# Patient Record
Sex: Female | Born: 1945 | Race: Black or African American | Hispanic: No | Smoking: Current every day smoker
Health system: Southern US, Community
[De-identification: ages and names within clinical notes are randomized; demographics above are authoritative.]

## PROBLEM LIST (undated history)

## (undated) DIAGNOSIS — D510 Vitamin B12 deficiency anemia due to intrinsic factor deficiency: Secondary | ICD-10-CM

## (undated) DIAGNOSIS — I10 Essential (primary) hypertension: Secondary | ICD-10-CM

## (undated) HISTORY — DX: Essential (primary) hypertension: I10

## (undated) HISTORY — DX: Vitamin B12 deficiency anemia due to intrinsic factor deficiency: D51.0

---

## 2016-08-10 ENCOUNTER — Observation Stay (HOSPITAL_COMMUNITY): Payer: Medicare Other

## 2016-08-10 ENCOUNTER — Inpatient Hospital Stay (HOSPITAL_COMMUNITY)
Admission: EM | Admit: 2016-08-10 | Discharge: 2016-08-12 | DRG: 812 | Disposition: A | Discharge status: Home Health | Payer: Medicare Other | Attending: Internal Medicine | Admitting: Internal Medicine

## 2016-08-10 DIAGNOSIS — D510 Vitamin B12 deficiency anemia due to intrinsic factor deficiency: Principal | ICD-10-CM | POA: Diagnosis present

## 2016-08-10 DIAGNOSIS — F17210 Nicotine dependence, cigarettes, uncomplicated: Secondary | ICD-10-CM | POA: Diagnosis present

## 2016-08-10 DIAGNOSIS — D649 Anemia, unspecified: Secondary | ICD-10-CM | POA: Diagnosis present

## 2016-08-10 DIAGNOSIS — M7989 Other specified soft tissue disorders: Secondary | ICD-10-CM | POA: Diagnosis present

## 2016-08-10 DIAGNOSIS — R000 Tachycardia, unspecified: Secondary | ICD-10-CM | POA: Diagnosis present

## 2016-08-10 DIAGNOSIS — Z882 Allergy status to sulfonamides status: Secondary | ICD-10-CM

## 2016-08-10 DIAGNOSIS — Z7982 Long term (current) use of aspirin: Secondary | ICD-10-CM

## 2016-08-10 LAB — IRON AND TIBC
Iron: 139 ug/dL (ref 28–170)
Saturation Ratios: 46 % — ABNORMAL HIGH (ref 10.4–31.8)
TIBC: 300 ug/dL (ref 250–450)
UIBC: 161 ug/dL

## 2016-08-10 LAB — RETICULOCYTES
RBC.: 0.99 MIL/uL — AB (ref 3.87–5.11)
RETIC COUNT ABSOLUTE: 27.7 10*3/uL (ref 19.0–186.0)
RETIC CT PCT: 2.8 % (ref 0.4–3.1)

## 2016-08-10 LAB — TYPE AND SCREEN
ABO/RH(D): O POS
Antibody Screen: NEGATIVE
UNIT DIVISION: 0
UNIT DIVISION: 0
Unit division: 0

## 2016-08-10 LAB — CBC
HEMATOCRIT: 12 % — AB (ref 36.0–46.0)
HEMOGLOBIN: 4 g/dL — AB (ref 12.0–15.0)
MCH: 39.6 pg — ABNORMAL HIGH (ref 26.0–34.0)
MCHC: 33.3 g/dL (ref 30.0–36.0)
MCV: 118.8 fL — ABNORMAL HIGH (ref 78.0–100.0)
Platelets: 101 10*3/uL — ABNORMAL LOW (ref 150–400)
RBC: 1.01 MIL/uL — ABNORMAL LOW (ref 3.87–5.11)
RDW: 35 % — AB (ref 11.5–15.5)
WBC: 6.7 10*3/uL (ref 4.0–10.5)

## 2016-08-10 LAB — URINALYSIS, ROUTINE W REFLEX MICROSCOPIC
BILIRUBIN URINE: NEGATIVE
Glucose, UA: NEGATIVE mg/dL
Hgb urine dipstick: NEGATIVE
KETONES UR: NEGATIVE mg/dL
LEUKOCYTES UA: NEGATIVE
NITRITE: NEGATIVE
Protein, ur: NEGATIVE mg/dL
Specific Gravity, Urine: 1.01 (ref 1.005–1.030)
pH: 6 (ref 5.0–8.0)

## 2016-08-10 LAB — BASIC METABOLIC PANEL
ANION GAP: 8 (ref 5–15)
BUN: 11 mg/dL (ref 6–20)
CALCIUM: 10.3 mg/dL (ref 8.9–10.3)
CO2: 21 mmol/L — ABNORMAL LOW (ref 22–32)
Chloride: 109 mmol/L (ref 101–111)
Creatinine, Ser: 1.09 mg/dL — ABNORMAL HIGH (ref 0.44–1.00)
GFR calc Af Amer: 57 mL/min — ABNORMAL LOW (ref >60)
GFR, EST NON AFRICAN AMERICAN: 49 mL/min — AB (ref >60)
GLUCOSE: 128 mg/dL — AB (ref 65–99)
POTASSIUM: 4.2 mmol/L (ref 3.5–5.1)
SODIUM: 138 mmol/L (ref 135–145)

## 2016-08-10 LAB — PROTIME-INR
INR: 1.07
PROTHROMBIN TIME: 13.9 s (ref 11.4–15.2)

## 2016-08-10 LAB — HEPATIC FUNCTION PANEL
ALBUMIN: 4 g/dL (ref 3.5–5.0)
ALT: 10 U/L — AB (ref 14–54)
AST: 30 U/L (ref 15–41)
Alkaline Phosphatase: 51 U/L (ref 38–126)
BILIRUBIN DIRECT: 0.6 mg/dL — AB (ref 0.1–0.5)
Indirect Bilirubin: 3.5 mg/dL — ABNORMAL HIGH (ref 0.3–0.9)
Total Bilirubin: 4.1 mg/dL — ABNORMAL HIGH (ref 0.3–1.2)
Total Protein: 6.6 g/dL (ref 6.5–8.1)

## 2016-08-10 LAB — BRAIN NATRIURETIC PEPTIDE: B Natriuretic Peptide: 270 pg/mL — ABNORMAL HIGH (ref 0.0–100.0)

## 2016-08-10 LAB — BPAM RBC
BLOOD PRODUCT EXPIRATION DATE: 201808302359
BLOOD PRODUCT EXPIRATION DATE: 201808302359
BLOOD PRODUCT EXPIRATION DATE: 201808312359
ISSUE DATE / TIME: 201808012228
Unit Type and Rh: 5100
Unit Type and Rh: 5100
Unit Type and Rh: 5100

## 2016-08-10 LAB — PREPARE RBC (CROSSMATCH)

## 2016-08-10 LAB — ABO/RH: ABO/RH(D): O POS

## 2016-08-10 LAB — VITAMIN B12: Vitamin B-12: <50 pg/mL — ABNORMAL LOW (ref 180–914)

## 2016-08-10 LAB — I-STAT TROPONIN, ED: Troponin i, poc: 0.04 ng/mL (ref 0.00–0.08)

## 2016-08-10 LAB — ETHANOL

## 2016-08-10 LAB — FOLATE: Folate: 10.9 ng/mL (ref >5.9)

## 2016-08-10 LAB — FERRITIN: FERRITIN: 163 ng/mL (ref 11–307)

## 2016-08-10 LAB — POC OCCULT BLOOD, ED: Fecal Occult Bld: NEGATIVE

## 2016-08-10 LAB — LIPASE, BLOOD: Lipase: 29 U/L (ref 11–51)

## 2016-08-10 MED ORDER — SODIUM CHLORIDE 0.9 % IV SOLN: 10.0000 mL/h | Freq: Once | INTRAVENOUS | Status: DC

## 2016-08-10 MED ORDER — ACETAMINOPHEN 325 MG PO TABS
650.0000 mg | ORAL_TABLET | Freq: Four times a day (QID) | ORAL | Status: DC | PRN
  Administered 2016-08-10: 650 mg via ORAL
  Filled 2016-08-10: qty 2

## 2016-08-10 MED ORDER — ACETAMINOPHEN 650 MG RE SUPP: 650.0000 mg | Freq: Four times a day (QID) | RECTAL | Status: DC | PRN

## 2016-08-10 MED ORDER — SODIUM CHLORIDE 0.9% FLUSH: 3.0000 mL | INTRAVENOUS | Status: DC | PRN

## 2016-08-10 MED ORDER — SODIUM CHLORIDE 0.9% FLUSH
3.0000 mL | Freq: Two times a day (BID) | INTRAVENOUS | Status: DC
  Administered 2016-08-10 – 2016-08-12 (×4): 3 mL via INTRAVENOUS

## 2016-08-10 MED ORDER — SODIUM CHLORIDE 0.9 % IV SOLN: 250.0000 mL | INTRAVENOUS | Status: DC | PRN

## 2016-08-10 NOTE — ED Notes (Signed)
MD at bedside. 

## 2016-08-10 NOTE — H&P (Signed)
History and Physical    Danielle Zamora KWI:[id number] DOB: November 05, 1945 DOA: 08/10/2016  PCP: Patient, No Pcp Per  Patient coming from: Home  Chief Complaint: Weakness upon standing, shortness of breath, elevated heart rate  HPI: Danielle Zamora is a 71 y.o. female with medical history significant of tobacco dependence. Presenting after developing complaints listed above. This has been going on for the last several days. She denies any hematuria or bright red blood per rectum. No abdominal discomfort either. The problem has been persistent and gradually getting worse. She reports that when she went to standing or with activity she would get short of breath and feel her heart rate beating fast. Resting would help.  ED Course: Patient was found to have profound anemia were consulted for further evaluation recommendations.  Review of Systems: As per HPI otherwise 10 point review of systems negative.   No past medical history -  reported patient reported she does not follow up regularly with physicians.  No past surgical history reported   has no tobacco, alcohol, and drug history on file.  Allergies  Allergen Reactions  . Penicillins Swelling    Has patient had a PCN reaction causing immediate rash, facial/tongue/throat swelling, SOB or lightheadedness with hypotension: Yes Has patient had a PCN reaction causing severe rash involving mucus membranes or skin necrosis: Yes Has patient had a PCN reaction that required hospitalization: No Has patient had a PCN reaction occurring within the last 10 years: No If all of the above answers are "NO", then may proceed with Cephalosporin use.     No family history on file.   Prior to Admission medications   Medication Sig Start Date End Date Taking? Authorizing Provider  aspirin EC 325 MG tablet Take 325 mg by mouth daily.   Yes [provider]    Physical Exam: Vitals:   08/10/16 1429 08/10/16 1700 08/10/16 1745  BP: (!) 179/76 (!)  173/63 (!) 162/66  Pulse: (!) 106    Resp: 16 18 18   Temp: 98.6 F (37 C)    TempSrc: Oral    SpO2: 95%      Constitutional: NAD, calm, comfortable Vitals:   08/10/16 1429 08/10/16 1700 08/10/16 1745  BP: (!) 179/76 (!) 173/63 (!) 162/66  Pulse: (!) 106    Resp: 16 18 18   Temp: 98.6 F (37 C)    TempSrc: Oral    SpO2: 95%     Eyes: PERRL, lids and conjunctivae normal. Conjunctival pallor ENMT: Mucous membranes are moist. Posterior pharynx clear of any exudate or lesions.Normal dentition.  Neck: normal, supple, no masses, no thyromegaly Respiratory: clear to auscultation bilaterally, no wheezing, no crackles. Normal respiratory effort. No accessory muscle use.  Cardiovascular: Regular rate and rhythm, no  rubs / gallops. No extremity edema. Abdomen: no tenderness, no masses palpated. No hepatosplenomegaly. Bowel sounds positive.  Musculoskeletal:  No joint deformity upper and lower extremities.  Skin: no rashes, lesions, ulcers. No induration, limited exam Neurologic: Awake and alert, no facial asymmetry, answers questions appropriately    Labs on Admission: I have personally reviewed following labs and imaging studies  CBC:  Recent Labs Lab 08/10/16 1503  WBC 6.7  HGB 4.0*  HCT 12.0*  MCV 118.8*  PLT 242*   Basic Metabolic Panel:  Recent Labs Lab 08/10/16 1503  NA 138  K 4.2  CL 109  CO2 21*  GLUCOSE 128*  BUN 11  CREATININE 1.09*  CALCIUM 10.3   GFR: CrCl cannot be calculated (Unknown ideal  weight.). Liver Function Tests: No results for input(s): AST, ALT, ALKPHOS, BILITOT, PROT, ALBUMIN in the last 168 hours. No results for input(s): LIPASE, AMYLASE in the last 168 hours. No results for input(s): AMMONIA in the last 168 hours. Coagulation Profile:  Recent Labs Lab 08/10/16 1711  INR 1.07   Cardiac Enzymes: No results for input(s): CKTOTAL, CKMB, CKMBINDEX, TROPONINI in the last 168 hours. BNP (last 3 results) No results for input(s): PROBNP  in the last 8760 hours. HbA1C: No results for input(s): HGBA1C in the last 72 hours. CBG: No results for input(s): GLUCAP in the last 168 hours. Lipid Profile: No results for input(s): CHOL, HDL, LDLCALC, TRIG, CHOLHDL, LDLDIRECT in the last 72 hours. Thyroid Function Tests: No results for input(s): TSH, T4TOTAL, FREET4, T3FREE, THYROIDAB in the last 72 hours. Anemia Panel: No results for input(s): VITAMINB12, FOLATE, FERRITIN, TIBC, IRON, RETICCTPCT in the last 72 hours. Urine analysis:    Component Value Date/Time   COLORURINE YELLOW 08/10/2016 1522   APPEARANCEUR CLEAR 08/10/2016 1522   LABSPEC 1.010 08/10/2016 1522   PHURINE 6.0 08/10/2016 1522   GLUCOSEU NEGATIVE 08/10/2016 1522   HGBUR NEGATIVE 08/10/2016 1522   BILIRUBINUR NEGATIVE 08/10/2016 [place] 08/10/2016 1522   PROTEINUR NEGATIVE 08/10/2016 1522   NITRITE NEGATIVE 08/10/2016 1522   LEUKOCYTESUR NEGATIVE 08/10/2016 1522    Radiological Exams on Admission: No results found.  EKG: Independently reviewed. Sinus tachycardia with T-wave inversion in lateral leads  Assessment/Plan Active Problems:   Severe anemia - Etiology uncertain. However it is concerning since patient reports history of smoking and no consistent follow-up with physicians as outpatient. She will most certainly need a colonoscopy. No active bleeding currently. After transfusion I suspect patient continued be discharged follow-up to gastroenterologist. - Place order for anemia panel  DVT prophylaxis: SCD's Code Status: full Family Communication: no family at bedside Disposition Plan: Pending improvement in condition Consults called: None Admission status: Observation   Velvet Bathe MD [facility] Pager [phone]  If 7PM-7AM, please contact night-coverage [email or web address] Password [email or web address]  08/10/2016, 6:09 PM

## 2016-08-10 NOTE — ED Provider Notes (Signed)
[facility] DEPT Provider Note   CSN: [id number] Arrival date & time: 08/10/16  1421     History   Chief Complaint Chief Complaint  Patient presents with  . Weakness  . Memory Loss    HPI Danielle Zamora is a 71 y.o. female.  HPI She lives independently. She has lived in the [place] area for over 10 years. Patient does not seek medical care. She is presenting today with her niece. Her niece does not see her on a regular basis. Her niece advises she saw her probably couple months ago. The patient's niece reports that her uncle, the patient's brother, called her to bring her into the emergency department because the patient had called him to pick her up from [facility] and she seemed weak and somewhat confused. The patient denies any confusion. She does report that for a few weeks to months now she has not been feeling "up to par". She reports normally she is pretty active but lately she is needed to rest more and had less inclination to travel or do activities. She reports that she gets pretty short of breath with what used to be fairly minor exertion. She reports she has to sit and rest a while and then resume her activities. She denies chest pain. Some shortness of breath with exertion. She is also noting that she's developed some swelling in her legs with a little hyperpigmentation. He denies pain in the legs. She denies recent fever or chills. She reports she does smoke regularly. Usually less than a pack a day. She has some amount of alcohol on a daily basis. She reports often is less than a glass but typically something. No past medical history on file.  There are no active problems to display for this patient.   No past surgical history on file.  OB History    No data available       Home Medications    Prior to Admission medications   Medication Sig Start Date End Date Taking? Authorizing Provider  aspirin EC 325 MG tablet Take 325 mg by mouth daily.   Yes [provider]    Family History No family history on file.  Social History Social History  Substance Use Topics  . Smoking status: Not on file  . Smokeless tobacco: Not on file  . Alcohol use Not on file     Allergies   Penicillins   Review of Systems Review of Systems 10 Systems reviewed and are negative for acute change except as noted in the HPI.   Physical Exam Updated Vital Signs BP (!) 179/76 (BP Location: Right Arm)   Pulse (!) 106   Temp 98.6 F (37 C) (Oral)   Resp 16   SpO2 95%   Physical Exam  Constitutional: She is oriented to person, place, and time.  Patient is alert and nontoxic. No respiratory distress at rest. Mental status clear.  HENT:  Head: Normocephalic and atraumatic.  Mouth/Throat: Oropharynx is clear and moist.  Patient wears dentures.  Eyes: Pupils are equal, round, and reactive to light. EOM are normal.  Appearance of mild icterus.  Neck: Neck supple.  Cardiovascular:  Tachycardia. Monitor shows sinus rhythm 102. Systolic ejection murmur 2\6.  Pulmonary/Chest: Effort normal and breath sounds normal.  Abdominal: Soft. Bowel sounds are normal. She exhibits no distension. There is no tenderness. There is no guarding.  Musculoskeletal: Normal range of motion.  Trace to 1+ pitting edema bilateral lower extremities. Calves nontender. Mild hyperpigmentation of  the pretibial surfaces. Skin condition good to fair no ulcers or wounds.  Neurological: She is alert and oriented to person, place, and time. No cranial nerve deficit. She exhibits normal muscle tone. Coordination normal.  Skin: Skin is warm and dry.  Psychiatric: She has a normal mood and affect.     ED Treatments / Results  Labs (all labs ordered are listed, but only abnormal results are displayed) Labs Reviewed  BASIC METABOLIC PANEL - Abnormal; Notable for the following:       Result Value   CO2 21 (*)    Glucose, Bld 128 (*)    Creatinine, Ser 1.09 (*)    GFR calc non Af  Amer 49 (*)    GFR calc Af Amer 57 (*)    All other components within normal limits  CBC - Abnormal; Notable for the following:    RBC 1.01 (*)    Hemoglobin 4.0 (*)    HCT 12.0 (*)    MCV 118.8 (*)    MCH 39.6 (*)    RDW 35.0 (*)    Platelets 101 (*)    All other components within normal limits  URINALYSIS, ROUTINE W REFLEX MICROSCOPIC  ETHANOL  PROTIME-INR  BRAIN NATRIURETIC PEPTIDE  HEPATIC FUNCTION PANEL  LIPASE, BLOOD  POC OCCULT BLOOD, ED  I-STAT TROPONIN, ED  TYPE AND SCREEN  PREPARE RBC (CROSSMATCH)  ABO/RH    EKG  EKG Interpretation  Date/Time:  Wednesday August 10 2016 14:57:09 EDT Ventricular Rate:  102 PR Interval:  150 QRS Duration: 140 QT Interval:  392 QTC Calculation: 510 R Axis:   99 Text Interpretation:  Sinus tachycardia Right bundle branch block T wave abnormality, consider inferolateral ischemia Abnormal ECG agree.no stemi, no old Confirmed by Charlesetta Shanks [phone]) on 08/10/2016 4:46:58 PM       Radiology No results found.  Procedures Procedures (including critical care time) CRITICAL CARE Performed by: Charlesetta Shanks   Total critical care time:30 minutes  Critical care time was exclusive of separately billable procedures and treating other patients.  Critical care was necessary to treat or prevent imminent or life-threatening deterioration.  Critical care was time spent personally by me on the following activities: development of treatment plan with patient and/or surrogate as well as nursing, discussions with consultants, evaluation of patient's response to treatment, examination of patient, obtaining history from patient or surrogate, ordering and performing treatments and interventions, ordering and review of laboratory studies, ordering and review of radiographic studies, pulse oximetry and re-evaluation of patient's condition. Medications Ordered in ED Medications  0.9 %  sodium chloride infusion (not administered)     Initial  Impression / Assessment and Plan / ED Course  I have reviewed the triage vital signs and the nursing notes.  Pertinent labs & imaging results that were available during my care of the patient were reviewed by me and considered in my medical decision making (see chart for details).    Consult hospitalist for admission.  Final Clinical Impressions(s) / ED Diagnoses   Final diagnoses:  Symptomatic anemia   Patient presents with generalized symptoms of increasing fatigue, dyspnea and weakness. She is found to be significantly anemic with a hemoglobin of 4. No evident source of blood loss at this time. She will be admitted for transfusion and further diagnostic evaluation. New Prescriptions New Prescriptions   No medications on file     Charlesetta Shanks, MD 08/10/16 [phone]

## 2016-08-10 NOTE — ED Notes (Signed)
Dr. Vega at bedside. 

## 2016-08-10 NOTE — ED Triage Notes (Signed)
Pt arrives via POv from home with several month hx of fatigue, decreased appetite, difficulty walking and memory loss. Pt alert, oriented x4, no droop, drift or slurred speech noted. Strength equal in all extremites. Denies recent fever, cough, UTI sx. Pt NAD at present. Pt reports no medical hx no PCP in many years.

## 2016-08-11 ENCOUNTER — Encounter (HOSPITAL_COMMUNITY): Payer: Self-pay

## 2016-08-11 DIAGNOSIS — D510 Vitamin B12 deficiency anemia due to intrinsic factor deficiency: Secondary | ICD-10-CM | POA: Diagnosis present

## 2016-08-11 DIAGNOSIS — M7989 Other specified soft tissue disorders: Secondary | ICD-10-CM | POA: Diagnosis present

## 2016-08-11 DIAGNOSIS — F17210 Nicotine dependence, cigarettes, uncomplicated: Secondary | ICD-10-CM | POA: Diagnosis present

## 2016-08-11 DIAGNOSIS — Z882 Allergy status to sulfonamides status: Secondary | ICD-10-CM | POA: Diagnosis not present

## 2016-08-11 DIAGNOSIS — D649 Anemia, unspecified: Secondary | ICD-10-CM

## 2016-08-11 DIAGNOSIS — Z7982 Long term (current) use of aspirin: Secondary | ICD-10-CM | POA: Diagnosis not present

## 2016-08-11 DIAGNOSIS — R000 Tachycardia, unspecified: Secondary | ICD-10-CM | POA: Diagnosis present

## 2016-08-11 LAB — BASIC METABOLIC PANEL
ANION GAP: 8 (ref 5–15)
BUN: 9 mg/dL (ref 6–20)
CO2: 21 mmol/L — ABNORMAL LOW (ref 22–32)
Calcium: 10.1 mg/dL (ref 8.9–10.3)
Chloride: 110 mmol/L (ref 101–111)
Creatinine, Ser: 1.04 mg/dL — ABNORMAL HIGH (ref 0.44–1.00)
GFR calc Af Amer: >60 mL/min (ref >60)
GFR, EST NON AFRICAN AMERICAN: 52 mL/min — AB (ref >60)
GLUCOSE: 105 mg/dL — AB (ref 65–99)
POTASSIUM: 4 mmol/L (ref 3.5–5.1)
Sodium: 139 mmol/L (ref 135–145)

## 2016-08-11 LAB — LACTATE DEHYDROGENASE: LDH: 2399 U/L — ABNORMAL HIGH (ref 98–192)

## 2016-08-11 LAB — CBC
HEMATOCRIT: 23.6 % — AB (ref 36.0–46.0)
HEMOGLOBIN: 7.9 g/dL — AB (ref 12.0–15.0)
MCH: 32.4 pg (ref 26.0–34.0)
MCHC: 33.5 g/dL (ref 30.0–36.0)
MCV: 96.7 fL (ref 78.0–100.0)
Platelets: 83 10*3/uL — ABNORMAL LOW (ref 150–400)
RBC: 2.44 MIL/uL — AB (ref 3.87–5.11)
RDW: 26.7 % — ABNORMAL HIGH (ref 11.5–15.5)
WBC: 3.7 10*3/uL — AB (ref 4.0–10.5)

## 2016-08-11 MED ORDER — AMLODIPINE BESYLATE 2.5 MG PO TABS
2.5000 mg | ORAL_TABLET | Freq: Every day | ORAL | Status: DC
  Administered 2016-08-12: 2.5 mg via ORAL
  Filled 2016-08-11: qty 1

## 2016-08-11 MED ORDER — CYANOCOBALAMIN 1000 MCG/ML IJ SOLN
1000.0000 ug | Freq: Every day | INTRAMUSCULAR | Status: DC
  Administered 2016-08-11 – 2016-08-12 (×2): 1000 ug via INTRAMUSCULAR
  Filled 2016-08-11 (×2): qty 1

## 2016-08-11 MED ORDER — ENOXAPARIN SODIUM 40 MG/0.4ML ~~LOC~~ SOLN: 40.0000 mg | SUBCUTANEOUS | Status: DC

## 2016-08-11 MED ORDER — HYDRALAZINE HCL 20 MG/ML IJ SOLN
10.0000 mg | Freq: Four times a day (QID) | INTRAMUSCULAR | Status: DC | PRN
  Administered 2016-08-11: 10 mg via INTRAVENOUS
  Filled 2016-08-11: qty 1

## 2016-08-11 MED ORDER — AMLODIPINE BESYLATE 2.5 MG PO TABS
2.5000 mg | ORAL_TABLET | Freq: Once | ORAL | Status: AC
  Administered 2016-08-12: 2.5 mg via ORAL
  Filled 2016-08-11: qty 1

## 2016-08-11 NOTE — Progress Notes (Signed)
PROGRESS NOTE    Danielle Zamora  WLN:[id number] DOB: 07-28-1945 DOA: 08/10/2016 PCP: Patient, No Pcp Per    Brief Narrative:   71 year old female with severe macrocytic anemia.   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 Auto populated tables are space occupying and do not give end dates)  NONE      Subjective:  Reports improvement in her shortness of breath and weakness no chest pain no nausea no vomiting no fevers no chills.  Objective: Vitals:   08/11/16 0707 08/11/16 0758 08/11/16 0823 08/11/16 1155  BP: (!) 154/68 (!) 184/83 (!) 191/73 (!) 165/66  Pulse: 80 79 77 67  Resp:  20 20 18   Temp: 99.4 F (37.4 C) 99 F (37.2 C) 97.9 F (36.6 C) 97.8 F (36.6 C)  TempSrc: Oral Oral Oral Oral  SpO2: 100%   100%  Weight:      Height:        Intake/Output Summary (Last 24 hours) at 08/11/16 1307 Last data filed at 08/11/16 1155  Gross per 24 hour  Intake              834 ml  Output                0 ml  Net              834 ml   Filed Weights   08/10/16 2005  Weight: 83 kg (182 lb 15.7 oz)    Examination:  General exam: Appears calm and comfortable . EYES ARE YELLOWISH . Respiratory system: Clear to auscultation. Respiratory effort normal. Cardiovascular system: S1 & S2 heard, RRR. No JVD, murmurs, rubs, gallops or clicks. No pedal edema. Gastrointestinal system: Abdomen is nondistended, soft and  nontender. No organomegaly or masses felt. Normal bowel sounds heard. Central nervous system: Alert and oriented. No focal neurological deficits. Extremities: Symmetric 5 x 5 power. Skin: No rashes, lesions or ulcers Psychiatry: Judgement and insight appear normal. Mood & affect appropriate.     Data Reviewed: I have personally reviewed following labs and imaging studies  CBC:  Recent Labs Lab 08/10/16 1503  WBC 6.7  HGB 4.0*  HCT 12.0*  MCV 118.8*  PLT 740*   Basic Metabolic Panel:  Recent Labs Lab 08/10/16 1503  NA 138  K 4.2  CL 109  CO2 21*  GLUCOSE 128*  BUN 11  CREATININE 1.09*  CALCIUM 10.3   GFR: Estimated Creatinine Clearance: 47.6 mL/min (A) (by C-G formula based on SCr of 1.09 mg/dL (H)). Liver Function Tests:  Recent Labs Lab 08/10/16 1711  AST 30  ALT 10*  ALKPHOS 51  BILITOT 4.1*  PROT 6.6  ALBUMIN 4.0    Recent Labs Lab 08/10/16 1711  LIPASE 29   No results for input(s): AMMONIA in the last 168 hours. Coagulation Profile:  Recent Labs Lab 08/10/16 1711  INR 1.07   Cardiac Enzymes: No results for input(s):  CKTOTAL, CKMB, CKMBINDEX, TROPONINI in the last 168 hours. BNP (last 3 results) No results for input(s): PROBNP in the last 8760 hours. HbA1C: No results for input(s): HGBA1C in the last 72 hours. CBG: No results for input(s): GLUCAP in the last 168 hours. Lipid Profile: No results for input(s): CHOL, HDL, LDLCALC, TRIG, CHOLHDL, LDLDIRECT in the last 72 hours. Thyroid Function Tests: No results for input(s): TSH, T4TOTAL, FREET4, T3FREE, THYROIDAB in the last 72 hours. Anemia Panel:  Recent Labs  08/10/16 2004  VITAMINB12 <50*  FOLATE 10.9  FERRITIN 163  TIBC 300  IRON 139  RETICCTPCT 2.8   Urine analysis:    Component Value Date/Time   COLORURINE YELLOW 08/10/2016 1522   APPEARANCEUR CLEAR 08/10/2016 1522   LABSPEC 1.010 08/10/2016 1522   PHURINE 6.0 08/10/2016 1522   GLUCOSEU NEGATIVE 08/10/2016 1522    HGBUR NEGATIVE 08/10/2016 1522   BILIRUBINUR NEGATIVE 08/10/2016 1522   KETONESUR NEGATIVE 08/10/2016 1522   PROTEINUR NEGATIVE 08/10/2016 1522   NITRITE NEGATIVE 08/10/2016 1522   LEUKOCYTESUR NEGATIVE 08/10/2016 1522   Sepsis Labs: @LABRCNTIP (procalcitonin:4,lacticidven:4)  )No results found for this or any previous visit (from the past 240 hour(s)).       Radiology Studies: Dg Chest 2 View  Result Date: 08/10/2016 CLINICAL DATA:  Initial evaluation for tachycardia, lower extremity swelling. EXAM: CHEST  2 VIEW COMPARISON:  None available. FINDINGS: Moderate cardiomegaly. Mediastinal silhouette within normal limits. Aortic atherosclerosis. Lungs hypoinflated. Perihilar vascular congestion without overt pulmonary edema. Linear opacity at the right lung base most compatible with atelectasis and/or scar. Additional hazy bibasilar opacities, right greater than left, suspected in large part reflect attenuation from overlying soft tissues. No definite focal infiltrates. No pleural effusion. No pneumothorax. No acute osseous abnormality. Multilevel degenerative changes noted within the visualized spine. IMPRESSION: 1. Moderate cardiomegaly with perihilar vascular congestion without overt pulmonary edema. 2. Mild right basilar atelectasis and/or scarring. 3. Additional apparent hazy bibasilar opacities overlying the lung bases on frontal projection, felt largely be secondary to overlying soft tissue attenuation. No definite focal infiltrates identified. Electronically Signed   By: Jeannine Boga M.D.   On: 08/10/2016 22:37        Scheduled Meds: . cyanocobalamin  1,000 mcg Intramuscular Daily  . sodium chloride flush  3 mL Intravenous Q12H   Continuous Infusions: . sodium chloride    . sodium chloride       LOS: 0 days    Time spent: 35 minutes     Waldron Session, MD [facility]   If 7PM-7AM, please contact night-coverage [email or web address] Password [email or web address] 08/11/2016, 1:07  PM

## 2016-08-11 NOTE — Progress Notes (Signed)
Pt's BP was 183/67. Dr. Maudie Mercury notified. Will continue to monitor.

## 2016-08-12 ENCOUNTER — Encounter (HOSPITAL_COMMUNITY): Payer: Self-pay

## 2016-08-12 LAB — TYPE AND SCREEN
ABO/RH(D): O POS
ANTIBODY SCREEN: NEGATIVE
Unit division: 0
Unit division: 0
Unit division: 0

## 2016-08-12 LAB — CBC
HEMATOCRIT: 24.5 % — AB (ref 36.0–46.0)
HEMOGLOBIN: 8.3 g/dL — AB (ref 12.0–15.0)
MCH: 32.4 pg (ref 26.0–34.0)
MCHC: 33.9 g/dL (ref 30.0–36.0)
MCV: 95.7 fL (ref 78.0–100.0)
Platelets: 102 10*3/uL — ABNORMAL LOW (ref 150–400)
RBC: 2.56 MIL/uL — ABNORMAL LOW (ref 3.87–5.11)
RDW: 26.8 % — AB (ref 11.5–15.5)
WBC: 5.1 10*3/uL (ref 4.0–10.5)

## 2016-08-12 LAB — BPAM RBC
BLOOD PRODUCT EXPIRATION DATE: 201808302359
BLOOD PRODUCT EXPIRATION DATE: 201808312359
Blood Product Expiration Date: 201808302359
ISSUE DATE / TIME: 201808020758
ISSUE DATE / TIME: 201808012228
ISSUE DATE / TIME: 201808020337
UNIT TYPE AND RH: 5100
UNIT TYPE AND RH: 5100
Unit Type and Rh: 5100

## 2016-08-12 LAB — LACTATE DEHYDROGENASE: LDH: 2449 U/L — ABNORMAL HIGH (ref 98–192)

## 2016-08-12 MED ORDER — CYANOCOBALAMIN 1000 MCG/ML IJ SOLN: 1000.0000 ug | Freq: Every day | INTRAMUSCULAR | 0 refills | Status: AC

## 2016-08-12 MED ORDER — AMLODIPINE BESYLATE 2.5 MG PO TABS: 5.0000 mg | ORAL_TABLET | Freq: Every day | ORAL | 2 refills | Status: DC

## 2016-08-12 MED ORDER — CYANOCOBALAMIN 1000 MCG/ML IJ SOLN: 1000.0000 ug | INTRAMUSCULAR | 0 refills | Status: DC

## 2016-08-12 MED ORDER — CYANOCOBALAMIN 1000 MCG/ML IJ SOLN: 1000.0000 ug | INTRAMUSCULAR | Status: DC

## 2016-08-12 NOTE — Care Management Note (Signed)
Case Management Note  Patient Details  Name: Danielle Zamora MRN: [id number] Date of Birth: 1945/11/03  Subjective/Objective:        Admitted with macrocytic anemia/vitamin B12 deficiency most likely pernicious anemia.          [name] [place] (Son) Antionio Cookt (Son) Elspeth Cho  (niece)   [phone]      PCP: Alma Friendly NP , [place]  Action/Plan: Plan is to d/c today. Hospital post f/u scheduled for  08/19/2016 @ 8:15 am with Pleas Koch NP, Virgel Manifold.  Expected Discharge Date:     08/12/2016           Expected Discharge Plan:  [place]  In-House Referral:     Discharge planning Services  CM Consult  Post Acute Care Choice:    Choice offered to:  Patient  DME Arranged:    DME Agency:     HH Arranged:  RN [place] Agency:   [facility]  Status of Service:  Completed, signed off  If discussed at [place] of Stay Meetings, dates discussed:    Additional Comments:  Sadae, Arraz[place], RN 08/12/2016, 1:12 PM

## 2016-08-12 NOTE — Progress Notes (Signed)
Danielle Zamora to be D/C'd Home per MD order.  Discussed with the patient and all questions fully answered.  VSS, Skin clean, dry and intact without evidence of skin break down, no evidence of skin tears noted. IV catheter discontinued intact. Site without signs and symptoms of complications. Dressing and pressure applied.  An After Visit Summary was printed and given to the patient. Patient received prescription.  D/c education completed with patient/family including follow up instructions, medication list, d/c activities limitations if indicated, with other d/c instructions as indicated by MD - patient able to verbalize understanding, all questions fully answered.   Patient instructed to return to ED, call 911, or call MD for any changes in condition.   Patient escorted via [place], and D/C home via private auto.  Christoper Fabian Tannar Broker 08/12/2016 4:25 PM

## 2016-08-12 NOTE — Discharge Summary (Signed)
Physician Discharge Summary  Danielle Zamora OMV:[id number] DOB: 03-30-1945 DOA: 08/10/2016  PCP: Patient, No Pcp Per  Admit date: 08/10/2016 Discharge date: 08/12/2016  Admitted From: (Home Disposition:  (Home Recommendations for Outpatient Follow-up:  1. Follow up with PCP in 1 week   Home Health:(YES Equipment/Devices:NO  Discharge Condition: (Stable  CODE STATUS:FULL  Diet recommendation: Heart Healthy   Brief/Interim Summary:  71 year old female with severe macrocytic anemia. 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 She will require daily injections of Vit-b12 for week then weekly for month then maintenance per her PCP follow up.   Discharge Diagnoses:  Active Problems:   Severe anemia   Pernicious anemia    Discharge Instructions  Discharge Instructions    Diet - low sodium heart healthy    Complete by:  As directed    Increase activity slowly    Complete by:  As directed      Allergies as of 08/12/2016      Reactions   Penicillins Swelling   Has patient had a PCN reaction causing immediate rash, facial/tongue/throat swelling, SOB or lightheadedness with hypotension: Yes Has patient had a PCN reaction causing severe rash involving mucus membranes or skin necrosis: Yes Has patient had a PCN reaction that required hospitalization: No Has patient had a PCN reaction occurring within the last 10 years: No If all of the above answers are "NO", then may proceed with Cephalosporin use.      Medication List    STOP taking these medications   aspirin EC 325 MG tablet     TAKE these medications   amLODipine 2.5 MG tablet Commonly known as:  NORVASC Take 2 tablets (5 mg total) by mouth daily.   cyanocobalamin 1000 MCG/ML injection Commonly known as:   (VITAMIN B-12) Inject 1 mL (1,000 mcg total) into the muscle daily.   cyanocobalamin 1000 MCG/ML injection Commonly known as:  (VITAMIN B-12) Inject 1 mL (1,000 mcg total) into the muscle once a week.      Follow-up Information    [facility] Follow [facility].   Why:  home health services arranged, office will call and set up home visits Contact information: [place] [phone]          Allergies  Allergen Reactions  . Penicillins Swelling    Has patient had a PCN reaction causing immediate rash, facial/tongue/throat swelling, SOB or lightheadedness with hypotension: Yes Has patient had a PCN reaction causing severe rash involving mucus membranes or skin necrosis: Yes Has patient had a PCN reaction that required hospitalization: No Has patient had a PCN reaction occurring within the last 10 years: No If all of the above answers are "NO", then may proceed with Cephalosporin use.     Consultations:  None      Procedures/Studies: Dg Chest 2 View  Result Date: 08/10/2016 CLINICAL DATA:  Initial evaluation for tachycardia, lower extremity swelling. EXAM: CHEST  2 VIEW COMPARISON:  None available. FINDINGS: Moderate cardiomegaly. Mediastinal silhouette within normal limits. Aortic atherosclerosis. Lungs hypoinflated. Perihilar vascular congestion without overt pulmonary edema. Linear opacity at the right lung base most compatible with atelectasis and/or scar. Additional hazy bibasilar opacities, right greater than left, suspected in large part reflect attenuation from overlying soft tissues. No definite focal  infiltrates. No pleural effusion. No pneumothorax. No acute osseous abnormality. Multilevel degenerative changes noted within the visualized spine. IMPRESSION: 1. Moderate cardiomegaly with perihilar vascular congestion without overt pulmonary edema. 2. Mild right basilar atelectasis and/or scarring. 3. Additional apparent hazy  bibasilar opacities overlying the lung bases on frontal projection, felt largely be secondary to overlying soft tissue attenuation. No definite focal infiltrates identified. Electronically Signed   By: Jeannine Boga M.D.   On: 08/10/2016 22:37   (Echo, Carotid, EGD, Colonoscopy, ERCP)    Subjective:   Discharge Exam: Vitals:   08/12/16 0331 08/12/16 0952  BP: (!) 165/86 (!) 161/73  Pulse: 77   Resp: 18   Temp: 99.4 F (37.4 C)    Vitals:   08/11/16 2330 08/12/16 0147 08/12/16 0331 08/12/16 0952  BP: (!) 184/79 (!) 156/64 (!) 165/86 (!) 161/73  Pulse: 60 76 77   Resp:   18   Temp:   99.4 F (37.4 C)   TempSrc:   Oral   SpO2:   100%   Weight:      Height:        General: Pt is alert, awake, not in acute distress Cardiovascular: RRR, S1/S2 +, no rubs, no gallops Respiratory: CTA bilaterally, no wheezing, no rhonchi Abdominal: Soft, NT, ND, bowel sounds + Extremities: no edema, no cyanosis    The results of significant diagnostics from this hospitalization (including imaging, microbiology, ancillary and laboratory) are listed below for reference.     Microbiology: No results found for this or any previous visit (from the past 240 hour(s)).   Labs: BNP (last 3 results)  Recent Labs  08/10/16 2004  BNP 665.9*   Basic Metabolic Panel:  Recent Labs Lab 08/10/16 1503 08/11/16 1432  NA 138 139  K 4.2 4.0  CL 109 110  CO2 21* 21*  GLUCOSE 128* 105*  BUN 11 9  CREATININE 1.09* 1.04*  CALCIUM 10.3 10.1   Liver Function Tests:  Recent Labs Lab 08/10/16 1711  AST 30  ALT 10*  ALKPHOS 51  BILITOT 4.1*  PROT 6.6  ALBUMIN 4.0    Recent Labs Lab 08/10/16 1711  LIPASE 29   No results for input(s): AMMONIA in the last 168 hours. CBC:  Recent Labs Lab 08/10/16 1503 08/11/16 1432 08/12/16 0627  WBC 6.7 3.7* 5.1  HGB 4.0* 7.9* 8.3*  HCT 12.0* 23.6* 24.5*  MCV 118.8* 96.7 95.7  PLT 101* 83* 102*   Cardiac Enzymes: No results for  input(s): CKTOTAL, CKMB, CKMBINDEX, TROPONINI in the last 168 hours. BNP: Invalid input(s): POCBNP CBG: No results for input(s): GLUCAP in the last 168 hours. D-Dimer No results for input(s): DDIMER in the last 72 hours. Hgb A1c No results for input(s): HGBA1C in the last 72 hours. Lipid Profile No results for input(s): CHOL, HDL, LDLCALC, TRIG, CHOLHDL, LDLDIRECT in the last 72 hours. Thyroid function studies No results for input(s): TSH, T4TOTAL, T3FREE, THYROIDAB in the last 72 hours.  Invalid input(s): FREET3 Anemia work up  Recent Labs  08/10/16 2004  VITAMINB12 <50*  FOLATE 10.9  FERRITIN 163  TIBC 300  IRON 139  RETICCTPCT 2.8   Urinalysis    Component Value Date/Time   COLORURINE YELLOW 08/10/2016 1522   APPEARANCEUR CLEAR 08/10/2016 1522   LABSPEC 1.010 08/10/2016 1522   PHURINE 6.0 08/10/2016 1522   GLUCOSEU NEGATIVE 08/10/2016 1522   HGBUR NEGATIVE 08/10/2016 1522   BILIRUBINUR NEGATIVE 08/10/2016 1522   KETONESUR NEGATIVE 08/10/2016 1522   PROTEINUR NEGATIVE  08/10/2016 1522   NITRITE NEGATIVE 08/10/2016 1522   LEUKOCYTESUR NEGATIVE 08/10/2016 1522   Sepsis Labs Invalid input(s): PROCALCITONIN,  WBC,  LACTICIDVEN Microbiology No results found for this or any previous visit (from the past 240 hour(s)).   Time coordinating discharge: Over 30 minutes  SIGNED:   Waldron Session, MD  [facility] 08/12/2016, 1:45 PM Pager   If 7PM-7AM, please contact night-coverage [email or web address] Password [email or web address]

## 2016-08-19 ENCOUNTER — Ambulatory Visit: Payer: Medicare Other | Admitting: Primary Care

## 2016-08-31 ENCOUNTER — Ambulatory Visit (INDEPENDENT_AMBULATORY_CARE_PROVIDER_SITE_OTHER): Payer: TRICARE For Life (TFL) | Admitting: Primary Care

## 2016-08-31 ENCOUNTER — Encounter: Payer: Self-pay | Admitting: Primary Care

## 2016-08-31 VITALS — BP 168/104 | HR 97 | Temp 98.0°F | Ht 63.0 in | Wt 185.5 lb

## 2016-08-31 DIAGNOSIS — D510 Vitamin B12 deficiency anemia due to intrinsic factor deficiency: Secondary | ICD-10-CM | POA: Diagnosis not present

## 2016-08-31 DIAGNOSIS — E538 Deficiency of other specified B group vitamins: Secondary | ICD-10-CM | POA: Diagnosis not present

## 2016-08-31 DIAGNOSIS — D649 Anemia, unspecified: Secondary | ICD-10-CM

## 2016-08-31 DIAGNOSIS — I10 Essential (primary) hypertension: Secondary | ICD-10-CM

## 2016-08-31 LAB — CBC
HCT: 36 % (ref 36.0–46.0)
Hemoglobin: 11.4 g/dL — ABNORMAL LOW (ref 12.0–15.0)
MCHC: 31.8 g/dL (ref 30.0–36.0)
MCV: 97.5 fl (ref 78.0–100.0)
Platelets: 290 10*3/uL (ref 150.0–400.0)
RBC: 3.69 Mil/uL — AB (ref 3.87–5.11)
RDW: 19.9 % — AB (ref 11.5–15.5)
WBC: 7.5 10*3/uL (ref 4.0–10.5)

## 2016-08-31 LAB — VITAMIN B12: Vitamin B-12: 956 pg/mL — ABNORMAL HIGH (ref 211–911)

## 2016-08-31 MED ORDER — AMLODIPINE BESYLATE 10 MG PO TABS: 10.0000 mg | ORAL_TABLET | Freq: Every day | ORAL | 0 refills | Status: DC

## 2016-08-31 NOTE — Assessment & Plan Note (Signed)
No prior history, diagnosed during recent hospital stay. BP uncontrolled on 5 mg of amlodipine daily. Increase dose to 10 mg once daily. Follow up in 2 weeks for BP check.

## 2016-08-31 NOTE — Patient Instructions (Signed)
Complete lab work prior to leaving today. I will notify you of your results once received.   Stop taking amlodipine 2.5 mg tablets.  Start amlodipine 10 mg tablets. Take 1 tablet by mouth once daily.  Continue with weekly Vitamin B 12 injections with the last injection being September 6th. Your next injection will be October 4th which is four weeks from September 6th. Please call me when you need refills.  Schedule a follow up visit in 2-3 weeks for recheck of your blood pressure.  It was a pleasure to meet you today! Please don't hesitate to call me with any questions. Welcome to [facility]!

## 2016-08-31 NOTE — Progress Notes (Signed)
Subjective:    Patient ID: Danielle Zamora, female    DOB: [birth year], 71 y.o.   MRN: [id number]  HPI  Danielle Zamora is a 71 year old female who presents today to establish care and discuss the problems mentioned below. Will obtain old records.  1) Hospital Follow Up: History of tobacco abuse.  Presented to [facility] on 08/10/16 with a chief complaint of weakness and memory loss. She lives alone and presented to the emergency department with her niece who reports she seemed weak and confused after an earlier encounter with the patient's brother. She'd not been feeling well for months including fatigue, shortness of breath, lower extremity edema, painful lower extremities.    During her stay in the emergency department she was found to be severely anemic with a hemoglobin of 4. There was no obvious source of bleeding so she was admitted for further work up.  During her hospital stay she was diagnosed with pernicious anemia due to macrocytic cells and low vitamin B 12. She was transfused with three units of packed red blood cells. She was initiated on IM B 12 injections daily with a plan of daily injections x 1 week, then weekly injections x 1 month, then maintenance. She was discharged home on 08/12/16 with home health referral.   Since her hospital stay she's feeling much better. She's completed the daily dose of her Vitamin B 12 injections and has also completed one weekly injection on 08/25/16 and is due again on 09/01/16. Home health called to check up on her. Her friend is administering these injections for her.  2) Essential Hypertension; Currently managed on amlodipine 5 mg that was initiated for hypertension noted during her hospital stay in early August 2018. She's not checking her blood pressure at home. She denies chest pain, dizziness, shortness of breath.   BP Readings from Last 3 Encounters:  08/12/16 (!) 161/73     Review of Systems  Constitutional: Negative for fatigue.  Respiratory:  Negative for shortness of breath.   Cardiovascular: Negative for chest pain and palpitations.  Gastrointestinal: Negative for abdominal pain, blood in stool, constipation, diarrhea and nausea.  Neurological: Negative for dizziness, weakness and headaches.       Past Medical History:  Diagnosis Date  . Essential hypertension   . Pernicious anemia      Social History   Social History  . Marital status: Widowed    Spouse name: N/A  . Number of children: N/A  . Years of education: N/A   Occupational History  . Not on file.   Social History Main Topics  . Smoking status: Current Every Day Smoker    Packs/day: 0.50    Years: 40.00    Types: Cigarettes  . Smokeless tobacco: Never Used  . Alcohol use 1.2 oz/week    2 Shots of liquor per week     Comment: daily  . Drug use: Yes    Types: Marijuana     Comment: use it rarely  . Sexual activity: Not on file   Other Topics Concern  . Not on file   Social History Narrative   Widow.   3 children, 9 grandchildren   Retired.    Moved from [place].   Enjoys spending time with family.    No past surgical history on file.  Family History  Problem Relation Age of Onset  . Kidney disease Mother   . Breast cancer Sister     Allergies  Allergen Reactions  . Penicillins  Swelling    Has patient had a PCN reaction causing immediate rash, facial/tongue/throat swelling, SOB or lightheadedness with hypotension: Yes Has patient had a PCN reaction causing severe rash involving mucus membranes or skin necrosis: Yes Has patient had a PCN reaction that required hospitalization: No Has patient had a PCN reaction occurring within the last 10 years: No If all of the above answers are "NO", then may proceed with Cephalosporin use.     Current Outpatient Prescriptions on File Prior to Visit  Medication Sig Dispense Refill  . cyanocobalamin (,VITAMIN B-12,) 1000 MCG/ML injection Inject 1 mL (1,000 mcg total) into the muscle once a  week. 4 mL 0   No current facility-administered medications on file prior to visit.     BP (!) 168/104   Pulse 97   Temp 98 F (36.7 C) (Oral)   Ht 5\' 3"  (1.6 m)   Wt 185 lb 8 oz (84.1 kg)   SpO2 99%   BMI 32.86 kg/m    Objective:   Physical Exam  Constitutional: She is oriented to person, place, and time. She appears well-nourished.  Neck: Neck supple.  Cardiovascular: Normal rate and regular rhythm.   Pulmonary/Chest: Effort normal and breath sounds normal.  Neurological: She is alert and oriented to person, place, and time.  Skin: Skin is warm and dry.  Psychiatric: She has a normal mood and affect.          Assessment & Plan:  Hospital Follow Up:  Admitted to [facility] on 08/10/16 for severe anemia. Treated with transfusion of PRBC.  Anemia assumed to be pernicious given B 12 deficiency and macrocytic cells. Check CBC, intrinsic factor, B 12 today. Continue with weekly B 12 injections with last one being September 6th, discussed monthly injections thereafter.  All hospital notes and labs reviewed.  Sheral Flow, NP

## 2016-08-31 NOTE — Assessment & Plan Note (Addendum)
Check B 12, CBC, Intrinsic factor antibody today. She is compliant to her B 12 injections, discussed the schedule for administration. She will start with monthly injections on October 4th. She will call when she's needing a refill.

## 2016-09-02 ENCOUNTER — Other Ambulatory Visit: Payer: Self-pay | Admitting: *Deleted

## 2016-09-02 LAB — INTRINSIC FACTOR ANTIBODIES: Intrinsic Factor: NEGATIVE

## 2016-09-02 MED ORDER — "SYRINGE/NEEDLE (DISP) 25G X 5/8"" 3 ML MISC": 2 refills | Status: DC

## 2016-09-02 MED ORDER — CYANOCOBALAMIN 1000 MCG/ML IJ SOLN: 1000.0000 ug | INTRAMUSCULAR | 2 refills | Status: DC

## 2016-09-02 NOTE — Addendum Note (Signed)
Addended by: Jacqualin Combes on: 09/02/2016 10:59 AM   Modules accepted: Orders

## 2016-09-14 ENCOUNTER — Encounter: Payer: Self-pay | Admitting: Primary Care

## 2016-09-14 ENCOUNTER — Ambulatory Visit (INDEPENDENT_AMBULATORY_CARE_PROVIDER_SITE_OTHER): Payer: TRICARE For Life (TFL) | Admitting: Primary Care

## 2016-09-14 DIAGNOSIS — I10 Essential (primary) hypertension: Secondary | ICD-10-CM | POA: Diagnosis not present

## 2016-09-14 DIAGNOSIS — D510 Vitamin B12 deficiency anemia due to intrinsic factor deficiency: Secondary | ICD-10-CM | POA: Diagnosis not present

## 2016-09-14 MED ORDER — LISINOPRIL 20 MG PO TABS: ORAL_TABLET | ORAL | 0 refills | Status: DC

## 2016-09-14 MED ORDER — AMLODIPINE BESYLATE 10 MG PO TABS: ORAL_TABLET | ORAL | 3 refills | Status: DC

## 2016-09-14 NOTE — Assessment & Plan Note (Signed)
Little improvement with increase in Amlodipine to 10 mg. Will add in Lisinopril 20 mg once daily. Will have her follow up in the office in 2 weeks for re-evaluation and BMP.

## 2016-09-14 NOTE — Progress Notes (Signed)
Subjective:    Patient ID: Danielle Zamora, female    DOB: Jul 09, 1945, 71 y.o.   MRN: [id number]  HPI  Danielle Zamora is a 71 year old female who presents today for follow up of hypertension.  Currently managed on Amlodipine 10 mg which is an increase from her prior dose of 5 mg. Her dose was increased to 10 mg several weeks ago due to uncontrolled levels.  Her BP in the office today is 156/100. She's not checking her blood pressure at home. She denies headaches, dizziness, visual changes.   BP Readings from Last 3 Encounters:  09/14/16 (!) 156/100  08/31/16 (!) 168/104  08/12/16 (!) 161/73     Review of Systems  Constitutional: Negative for fatigue.  Eyes: Negative for visual disturbance.  Respiratory: Negative for shortness of breath.   Cardiovascular: Negative for chest pain.  Neurological: Negative for dizziness.       Past Medical History:  Diagnosis Date  . Essential hypertension   . Pernicious anemia      Social History   Social History  . Marital status: Widowed    Spouse name: N/A  . Number of children: N/A  . Years of education: N/A   Occupational History  . Not on file.   Social History Main Topics  . Smoking status: Current Every Day Smoker    Packs/day: 0.50    Years: 40.00    Types: Cigarettes  . Smokeless tobacco: Never Used  . Alcohol use 1.2 oz/week    2 Shots of liquor per week     Comment: daily  . Drug use: Yes    Types: Marijuana     Comment: use it rarely  . Sexual activity: Not on file   Other Topics Concern  . Not on file   Social History Narrative   Widow.   3 children, 9 grandchildren   Retired.    Moved from [place].   Enjoys spending time with family.    No past surgical history on file.  Family History  Problem Relation Age of Onset  . Kidney disease Mother   . Breast cancer Sister     Allergies  Allergen Reactions  . Penicillins Swelling    Has patient had a PCN reaction causing immediate rash,  facial/tongue/throat swelling, SOB or lightheadedness with hypotension: Yes Has patient had a PCN reaction causing severe rash involving mucus membranes or skin necrosis: Yes Has patient had a PCN reaction that required hospitalization: No Has patient had a PCN reaction occurring within the last 10 years: No If all of the above answers are "NO", then may proceed with Cephalosporin use.     Current Outpatient Prescriptions on File Prior to Visit  Medication Sig Dispense Refill  . cyanocobalamin (,VITAMIN B-12,) 1000 MCG/ML injection Inject 1 mL (1,000 mcg total) into the muscle once a week. (Patient taking differently: Inject 1,000 mcg into the muscle every 30 (thirty) days. ) 4 mL 2  . SYRINGE-NEEDLE, DISP, 3 ML (B-D SYRINGE/NEEDLE 3CC/25GX5/8) 25G X 5/8" 3 ML MISC Use as instructed to inject Vitamin B12 once a week (Patient taking differently: Use as instructed to inject Vitamin B12 once a month) 50 each 2   No current facility-administered medications on file prior to visit.     BP (!) 156/100   Pulse (!) 103   Temp 98.3 F (36.8 C) (Oral)   Wt 185 lb 1.9 oz (84 kg)   SpO2 98%   BMI 32.79 kg/m    Objective:  Physical Exam  Constitutional: She appears well-nourished.  Neck: Neck supple.  Cardiovascular: Normal rate and regular rhythm.   Pulmonary/Chest: Effort normal and breath sounds normal.  Skin: Skin is warm and dry.          Assessment & Plan:

## 2016-09-14 NOTE — Assessment & Plan Note (Signed)
Has not injected B 12 in 2 weeks. Will have her restart and administer once weekly for 4 weeks, then re-evaluate.

## 2016-09-14 NOTE — Patient Instructions (Signed)
Continue taking Amlodipine 10 mg tablets for high blood pressure. I sent refills to your pharmacy.  Start taking Lisinopril 20 mg tablets for high blood pressure. Take 1 tablet by mouth every day.   Re-start Vitamin B 12 injections. Administer 1 injection once weekly until your bottles are gone. We will recheck your B 12 in 2 months.   Schedule a follow up visit in 2 weeks to recheck your blood pressure.  It was a pleasure to see you today!

## 2016-09-28 ENCOUNTER — Encounter: Payer: Self-pay | Admitting: Primary Care

## 2016-09-28 ENCOUNTER — Other Ambulatory Visit: Payer: Self-pay | Admitting: Primary Care

## 2016-09-28 ENCOUNTER — Ambulatory Visit (INDEPENDENT_AMBULATORY_CARE_PROVIDER_SITE_OTHER): Payer: TRICARE For Life (TFL) | Admitting: Primary Care

## 2016-09-28 VITALS — BP 180/94 | HR 102 | Temp 98.6°F | Ht 63.0 in | Wt 185.0 lb

## 2016-09-28 DIAGNOSIS — I10 Essential (primary) hypertension: Secondary | ICD-10-CM | POA: Diagnosis not present

## 2016-09-28 DIAGNOSIS — R739 Hyperglycemia, unspecified: Secondary | ICD-10-CM

## 2016-09-28 LAB — TSH: TSH: 1.27 u[IU]/mL (ref 0.35–4.50)

## 2016-09-28 LAB — CBC
HCT: 45.2 % (ref 36.0–46.0)
HEMOGLOBIN: 14.5 g/dL (ref 12.0–15.0)
MCHC: 32.1 g/dL (ref 30.0–36.0)
MCV: 94.8 fl (ref 78.0–100.0)
PLATELETS: 221 10*3/uL (ref 150.0–400.0)
RBC: 4.77 Mil/uL (ref 3.87–5.11)
RDW: 17.3 % — ABNORMAL HIGH (ref 11.5–15.5)
WBC: 6.3 10*3/uL (ref 4.0–10.5)

## 2016-09-28 LAB — COMPREHENSIVE METABOLIC PANEL
ALT: 9 U/L (ref 0–35)
AST: 14 U/L (ref 0–37)
Albumin: 4.2 g/dL (ref 3.5–5.2)
Alkaline Phosphatase: 80 U/L (ref 39–117)
BUN: 10 mg/dL (ref 6–23)
CO2: 26 mEq/L (ref 19–32)
Calcium: 11 mg/dL — ABNORMAL HIGH (ref 8.4–10.5)
Chloride: 109 mEq/L (ref 96–112)
Creatinine, Ser: 1.03 mg/dL (ref 0.40–1.20)
GFR: 67.68 mL/min (ref >60.00)
GLUCOSE: 129 mg/dL — AB (ref 70–99)
POTASSIUM: 4.2 meq/L (ref 3.5–5.1)
Sodium: 141 mEq/L (ref 135–145)
Total Bilirubin: 0.8 mg/dL (ref 0.2–1.2)
Total Protein: 7.6 g/dL (ref 6.0–8.3)

## 2016-09-28 LAB — HEMOGLOBIN A1C: Hgb A1c MFr Bld: 4.6 % (ref 4.6–6.5)

## 2016-09-28 MED ORDER — CHLORTHALIDONE 25 MG PO TABS: 25.0000 mg | ORAL_TABLET | Freq: Every day | ORAL | 0 refills | Status: DC

## 2016-09-28 NOTE — Assessment & Plan Note (Signed)
BP even higher than it was last visit. Called the pharmacy to verify that she's picked up both medications, confirmed this was true. Will check labs today for any evidence of secondary causes of uncontrolled hypertension. Strongly recommended she start checking BP at home, she will do this. Consider adding HCTZ to regimen if labs unremarkable.

## 2016-09-28 NOTE — Progress Notes (Signed)
Subjective:    Patient ID: Danielle Zamora, female    DOB: 1945/03/23, 71 y.o.   MRN: [id number]  HPI  Danielle Zamora is a 71 year old female who presents today for follow up of hypertension. She is currently managed on Amlodipine 10 mg and Lisinopril 20 mg. Lisinopril 20 mg was added during her last visit given little improvement in blood pressure readings after an increase in Amlodipine.  Her BP in the office today 186/108. She's not checked her blood pressure since we last met. She denies chest pain, dizziness, shortness of breath. She endorses compliance to both medications.   Diet currently consists of:  Breakfast: Sausage, english muffin, bacon, eggs, toast Lunch: Skips due to late breakfast Dinner: Chicken, fast food, vegetables, sometimes canned food and frozen food. Snacks: Potato chips, Ritz peanut butter crackers Desserts: 1-2 times weekly. Beverages: Water, cocktail at night most days of the week, Sprite  Exercise: No exercise   Review of Systems  Constitutional: Negative for fatigue.  Respiratory: Negative for shortness of breath.   Cardiovascular: Negative for chest pain.  Neurological: Negative for dizziness and headaches.       Past Medical History:  Diagnosis Date  . Essential hypertension   . Pernicious anemia      Social History   Social History  . Marital status: Widowed    Spouse name: N/A  . Number of children: N/A  . Years of education: N/A   Occupational History  . Not on file.   Social History Main Topics  . Smoking status: Current Every Day Smoker    Packs/day: 0.50    Years: 40.00    Types: Cigarettes  . Smokeless tobacco: Never Used  . Alcohol use 1.2 oz/week    2 Shots of liquor per week     Comment: daily  . Drug use: Yes    Types: Marijuana     Comment: use it rarely  . Sexual activity: Not on file   Other Topics Concern  . Not on file   Social History Narrative   Widow.   3 children, 9 grandchildren   Retired.    Moved from  [place].   Enjoys spending time with family.    No past surgical history on file.  Family History  Problem Relation Age of Onset  . Kidney disease Mother   . Breast cancer Sister     Allergies  Allergen Reactions  . Penicillins Swelling    Has patient had a PCN reaction causing immediate rash, facial/tongue/throat swelling, SOB or lightheadedness with hypotension: Yes Has patient had a PCN reaction causing severe rash involving mucus membranes or skin necrosis: Yes Has patient had a PCN reaction that required hospitalization: No Has patient had a PCN reaction occurring within the last 10 years: No If all of the above answers are "NO", then may proceed with Cephalosporin use.     Current Outpatient Prescriptions on File Prior to Visit  Medication Sig Dispense Refill  . amLODipine (NORVASC) 10 MG tablet Take 1 tablet by mouth daily for high blood pressure. 90 tablet 3  . cyanocobalamin (,VITAMIN B-12,) 1000 MCG/ML injection Inject 1 mL (1,000 mcg total) into the muscle once a week. (Patient taking differently: Inject 1,000 mcg into the muscle every 30 (thirty) days. ) 4 mL 2  . lisinopril (PRINIVIL,ZESTRIL) 20 MG tablet Take 1 tablet by mouth once daily for high blood pressure. 30 tablet 0  . SYRINGE-NEEDLE, DISP, 3 ML (B-D SYRINGE/NEEDLE 3CC/25GX5/8) 25G X 5/8"  3 ML MISC Use as instructed to inject Vitamin B12 once a week (Patient taking differently: Use as instructed to inject Vitamin B12 once a month) 50 each 2   No current facility-administered medications on file prior to visit.     BP (!) 186/108   Pulse (!) 102   Temp 98.6 F (37 C) (Oral)   Ht '5\' 3"'  (1.6 m)   Wt 185 lb (83.9 kg)   SpO2 99%   BMI 32.77 kg/m    Objective:   Physical Exam  Constitutional: She appears well-nourished.  Neck: Neck supple.  Cardiovascular: Normal rate and regular rhythm.   Pulmonary/Chest: Effort normal and breath sounds normal.  Skin: Skin is warm and dry.          Assessment  & Plan:

## 2016-09-28 NOTE — Patient Instructions (Addendum)
Complete lab work prior to leaving today. I will notify you of your results once received.   Continue lisinopril 20 mg and amlodipine 10 mg tablets for high blood pressure. I will be in touch with you regarding any changes once I receive your lab results.   Start checking your blood pressure at home as discussed. Check your blood pressure daily, around the same time of day, for the next 2 weeks.  Ensure that you have rested for 30 minutes prior to checking your blood pressure. Record your readings and bring them to your next visit.  Follow up in 2 weeks for re-evaluation.    DASH Eating Plan DASH stands for "Dietary Approaches to Stop Hypertension." The DASH eating plan is a healthy eating plan that has been shown to reduce high blood pressure (hypertension). It may also reduce your risk for type 2 diabetes, heart disease, and stroke. The DASH eating plan may also help with weight loss. What are tips for following this plan? General guidelines  Avoid eating more than 2,300 mg (milligrams) of salt (sodium) a day. If you have hypertension, you may need to reduce your sodium intake to 1,500 mg a day.  Limit alcohol intake to no more than 1 drink a day for nonpregnant women and 2 drinks a day for men. One drink equals 12 oz of beer, 5 oz of wine, or 1 oz of hard liquor.  Work with your health care provider to maintain a healthy body weight or to lose weight. Ask what an ideal weight is for you.  Get at least 30 minutes of exercise that causes your heart to beat faster (aerobic exercise) most days of the week. Activities may include walking, swimming, or biking.  Work with your health care provider or diet and nutrition specialist (dietitian) to adjust your eating plan to your individual calorie needs. Reading food labels  Check food labels for the amount of sodium per serving. Choose foods with less than 5 percent of the Daily Value of sodium. Generally, foods with less than 300 mg of sodium  per serving fit into this eating plan.  To find whole grains, look for the word "whole" as the first word in the ingredient list. Shopping  Buy products labeled as "low-sodium" or "no salt added."  Buy fresh foods. Avoid canned foods and premade or frozen meals. Cooking  Avoid adding salt when cooking. Use salt-free seasonings or herbs instead of table salt or sea salt. Check with your health care provider or pharmacist before using salt substitutes.  Do not fry foods. Cook foods using healthy methods such as baking, boiling, grilling, and broiling instead.  Cook with heart-healthy oils, such as olive, canola, soybean, or sunflower oil. Meal planning   Eat a balanced diet that includes: ? 5 or more servings of fruits and vegetables each day. At each meal, try to fill half of your plate with fruits and vegetables. ? Up to 6-8 servings of whole grains each day. ? Less than 6 oz of lean meat, poultry, or fish each day. A 3-oz serving of meat is about the same size as a deck of cards. One egg equals 1 oz. ? 2 servings of low-fat dairy each day. ? A serving of nuts, seeds, or beans 5 times each week. ? Heart-healthy fats. Healthy fats called Omega-3 fatty acids are found in foods such as flaxseeds and coldwater fish, like sardines, salmon, and mackerel.  Limit how much you eat of the following: ? Canned or  prepackaged foods. ? Food that is high in trans fat, such as fried foods. ? Food that is high in saturated fat, such as fatty meat. ? Sweets, desserts, sugary drinks, and other foods with added sugar. ? Full-fat dairy products.  Do not salt foods before eating.  Try to eat at least 2 vegetarian meals each week.  Eat more home-cooked food and less restaurant, buffet, and fast food.  When eating at a restaurant, ask that your food be prepared with less salt or no salt, if possible. What foods are recommended? The items listed may not be a complete list. Talk with your dietitian  about what dietary choices are best for you. Grains Whole-grain or whole-wheat bread. Whole-grain or whole-wheat pasta. Brown rice. [place]. Bulgur. Whole-grain and low-sodium cereals. Pita bread. Low-fat, low-sodium crackers. Whole-wheat flour tortillas. Vegetables Fresh or frozen vegetables (raw, steamed, roasted, or grilled). Low-sodium or reduced-sodium tomato and vegetable juice. Low-sodium or reduced-sodium tomato sauce and tomato paste. Low-sodium or reduced-sodium canned vegetables. Fruits All fresh, dried, or frozen fruit. Canned fruit in natural juice (without added sugar). Meat and other protein foods Skinless chicken or [place]. Ground chicken or [place]. Pork with fat trimmed off. Fish and seafood. Egg whites. Dried beans, peas, or lentils. Unsalted nuts, nut butters, and seeds. Unsalted canned beans. Lean cuts of beef with fat trimmed off. Low-sodium, lean deli meat. Dairy Low-fat (1%) or fat-free (skim) milk. Fat-free, low-fat, or reduced-fat cheeses. Nonfat, low-sodium ricotta or cottage cheese. Low-fat or nonfat yogurt. Low-fat, low-sodium cheese. Fats and oils Soft margarine without trans fats. Vegetable oil. Low-fat, reduced-fat, or light mayonnaise and salad dressings (reduced-sodium). Canola, safflower, olive, soybean, and sunflower oils. Avocado. Seasoning and other foods Herbs. Spices. Seasoning mixes without salt. Unsalted popcorn and pretzels. Fat-free sweets. What foods are not recommended? The items listed may not be a complete list. Talk with your dietitian about what dietary choices are best for you. Grains Baked goods made with fat, such as croissants, muffins, or some breads. Dry pasta or rice meal packs. Vegetables Creamed or fried vegetables. Vegetables in a cheese sauce. Regular canned vegetables (not low-sodium or reduced-sodium). Regular canned tomato sauce and paste (not low-sodium or reduced-sodium). Regular tomato and vegetable juice (not low-sodium or  reduced-sodium). Angie Fava. Olives. Fruits Canned fruit in a light or heavy syrup. Fried fruit. Fruit in cream or butter sauce. Meat and other protein foods Fatty cuts of meat. Ribs. Fried meat. Berniece Salines. Sausage. Bologna and other processed lunch meats. Salami. Fatback. Hotdogs. Bratwurst. Salted nuts and seeds. Canned beans with added salt. Canned or smoked fish. Whole eggs or egg yolks. Chicken or [place] with skin. Dairy Whole or 2% milk, cream, and half-and-half. Whole or full-fat cream cheese. Whole-fat or sweetened yogurt. Full-fat cheese. Nondairy creamers. Whipped toppings. Processed cheese and cheese spreads. Fats and oils Butter. Stick margarine. Lard. Shortening. Ghee. Bacon fat. Tropical oils, such as coconut, palm kernel, or palm oil. Seasoning and other foods Salted popcorn and pretzels. Onion salt, garlic salt, seasoned salt, table salt, and sea salt. Worcestershire sauce. Tartar sauce. Barbecue sauce. Teriyaki sauce. Soy sauce, including reduced-sodium. Steak sauce. Canned and packaged gravies. Fish sauce. Oyster sauce. Cocktail sauce. Horseradish that you find on the shelf. Ketchup. Mustard. Meat flavorings and tenderizers. Bouillon cubes. Hot sauce and Tabasco sauce. Premade or packaged marinades. Premade or packaged taco seasonings. Relishes. Regular salad dressings. Where to find more information:  National Heart, Lung, and [place]: https://wilson-eaton.com/  American Heart Association: www.heart.org Summary  The DASH  eating plan is a healthy eating plan that has been shown to reduce high blood pressure (hypertension). It may also reduce your risk for type 2 diabetes, heart disease, and stroke.  With the DASH eating plan, you should limit salt (sodium) intake to 2,300 mg a day. If you have hypertension, you may need to reduce your sodium intake to 1,500 mg a day.  When on the DASH eating plan, aim to eat more fresh fruits and vegetables, whole grains, lean proteins, low-fat  dairy, and heart-healthy fats.  Work with your health care provider or diet and nutrition specialist (dietitian) to adjust your eating plan to your individual calorie needs. This information is not intended to replace advice given to you by your health care provider. Make sure you discuss any questions you have with your health care provider. Document Released: 12/16/2010 Document Revised: 12/21/2015 Document Reviewed: 12/21/2015 Elsevier Interactive Patient Education  2017 [employer].

## 2016-10-12 ENCOUNTER — Other Ambulatory Visit: Payer: Self-pay | Admitting: Primary Care

## 2016-10-12 DIAGNOSIS — I10 Essential (primary) hypertension: Secondary | ICD-10-CM

## 2016-10-17 ENCOUNTER — Ambulatory Visit: Payer: Medicare Other | Admitting: Primary Care

## 2016-10-21 ENCOUNTER — Ambulatory Visit (INDEPENDENT_AMBULATORY_CARE_PROVIDER_SITE_OTHER): Payer: Self-pay | Admitting: Primary Care

## 2016-10-21 ENCOUNTER — Encounter: Payer: Self-pay | Admitting: Primary Care

## 2016-10-21 VITALS — BP 162/102 | HR 67 | Temp 97.9°F | Ht 63.0 in | Wt 182.1 lb

## 2016-10-21 DIAGNOSIS — E538 Deficiency of other specified B group vitamins: Secondary | ICD-10-CM

## 2016-10-21 DIAGNOSIS — I10 Essential (primary) hypertension: Secondary | ICD-10-CM

## 2016-10-21 LAB — BASIC METABOLIC PANEL
BUN: 16 mg/dL (ref 6–23)
CO2: 28 mEq/L (ref 19–32)
CREATININE: 1.08 mg/dL (ref 0.40–1.20)
Calcium: 11.1 mg/dL — ABNORMAL HIGH (ref 8.4–10.5)
Chloride: 106 mEq/L (ref 96–112)
GFR: 64.06 mL/min (ref >60.00)
GLUCOSE: 99 mg/dL (ref 70–99)
POTASSIUM: 4 meq/L (ref 3.5–5.1)
Sodium: 142 mEq/L (ref 135–145)

## 2016-10-21 LAB — VITAMIN B12: Vitamin B-12: 674 pg/mL (ref 211–911)

## 2016-10-21 MED ORDER — LOSARTAN POTASSIUM 100 MG PO TABS: 100.0000 mg | ORAL_TABLET | Freq: Every day | ORAL | 0 refills | Status: DC

## 2016-10-21 NOTE — Patient Instructions (Addendum)
Stop taking Lisinopril 20 mg tablets for high blood pressure.  Start Losartan 100 mg tablets for high blood pressure.  Continue Amlodipine 10 mg and Chlorthalidone 25 mg.  Complete lab work prior to leaving today. I will notify you of your results once received.   Schedule a follow up visit in 2 weeks.   It was a pleasure to see you today!

## 2016-10-21 NOTE — Progress Notes (Signed)
Subjective:    Patient ID: Danielle Zamora, female    DOB: [birth year], 71 y.o.   MRN: [id number]  HPI  Ms. Danielle Zamora is a 71 year old female who presents today for follow up of hypertension. She is currently managed on Amlodipine 10 mg and Lisinopril 20 mg. During her last visit her blood pressure had become worse since the addition of Lisinopril to the Amlodipine so we added in chlorthalidone. She had not been checking her blood pressure at home.  Since her last visit her blood pressure is 162/102. She endorses compliance to all three medications. She has develop a dry cough since her last visit, this has become aggravating. She denies chest pain, dizziness, shortness of breath.   BP Readings from Last 3 Encounters:  10/21/16 (!) 162/102  09/28/16 (!) 180/94  09/14/16 (!) 156/100     Review of Systems  Constitutional: Negative for fatigue.  Eyes: Negative for visual disturbance.  Respiratory: Positive for cough. Negative for shortness of breath.   Cardiovascular: Negative for chest pain.  Neurological: Negative for dizziness and weakness.       Past Medical History:  Diagnosis Date  . Essential hypertension   . Pernicious anemia      Social History   Social History  . Marital status: Widowed    Spouse name: Danielle Zamora  . Number of children: Danielle Zamora  . Years of education: Danielle Zamora   Occupational History  . Not on file.   Social History Main Topics  . Smoking status: Current Every Day Smoker    Packs/day: 0.50    Years: 40.00    Types: Cigarettes  . Smokeless tobacco: Never Used  . Alcohol use 1.2 oz/week    2 Shots of liquor per week     Comment: daily  . Drug use: Yes    Types: Marijuana     Comment: use it rarely  . Sexual activity: Not on file   Other Topics Concern  . Not on file   Social History Narrative   Widow.   3 children, 9 grandchildren   Retired.    Moved from [place].   Enjoys spending time with family.    No past surgical history on file.  Family  History  Problem Relation Age of Onset  . Kidney disease Mother   . Breast cancer Sister     Allergies  Allergen Reactions  . Penicillins Swelling    Has patient had a PCN reaction causing immediate rash, facial/tongue/throat swelling, SOB or lightheadedness with hypotension: Yes Has patient had a PCN reaction causing severe rash involving mucus membranes or skin necrosis: Yes Has patient had a PCN reaction that required hospitalization: No Has patient had a PCN reaction occurring within the last 10 years: No If all of the above answers are "NO", then may proceed with Cephalosporin use.     Current Outpatient Prescriptions on File Prior to Visit  Medication Sig Dispense Refill  . chlorthalidone (HYGROTON) 25 MG tablet Take 1 tablet (25 mg total) by mouth daily. 30 tablet 0  . cyanocobalamin (,VITAMIN B-12,) 1000 MCG/ML injection Inject 1 mL (1,000 mcg total) into the muscle once a week. (Patient taking differently: Inject 1,000 mcg into the muscle every 30 (thirty) days. ) 4 mL 2  . SYRINGE-NEEDLE, DISP, 3 ML (B-D SYRINGE/NEEDLE 3CC/25GX5/8) 25G X 5/8" 3 ML MISC Use as instructed to inject Vitamin B12 once a week (Patient taking differently: Use as instructed to inject Vitamin B12 once a month) 50 each 2  .  amLODipine (NORVASC) 10 MG tablet Take 1 tablet by mouth daily for high blood pressure. (Patient not taking: Reported on 10/21/2016) 90 tablet 3   No current facility-administered medications on file prior to visit.     BP (!) 162/102   Pulse 67   Temp 97.9 F (36.6 C) (Oral)   Ht 5\' 3"  (1.6 m)   Wt 182 lb 1.9 oz (82.6 kg)   SpO2 97%   BMI 32.26 kg/m    Objective:   Physical Exam  Constitutional: She appears well-nourished.  Neck: Neck supple.  Cardiovascular: Normal rate and regular rhythm.   Pulmonary/Chest: Effort normal and breath sounds normal.  Skin: Skin is warm and dry.          Assessment & Plan:

## 2016-10-21 NOTE — Assessment & Plan Note (Signed)
Improved with addition of chlorthalidone, still above goal. Lisinopril likely the culprit for cough.  Continue chlorthalidone, amlodipine. Stop lisinopril. Add losartan 100 mg. Check BMP today. Follow up in 2 weeks for BP check.

## 2016-10-27 ENCOUNTER — Other Ambulatory Visit: Payer: Self-pay | Admitting: Primary Care

## 2016-10-27 DIAGNOSIS — I10 Essential (primary) hypertension: Secondary | ICD-10-CM

## 2016-11-04 ENCOUNTER — Ambulatory Visit (INDEPENDENT_AMBULATORY_CARE_PROVIDER_SITE_OTHER): Payer: Self-pay | Admitting: Primary Care

## 2016-11-04 ENCOUNTER — Encounter: Payer: Self-pay | Admitting: Primary Care

## 2016-11-04 VITALS — BP 144/94 | HR 100 | Temp 98.2°F | Ht 63.0 in | Wt 184.2 lb

## 2016-11-04 DIAGNOSIS — D510 Vitamin B12 deficiency anemia due to intrinsic factor deficiency: Secondary | ICD-10-CM

## 2016-11-04 DIAGNOSIS — E538 Deficiency of other specified B group vitamins: Secondary | ICD-10-CM

## 2016-11-04 DIAGNOSIS — I10 Essential (primary) hypertension: Secondary | ICD-10-CM

## 2016-11-04 LAB — BASIC METABOLIC PANEL
BUN: 20 mg/dL (ref 6–23)
CALCIUM: 11.3 mg/dL — AB (ref 8.4–10.5)
CHLORIDE: 105 meq/L (ref 96–112)
CO2: 28 mEq/L (ref 19–32)
CREATININE: 1.25 mg/dL — AB (ref 0.40–1.20)
GFR: 54.11 mL/min — AB (ref >60.00)
Glucose, Bld: 108 mg/dL — ABNORMAL HIGH (ref 70–99)
Potassium: 4.2 mEq/L (ref 3.5–5.1)
Sodium: 140 mEq/L (ref 135–145)

## 2016-11-04 LAB — VITAMIN B12: VITAMIN B 12: 583 pg/mL (ref 211–911)

## 2016-11-04 MED ORDER — LOSARTAN POTASSIUM 100 MG PO TABS: 100.0000 mg | ORAL_TABLET | Freq: Every day | ORAL | 3 refills | Status: DC

## 2016-11-04 NOTE — Assessment & Plan Note (Signed)
Decrease in B 12 from August 2018. Will recheck today and consider oral B 12 tablets. No evidence of pernicious anemia given negative intrinsic factor.

## 2016-11-04 NOTE — Progress Notes (Signed)
Subjective:    Patient ID: Danielle Zamora, female    DOB: 14-Jul-1945, 71 y.o.   MRN: [id number]  HPI  Ms. Faircloth is a 71 year old female who presents today for follow up of hypertension. Currently managed on chlorthalidone 25 mg, amlodipine 10 mg, and losartan 100 mg.   She endorsed a cough during her last visit so her lisinopril was removed and replaced with losartan 100 mg.   She is not checking her BP at home. She denies chest pain, dizziness, shortness of breath. She is urinating more frequently than before, this is overall not cumbersome.  BP Readings from Last 3 Encounters:  11/04/16 (!) 144/94  10/21/16 (!) 162/102  09/28/16 (!) 180/94     Review of Systems  Respiratory: Negative for shortness of breath.   Cardiovascular: Negative for chest pain.  Neurological: Negative for dizziness and headaches.       Past Medical History:  Diagnosis Date  . Essential hypertension   . Pernicious anemia      Social History   Social History  . Marital status: Widowed    Spouse name: N/A  . Number of children: N/A  . Years of education: N/A   Occupational History  . Not on file.   Social History Main Topics  . Smoking status: Current Every Day Smoker    Packs/day: 0.50    Years: 40.00    Types: Cigarettes  . Smokeless tobacco: Never Used  . Alcohol use 1.2 oz/week    2 Shots of liquor per week     Comment: daily  . Drug use: Yes    Types: Marijuana     Comment: use it rarely  . Sexual activity: Not on file   Other Topics Concern  . Not on file   Social History Narrative   Widow.   3 children, 9 grandchildren   Retired.    Moved from [place].   Enjoys spending time with family.    No past surgical history on file.  Family History  Problem Relation Age of Onset  . Kidney disease Mother   . Breast cancer Sister     Allergies  Allergen Reactions  . Penicillins Swelling    Has patient had a PCN reaction causing immediate rash, facial/tongue/throat  swelling, SOB or lightheadedness with hypotension: Yes Has patient had a PCN reaction causing severe rash involving mucus membranes or skin necrosis: Yes Has patient had a PCN reaction that required hospitalization: No Has patient had a PCN reaction occurring within the last 10 years: No If all of the above answers are "NO", then may proceed with Cephalosporin use.     Current Outpatient Prescriptions on File Prior to Visit  Medication Sig Dispense Refill  . amLODipine (NORVASC) 10 MG tablet Take 1 tablet by mouth daily for high blood pressure. 90 tablet 3  . chlorthalidone (HYGROTON) 25 MG tablet TAKE 1 TABLET BY MOUTH EVERY DAY 30 tablet 5  . SYRINGE-NEEDLE, DISP, 3 ML (B-D SYRINGE/NEEDLE 3CC/25GX5/8) 25G X 5/8" 3 ML MISC Use as instructed to inject Vitamin B12 once a week (Patient taking differently: Use as instructed to inject Vitamin B12 once a month) 50 each 2   No current facility-administered medications on file prior to visit.     BP (!) 144/94   Pulse 100   Temp 98.2 F (36.8 C) (Oral)   Ht 5\' 3"  (1.6 m)   Wt 184 lb 3.2 oz (83.6 kg)   SpO2 95%   BMI 32.63  kg/m    Objective:   Physical Exam  Constitutional: She appears well-nourished.  Neck: Neck supple.  Cardiovascular: Normal rate and regular rhythm.   Pulmonary/Chest: Effort normal and breath sounds normal.  Skin: Skin is warm and dry.          Assessment & Plan:

## 2016-11-04 NOTE — Assessment & Plan Note (Signed)
Improved. Will continue current regimen for now. strongly recommended she purchase a home BP monitor and start checking BP at home, she verbalized understanding. BMP pending today. Follow up in 2 months for BP check.

## 2016-11-04 NOTE — Patient Instructions (Signed)
I sent a refill of Losartan 100 mg tablets to [facility].  [facility] that you'd like your prescriptions for Amlodipine 10 mg and chlorthalidone 25 mg transferred over to their pharmacy.  Complete lab work prior to leaving today. I will notify you of your results once received.   Schedule a follow up visit in 2 months.  It was a pleasure to see you today!

## 2017-04-04 ENCOUNTER — Encounter: Payer: Self-pay | Admitting: *Deleted

## 2017-04-04 ENCOUNTER — Encounter: Payer: Self-pay | Admitting: Primary Care

## 2017-04-04 ENCOUNTER — Ambulatory Visit: Payer: Self-pay | Admitting: Primary Care

## 2017-04-04 VITALS — BP 148/96 | HR 85 | Temp 98.0°F | Ht 63.0 in | Wt 180.8 lb

## 2017-04-04 DIAGNOSIS — D510 Vitamin B12 deficiency anemia due to intrinsic factor deficiency: Secondary | ICD-10-CM

## 2017-04-04 DIAGNOSIS — I10 Essential (primary) hypertension: Secondary | ICD-10-CM

## 2017-04-04 LAB — CBC
HEMATOCRIT: 39.9 % (ref 36.0–46.0)
HEMOGLOBIN: 13.4 g/dL (ref 12.0–15.0)
MCHC: 33.6 g/dL (ref 30.0–36.0)
MCV: 95 fl (ref 78.0–100.0)
PLATELETS: 258 10*3/uL (ref 150.0–400.0)
RBC: 4.2 Mil/uL (ref 3.87–5.11)
RDW: 15.1 % (ref 11.5–15.5)
WBC: 7.4 10*3/uL (ref 4.0–10.5)

## 2017-04-04 LAB — BASIC METABOLIC PANEL
BUN: 16 mg/dL (ref 6–23)
CO2: 28 meq/L (ref 19–32)
Calcium: 11.2 mg/dL — ABNORMAL HIGH (ref 8.4–10.5)
Chloride: 104 mEq/L (ref 96–112)
Creatinine, Ser: 1.15 mg/dL (ref 0.40–1.20)
GFR: 59.51 mL/min — ABNORMAL LOW (ref >60.00)
GLUCOSE: 123 mg/dL — AB (ref 70–99)
POTASSIUM: 3.8 meq/L (ref 3.5–5.1)
SODIUM: 139 meq/L (ref 135–145)

## 2017-04-04 LAB — VITAMIN B12: Vitamin B-12: 900 pg/mL (ref 211–911)

## 2017-04-04 MED ORDER — IRBESARTAN 150 MG PO TABS: 150.0000 mg | ORAL_TABLET | Freq: Every day | ORAL | 0 refills | Status: DC

## 2017-04-04 NOTE — Progress Notes (Signed)
Subjective:    Patient ID: Danielle Zamora, female    DOB: [birth year], 72 y.o.   MRN: [id number]  HPI  Danielle Zamora is a 72 year old female who presents today for follow up.  1) Essential Hypertension: Currently managed on Amlodipine 10 mg, chlorthalidone 25 mg, losartan 100 mg. She's not taking her losartan as she saw a commercial regarding the recent losartan recall. She did not contact her pharmacy regarding any recalled medication. She is compliant to her other medications.   She's not checking her BP at home. She denies dizziness, headaches, visual changes.   BP Readings from Last 3 Encounters:  04/04/17 (!) 148/96  11/04/16 (!) 144/94  10/21/16 (!) 162/102   2) Pernicious Anemia: She is taking 1000 mcg of B12 once daily. She denies weakness, fatigue, shortness of breath.    Review of Systems  Constitutional: Negative for fatigue.  Eyes: Negative for visual disturbance.  Respiratory: Negative for shortness of breath.   Cardiovascular: Negative for chest pain.  Skin: Negative for color change.  Neurological: Negative for weakness and headaches.       Past Medical History:  Diagnosis Date  . Essential hypertension   . Pernicious anemia      Social History   Socioeconomic History  . Marital status: Widowed    Spouse name: Not on file  . Number of children: Not on file  . Years of education: Not on file  . Highest education level: Not on file  Occupational History  . Not on file  Social Needs  . Financial resource strain: Not on file  . Food insecurity:    Worry: Not on file    Inability: Not on file  . Transportation needs:    Medical: Not on file    Non-medical: Not on file  Tobacco Use  . Smoking status: Current Every Day Smoker    Packs/day: 0.50    Years: 40.00    Pack years: 20.00    Types: Cigarettes  . Smokeless tobacco: Never Used  Substance and Sexual Activity  . Alcohol use: Yes    Alcohol/week: 1.2 oz    Types: 2 Shots of liquor per week   Comment: daily  . Drug use: Yes    Types: Marijuana    Comment: use it rarely  . Sexual activity: Not on file  Lifestyle  . Physical activity:    Days per week: Not on file    Minutes per session: Not on file  . Stress: Not on file  Relationships  . Social connections:    Talks on phone: Not on file    Gets together: Not on file    Attends religious service: Not on file    Active member of club or organization: Not on file    Attends meetings of clubs or organizations: Not on file    Relationship status: Not on file  . Intimate partner violence:    Fear of current or ex partner: Not on file    Emotionally abused: Not on file    Physically abused: Not on file    Forced sexual activity: Not on file  Other Topics Concern  . Not on file  Social History Narrative   Widow.   3 children, 9 grandchildren   Retired.    Moved from [place].   Enjoys spending time with family.    Family History  Problem Relation Age of Onset  . Kidney disease Mother   . Breast cancer Sister  Allergies  Allergen Reactions  . Penicillins Swelling    Has patient had a PCN reaction causing immediate rash, facial/tongue/throat swelling, SOB or lightheadedness with hypotension: Yes Has patient had a PCN reaction causing severe rash involving mucus membranes or skin necrosis: Yes Has patient had a PCN reaction that required hospitalization: No Has patient had a PCN reaction occurring within the last 10 years: No If all of the above answers are "NO", then may proceed with Cephalosporin use.     Current Outpatient Medications on File Prior to Visit  Medication Sig Dispense Refill  . amLODipine (NORVASC) 10 MG tablet Take 1 tablet by mouth daily for high blood pressure. 90 tablet 3  . chlorthalidone (HYGROTON) 25 MG tablet TAKE 1 TABLET BY MOUTH EVERY DAY 30 tablet 5  . losartan (COZAAR) 100 MG tablet Take 1 tablet (100 mg total) by mouth daily. (Patient not taking: Reported on 04/04/2017) 90  tablet 3   No current facility-administered medications on file prior to visit.     BP (!) 148/96   Pulse 85   Temp 98 F (36.7 C) (Oral)   Ht 5\' 3"  (1.6 m)   Wt 180 lb 12 oz (82 kg)   SpO2 96%   BMI 32.02 kg/m    Objective:   Physical Exam  Constitutional: She appears well-nourished.  Neck: Neck supple.  Cardiovascular: Normal rate and regular rhythm.  Pulmonary/Chest: Effort normal and breath sounds normal.  Skin: Skin is warm and dry.          Assessment & Plan:

## 2017-04-04 NOTE — Patient Instructions (Signed)
Start Irbesartan 150 mg once daily for high blood pressure.  Continue Amlodipine 10 mg for high blood pressure. Continue chlorthalidone 25 mg for high blood pressure.  Start monitoring your blood pressure daily, around the same time of day, for the next several weeks.  Ensure that you have rested for 30 minutes prior to checking your blood pressure. Record your readings and bring them to your next visit.  Continue taking vitamin B 12 1000 mcg tablets once daily.  Stop by the lab prior to leaving today. I will notify you of your results once received.   Schedule a follow up visit for blood pressure check when you return from visiting your family.  It was a pleasure to see you today!

## 2017-04-04 NOTE — Assessment & Plan Note (Signed)
Complaint to B 12 1000 mcg tablets once daily.  Check CBC and B12 labs today. Appears well.

## 2017-04-04 NOTE — Assessment & Plan Note (Signed)
Above goal in the office today, stopped taking losartan several months ago.  Rx for irbesartan 150 mg tablets sent to pharmacy. Continue Amlodipine and chlorthalidone as prescribed.  Discussed to start monitoring BP at home and follow up in 2-3 weeks. BMP pending today.

## 2017-04-05 ENCOUNTER — Telehealth: Payer: Self-pay | Admitting: Primary Care

## 2017-04-05 NOTE — Telephone Encounter (Signed)
Copied from [place]. Topic: Quick Communication - Rx Refill/Question >> Apr 05, 2017 10:25 AM Carolyn Stare wrote: Pt said [facility] called and told her the below med would be cheaper at [facility] so she is requesting that the Med be sent there    Medication   irbesartan (AVAPRO) 150 MG tablet  Has the patient contacted their pharmacy yes    Preferred [facility]      Agent: Please be advised that RX refills may take up to 3 business days. We ask that you follow-up with your pharmacy.

## 2017-04-05 NOTE — Telephone Encounter (Signed)
Contacted pt regarding transfer of prescriptions to [facility] in [place], [place]; pt instructed to contact pharmacy; pt connected with pharmacy so that she can facilitate transfer of her medications.

## 2017-04-10 ENCOUNTER — Telehealth: Payer: Self-pay | Admitting: Primary Care

## 2017-04-10 NOTE — Telephone Encounter (Signed)
Please notify patient that I received her handicap placard request and I'm happy to support this request but need a reason for her requiring a placard. Has to rest after walking 200 feet, has pain with walking long distances (arthritis), needs to use cane/assistive device, etc. Let me know then I'll fill it out and place in Chan's inbox.

## 2017-04-10 NOTE — Telephone Encounter (Signed)
Pt dropped off form for parking placard. Placed in rx tower

## 2017-04-10 NOTE — Telephone Encounter (Signed)
Place form/paperwork in [facility] inbox for review and complete.

## 2017-04-11 NOTE — Telephone Encounter (Signed)
Spoken and notified patient of Danielle Zamora's comments. Patient stated that yes to all the below. Patient stated that she does not have a cane but would lean on the wall or object to help support her walking at times.

## 2017-04-11 NOTE — Telephone Encounter (Signed)
Patient notified. Left in the front office for patient to pick up.

## 2017-04-11 NOTE — Telephone Encounter (Signed)
Noted, placard application placed in Chan's inbox.

## 2017-04-17 ENCOUNTER — Telehealth: Payer: Self-pay | Admitting: Primary Care

## 2017-04-17 DIAGNOSIS — I10 Essential (primary) hypertension: Secondary | ICD-10-CM

## 2017-04-17 MED ORDER — CHLORTHALIDONE 25 MG PO TABS: 25.0000 mg | ORAL_TABLET | Freq: Every day | ORAL | 1 refills | Status: DC

## 2017-04-17 NOTE — Telephone Encounter (Signed)
Copied from [place] [id number]. Topic: Quick Communication - See Telephone Encounter >> Apr 17, 2017 11:42 AM Conception Chancy, NT wrote: CRM for notification. See Telephone encounter for: 04/17/17.  Patient is needing a refill on chlorthalidone (HYGROTON) 25 MG tablet please advise.  [facility] [address] [place] [address] Phone: [phone] Fax: [phone]

## 2017-05-16 ENCOUNTER — Telehealth: Payer: Self-pay | Admitting: Primary Care

## 2017-05-16 DIAGNOSIS — I10 Essential (primary) hypertension: Secondary | ICD-10-CM

## 2017-05-16 NOTE — Telephone Encounter (Signed)
Copied from [place] [id number]. Topic: Quick Communication - See Telephone Encounter >> May 16, 2017 12:37 PM Vernona Rieger wrote: CRM for notification. See Telephone encounter for: 05/16/17.  Patient is out of town and needs refills on her amLODipine (NORVASC) 10 MG tablet & irbesartan (AVAPRO) 150 MG tablet. She only wants 30 days worth and wants it sent to a pharmacy where she is.   [facility] [place], [place] Phone number [phone]

## 2017-05-17 MED ORDER — AMLODIPINE BESYLATE 10 MG PO TABS: ORAL_TABLET | ORAL | 0 refills | Status: DC

## 2017-05-17 MED ORDER — IRBESARTAN 150 MG PO TABS: 150.0000 mg | ORAL_TABLET | Freq: Every day | ORAL | 0 refills | Status: DC

## 2017-07-02 ENCOUNTER — Other Ambulatory Visit: Payer: Self-pay | Admitting: Primary Care

## 2017-07-02 DIAGNOSIS — I10 Essential (primary) hypertension: Secondary | ICD-10-CM

## 2017-08-24 ENCOUNTER — Telehealth: Payer: Self-pay | Admitting: Primary Care

## 2017-08-24 NOTE — Telephone Encounter (Signed)
Pt dropped off form to be excused from jury duty. Placed in rx tower.

## 2017-08-25 NOTE — Telephone Encounter (Signed)
Pt reports she can not do a lot of walking and getting up and down. Additionally she has to urinate a lot.

## 2017-08-25 NOTE — Telephone Encounter (Signed)
Let's get her in for an office visit to discuss. Please schedule at her convenience.

## 2017-08-25 NOTE — Telephone Encounter (Signed)
Please inquire and ask for reasoning behind jury duty excuse.  Right now I don't see a good reason for excuse.

## 2017-08-29 NOTE — Telephone Encounter (Signed)
Pt already scheduled 8/27. Nothing available sooner.

## 2017-09-05 ENCOUNTER — Ambulatory Visit: Payer: Self-pay | Admitting: Primary Care

## 2017-09-18 ENCOUNTER — Ambulatory Visit (INDEPENDENT_AMBULATORY_CARE_PROVIDER_SITE_OTHER): Payer: Medicare Other | Admitting: Primary Care

## 2017-09-18 ENCOUNTER — Encounter: Payer: Self-pay | Admitting: Primary Care

## 2017-09-18 VITALS — BP 140/86 | HR 100 | Temp 98.0°F | Ht 63.0 in | Wt 183.2 lb

## 2017-09-18 DIAGNOSIS — I10 Essential (primary) hypertension: Secondary | ICD-10-CM | POA: Diagnosis not present

## 2017-09-18 DIAGNOSIS — D510 Vitamin B12 deficiency anemia due to intrinsic factor deficiency: Secondary | ICD-10-CM

## 2017-09-18 LAB — COMPREHENSIVE METABOLIC PANEL
ALBUMIN: 4.2 g/dL (ref 3.5–5.2)
ALK PHOS: 70 U/L (ref 39–117)
ALT: 11 U/L (ref 0–35)
AST: 12 U/L (ref 0–37)
BILIRUBIN TOTAL: 0.9 mg/dL (ref 0.2–1.2)
BUN: 22 mg/dL (ref 6–23)
CO2: 26 mEq/L (ref 19–32)
Calcium: 11.4 mg/dL — ABNORMAL HIGH (ref 8.4–10.5)
Chloride: 106 mEq/L (ref 96–112)
Creatinine, Ser: 1.27 mg/dL — ABNORMAL HIGH (ref 0.40–1.20)
GFR: 53 mL/min — ABNORMAL LOW (ref >60.00)
Glucose, Bld: 100 mg/dL — ABNORMAL HIGH (ref 70–99)
Potassium: 4.5 mEq/L (ref 3.5–5.1)
SODIUM: 139 meq/L (ref 135–145)
Total Protein: 7.6 g/dL (ref 6.0–8.3)

## 2017-09-18 LAB — CBC
HCT: 39.1 % (ref 36.0–46.0)
HEMOGLOBIN: 13.4 g/dL (ref 12.0–15.0)
MCHC: 34.3 g/dL (ref 30.0–36.0)
MCV: 95.4 fl (ref 78.0–100.0)
PLATELETS: 258 10*3/uL (ref 150.0–400.0)
RBC: 4.1 Mil/uL (ref 3.87–5.11)
RDW: 14.1 % (ref 11.5–15.5)
WBC: 6.4 10*3/uL (ref 4.0–10.5)

## 2017-09-18 LAB — VITAMIN B12: Vitamin B-12: 806 pg/mL (ref 211–911)

## 2017-09-18 MED ORDER — CHLORTHALIDONE 25 MG PO TABS: 25.0000 mg | ORAL_TABLET | Freq: Every day | ORAL | 1 refills | Status: DC

## 2017-09-18 MED ORDER — IRBESARTAN 150 MG PO TABS: 150.0000 mg | ORAL_TABLET | Freq: Every day | ORAL | 1 refills | Status: DC

## 2017-09-18 MED ORDER — AMLODIPINE BESYLATE 10 MG PO TABS
ORAL_TABLET | ORAL | 1 refills | Status: DC
Start: 2017-09-18 — End: 2018-03-27

## 2017-09-18 NOTE — Patient Instructions (Signed)
Stop by the lab prior to leaving today. I will notify you of your results once received.   I sent your medications to the [facility] and [address].  You are welcome to see Dr. Lorelei Pont for your knee when you're ready.  It was a pleasure to see you today!

## 2017-09-18 NOTE — Progress Notes (Signed)
Subjective:    Patient ID: Danielle Zamora, female    DOB: [birth year], 72 y.o.   MRN: [id number]  HPI  Ms. Danielle Zamora is a 72 year old female who presents today for follow up of hypertension.  She is currently managed on Amlodipine 10 mg, chlorthalidone 25 mg, irbesartan 150 mg. She denies chest pain, shortness of breath, lower extremity edema. She has noticed a swelling behind her right knee for the last several months.   She is not checking her blood pressure at home.   BP Readings from Last 3 Encounters:  09/18/17 140/86  04/04/17 (!) 148/96  11/04/16 (!) 144/94    She is taking oral vitamin B12 1000 mcg once daily. Also taking a multi-vitamin.   Review of Systems  Constitutional: Negative for fatigue.  Eyes: Negative for visual disturbance.  Respiratory: Negative for shortness of breath.   Cardiovascular: Negative for chest pain.  Neurological: Negative for dizziness and headaches.       Past Medical History:  Diagnosis Date  . Essential hypertension   . Pernicious anemia      Social History   Socioeconomic History  . Marital status: Widowed    Spouse name: Not on file  . Number of children: Not on file  . Years of education: Not on file  . Highest education level: Not on file  Occupational History  . Not on file  Social Needs  . Financial resource strain: Not on file  . Food insecurity:    Worry: Not on file    Inability: Not on file  . Transportation needs:    Medical: Not on file    Non-medical: Not on file  Tobacco Use  . Smoking status: Current Every Day Smoker    Packs/day: 0.50    Years: 40.00    Pack years: 20.00    Types: Cigarettes  . Smokeless tobacco: Never Used  Substance and Sexual Activity  . Alcohol use: Yes    Alcohol/week: 2.0 standard drinks    Types: 2 Shots of liquor per week    Comment: daily  . Drug use: Yes    Types: Marijuana    Comment: use it rarely  . Sexual activity: Not on file  Lifestyle  . Physical activity:    Days  per week: Not on file    Minutes per session: Not on file  . Stress: Not on file  Relationships  . Social connections:    Talks on phone: Not on file    Gets together: Not on file    Attends religious service: Not on file    Active member of club or organization: Not on file    Attends meetings of clubs or organizations: Not on file    Relationship status: Not on file  . Intimate partner violence:    Fear of current or ex partner: Not on file    Emotionally abused: Not on file    Physically abused: Not on file    Forced sexual activity: Not on file  Other Topics Concern  . Not on file  Social History Narrative   Widow.   3 children, 9 grandchildren   Retired.    Moved from [place].   Enjoys spending time with family.      Family History  Problem Relation Age of Onset  . Kidney disease Mother   . Breast cancer Sister     Allergies  Allergen Reactions  . Penicillins Swelling    Has patient had a PCN  reaction causing immediate rash, facial/tongue/throat swelling, SOB or lightheadedness with hypotension: Yes Has patient had a PCN reaction causing severe rash involving mucus membranes or skin necrosis: Yes Has patient had a PCN reaction that required hospitalization: No Has patient had a PCN reaction occurring within the last 10 years: No If all of the above answers are "NO", then may proceed with Cephalosporin use.     No current outpatient medications on file prior to visit.   No current facility-administered medications on file prior to visit.     BP 140/86   Pulse 100   Temp 98 F (36.7 C) (Oral)   Ht 5\' 3"  (1.6 m)   Wt 183 lb 4 oz (83.1 kg)   BMI 32.46 kg/m    Objective:   Physical Exam  Constitutional: She appears well-nourished.  Neck: Neck supple.  Cardiovascular: Normal rate and regular rhythm.  Respiratory: Effort normal and breath sounds normal.  Skin: Skin is warm and dry.           Assessment & Plan:

## 2017-09-18 NOTE — Assessment & Plan Note (Signed)
Appears well. Compliant to oral B12 daily.  B12 and CBC labs pending.

## 2017-09-18 NOTE — Assessment & Plan Note (Signed)
Stable in the office today, continue current regimen. BMP pending. Refills sent to new pharmacy per patient request.

## 2017-09-19 ENCOUNTER — Other Ambulatory Visit: Payer: Self-pay | Admitting: Primary Care

## 2017-09-21 ENCOUNTER — Other Ambulatory Visit (INDEPENDENT_AMBULATORY_CARE_PROVIDER_SITE_OTHER): Payer: Medicare Other

## 2017-09-21 ENCOUNTER — Other Ambulatory Visit: Payer: Medicare Other

## 2017-09-22 LAB — PTH, INTACT AND CALCIUM
CALCIUM: 11.5 mg/dL — AB (ref 8.6–10.4)
PTH: 161 pg/mL — AB (ref 14–64)

## 2017-09-29 ENCOUNTER — Other Ambulatory Visit: Payer: Self-pay | Admitting: Primary Care

## 2017-09-29 DIAGNOSIS — R7989 Other specified abnormal findings of blood chemistry: Secondary | ICD-10-CM

## 2017-10-09 ENCOUNTER — Other Ambulatory Visit: Payer: Self-pay | Admitting: Primary Care

## 2017-10-09 ENCOUNTER — Ambulatory Visit (INDEPENDENT_AMBULATORY_CARE_PROVIDER_SITE_OTHER): Payer: Medicare Other | Admitting: Primary Care

## 2017-10-09 ENCOUNTER — Encounter: Payer: Self-pay | Admitting: Primary Care

## 2017-10-09 VITALS — BP 180/100 | HR 108 | Temp 98.0°F | Ht 63.0 in | Wt 180.2 lb

## 2017-10-09 DIAGNOSIS — I10 Essential (primary) hypertension: Secondary | ICD-10-CM

## 2017-10-09 DIAGNOSIS — N289 Disorder of kidney and ureter, unspecified: Secondary | ICD-10-CM | POA: Diagnosis not present

## 2017-10-09 DIAGNOSIS — E213 Hyperparathyroidism, unspecified: Secondary | ICD-10-CM | POA: Insufficient documentation

## 2017-10-09 DIAGNOSIS — R7989 Other specified abnormal findings of blood chemistry: Secondary | ICD-10-CM

## 2017-10-09 DIAGNOSIS — N184 Chronic kidney disease, stage 4 (severe): Secondary | ICD-10-CM | POA: Insufficient documentation

## 2017-10-09 LAB — BASIC METABOLIC PANEL
BUN: 16 mg/dL (ref 6–23)
CALCIUM: 11.4 mg/dL — AB (ref 8.4–10.5)
CO2: 27 mEq/L (ref 19–32)
Chloride: 106 mEq/L (ref 96–112)
Creatinine, Ser: 1.17 mg/dL (ref 0.40–1.20)
GFR: 58.25 mL/min — AB (ref >60.00)
GLUCOSE: 104 mg/dL — AB (ref 70–99)
Potassium: 4.4 mEq/L (ref 3.5–5.1)
SODIUM: 140 meq/L (ref 135–145)

## 2017-10-09 MED ORDER — IRBESARTAN 300 MG PO TABS: ORAL_TABLET | ORAL | 0 refills | Status: DC

## 2017-10-09 NOTE — Assessment & Plan Note (Signed)
Uncontrolled off of chlorthalidone. Continue off until BMP received. Will likely increase irbesartan to 300 mg, continue Amlodipine.  Also suspect potential hyperparathyroidism to be causing difficult to control hypertension. She will be seeing endocrinology soon.

## 2017-10-09 NOTE — Progress Notes (Signed)
Subjective:    Patient ID: Danielle Zamora, female    DOB: [birth year], 72 y.o.   MRN: [id number]  HPI  Danielle Zamora is a 72 year old female who presents today for follow up of hypertension. She was also suspected to have hyperparathyroidism given elevated PTH with hypercalcemia.   She underwent BMP in early September with significant decrease in renal function when compared to March 2019 so her chlorthalidone was discontinued.    Since her chlorthalidone was discontinued her blood pressure has increased. She is not checking her blood pressure at home. She denies chest pain, dizziness, headaches.   BP Readings from Last 3 Encounters:  10/09/17 (!) 180/100  09/18/17 140/86  04/04/17 (!) 148/96     Review of Systems  Eyes: Negative for visual disturbance.  Respiratory: Negative for shortness of breath.   Cardiovascular: Negative for chest pain.  Neurological: Negative for dizziness and headaches.       Past Medical History:  Diagnosis Date  . Essential hypertension   . Pernicious anemia      Social History   Socioeconomic History  . Marital status: Widowed    Spouse name: Not on file  . Number of children: Not on file  . Years of education: Not on file  . Highest education level: Not on file  Occupational History  . Not on file  Social Needs  . Financial resource strain: Not on file  . Food insecurity:    Worry: Not on file    Inability: Not on file  . Transportation needs:    Medical: Not on file    Non-medical: Not on file  Tobacco Use  . Smoking status: Current Every Day Smoker    Packs/day: 0.50    Years: 40.00    Pack years: 20.00    Types: Cigarettes  . Smokeless tobacco: Never Used  Substance and Sexual Activity  . Alcohol use: Yes    Alcohol/week: 2.0 standard drinks    Types: 2 Shots of liquor per week    Comment: daily  . Drug use: Yes    Types: Marijuana    Comment: use it rarely  . Sexual activity: Not on file  Lifestyle  . Physical activity:      Days per week: Not on file    Minutes per session: Not on file  . Stress: Not on file  Relationships  . Social connections:    Talks on phone: Not on file    Gets together: Not on file    Attends religious service: Not on file    Active member of club or organization: Not on file    Attends meetings of clubs or organizations: Not on file    Relationship status: Not on file  . Intimate partner violence:    Fear of current or ex partner: Not on file    Emotionally abused: Not on file    Physically abused: Not on file    Forced sexual activity: Not on file  Other Topics Concern  . Not on file  Social History Narrative   Widow.   3 children, 9 grandchildren   Retired.    Moved from [place].   Enjoys spending time with family.    No past surgical history on file.  Family History  Problem Relation Age of Onset  . Kidney disease Mother   . Breast cancer Sister     Allergies  Allergen Reactions  . Penicillins Swelling    Has patient had a PCN  reaction causing immediate rash, facial/tongue/throat swelling, SOB or lightheadedness with hypotension: Yes Has patient had a PCN reaction causing severe rash involving mucus membranes or skin necrosis: Yes Has patient had a PCN reaction that required hospitalization: No Has patient had a PCN reaction occurring within the last 10 years: No If all of the above answers are "NO", then may proceed with Cephalosporin use.     Current Outpatient Medications on File Prior to Visit  Medication Sig Dispense Refill  . amLODipine (NORVASC) 10 MG tablet Take 1 tablet by mouth daily for high blood pressure. 90 tablet 1  . irbesartan (AVAPRO) 150 MG tablet Take 1 tablet (150 mg total) by mouth daily. 90 tablet 1  . chlorthalidone (HYGROTON) 25 MG tablet Take 1 tablet (25 mg total) by mouth daily. (Patient not taking: Reported on 10/09/2017) 90 tablet 1   No current facility-administered medications on file prior to visit.     BP (!) 180/100    Pulse (!) 108   Temp 98 F (36.7 C) (Oral)   Ht 5\' 3"  (1.6 m)   Wt 180 lb 4 oz (81.8 kg)   SpO2 98%   BMI 31.93 kg/m    Objective:   Physical Exam  Constitutional: She is oriented to person, place, and time. She appears well-nourished.  Neck: Neck supple.  Cardiovascular: Normal rate and regular rhythm.  Respiratory: Effort normal and breath sounds normal.  Neurological: She is alert and oriented to person, place, and time.  Skin: Skin is warm and dry.           Assessment & Plan:

## 2017-10-09 NOTE — Assessment & Plan Note (Signed)
Continue off of chlorthalidone for now, repeat BMP pending. Managed on ARB.

## 2017-10-09 NOTE — Patient Instructions (Signed)
Stop by the lab prior to leaving today. I will notify you of your results once received.   Continue taking irbesartan 150 mg tablets for blood pressure. We may end up increasing the dose to 300 mg. We will call you to confirm.  Continue taking amlodipine 10 mg tablets for blood pressure.  Do not take chlorthalidone 25 mg for blood pressure for now.   I'm going to have our staff check on your appointment with endocrinology.   It was a pleasure to see you today!

## 2017-10-09 NOTE — Assessment & Plan Note (Signed)
Also with hypercalcemia.  She will be seeing endocrinology soon for further evaluation.

## 2017-10-12 MED ORDER — IRBESARTAN 300 MG PO TABS: ORAL_TABLET | ORAL | 0 refills | Status: DC

## 2017-11-21 ENCOUNTER — Telehealth: Payer: Self-pay

## 2017-11-21 NOTE — Telephone Encounter (Signed)
Pt called requesting refill on irbesartan; I explained that irbesartan 300 mg taking one tab po daily was sent in # 90 on 10/12/17. Pt said [facility] [address] [facility] not [facility] that refill. I spoke with April at [facility] [address] and they do have irbesartan 300 mg and will get ready for pick up in one hr. Pt voiced understanding and will pick up rx today and pt scheduled appt with Gentry Fitz NP on 11/22/17 for BP ck. Nothing further needed per pt.

## 2017-11-22 ENCOUNTER — Ambulatory Visit: Payer: Medicare Other | Admitting: Primary Care

## 2017-11-22 ENCOUNTER — Ambulatory Visit (INDEPENDENT_AMBULATORY_CARE_PROVIDER_SITE_OTHER): Payer: Medicare Other | Admitting: Primary Care

## 2017-11-22 DIAGNOSIS — R7989 Other specified abnormal findings of blood chemistry: Secondary | ICD-10-CM

## 2017-11-22 DIAGNOSIS — I10 Essential (primary) hypertension: Secondary | ICD-10-CM

## 2017-11-22 MED ORDER — METOPROLOL SUCCINATE ER 25 MG PO TB24: 25.0000 mg | ORAL_TABLET | Freq: Every day | ORAL | 1 refills | Status: DC

## 2017-11-22 NOTE — Assessment & Plan Note (Signed)
Above goal with irbesartan 300 mg and Amlodipine 10 mg. Add in metoprolol succinate 25 mg for tachycardia and better BP control.  Suspect hyperparathyroidism to be part of the cause for such high readings. She has an appointment with endocrinology in January 2020, will try to get her in sooner.

## 2017-11-22 NOTE — Assessment & Plan Note (Signed)
Has an appointment with endocrinology in January 2020, will try to get her seen sooner. Referral coordinator is working on this now.

## 2017-11-22 NOTE — Patient Instructions (Addendum)
Continue taking irbesartan 300 mg for blood pressure.   Continue taking amlodipine 10 mg for blood pressure.  Start metoprolol succinate 25 mg daily for blood pressure. Take 1 tablet by mouth once daily.   Start monitoring your blood pressure daily, around the same time of day, for the next 2-3 weeks.  Ensure that you have rested for 30 minutes prior to checking your blood pressure. Record your readings and bring them to your next visit.  Schedule a follow up visit in 3 weeks for blood pressure check. Please call me if you have any problems.   It was a pleasure to see you today!

## 2017-11-22 NOTE — Progress Notes (Signed)
Subjective:    Patient ID: Danielle Zamora, female    DOB: Sep 06, 1945, 72 y.o.   MRN: [id number]  HPI  Danielle Zamora is a 72 year old female who presents today for follow up of hypertension.  BP Readings from Last 3 Encounters:  11/22/17 (!) 156/82  10/09/17 (!) 180/100  09/18/17 140/86   She is currently managed on amlodipine 10 mg and irbesartan 300 mg. During her last several visits her calcium level was noted to be elevated. For this reason a PTH was drawn which showed elevation and was suspicious for hyperparathyroidism.   Since her last visit she's been taking irbesartan 150 mg and amlodipine 10. She has not yet picked up her irbesartan 300 mg tablets as she forgot. She did have some coffee today before coming to the clinic. She denies palpitations, chest pain. She does have exertional shortness of breath with prolonged walking.   She has an appointment with endocrinology in January 2020.   Review of Systems  Constitutional: Negative for fatigue.  Eyes: Negative for visual disturbance.  Respiratory: Negative for shortness of breath.   Cardiovascular: Negative for chest pain.  Neurological: Negative for dizziness.       Occasional left sided frontal headache       Past Medical History:  Diagnosis Date  . Essential hypertension   . Pernicious anemia      Social History   Socioeconomic History  . Marital status: Widowed    Spouse name: Not on file  . Number of children: Not on file  . Years of education: Not on file  . Highest education level: Not on file  Occupational History  . Not on file  Social Needs  . Financial resource strain: Not on file  . Food insecurity:    Worry: Not on file    Inability: Not on file  . Transportation needs:    Medical: Not on file    Non-medical: Not on file  Tobacco Use  . Smoking status: Current Every Day Smoker    Packs/day: 0.50    Years: 40.00    Pack years: 20.00    Types: Cigarettes  . Smokeless tobacco: Never Used    Substance and Sexual Activity  . Alcohol use: Yes    Alcohol/week: 2.0 standard drinks    Types: 2 Shots of liquor per week    Comment: daily  . Drug use: Yes    Types: Marijuana    Comment: use it rarely  . Sexual activity: Not on file  Lifestyle  . Physical activity:    Days per week: Not on file    Minutes per session: Not on file  . Stress: Not on file  Relationships  . Social connections:    Talks on phone: Not on file    Gets together: Not on file    Attends religious service: Not on file    Active member of club or organization: Not on file    Attends meetings of clubs or organizations: Not on file    Relationship status: Not on file  . Intimate partner violence:    Fear of current or ex partner: Not on file    Emotionally abused: Not on file    Physically abused: Not on file    Forced sexual activity: Not on file  Other Topics Concern  . Not on file  Social History Narrative   Widow.   3 children, 9 grandchildren   Retired.    Moved from [place].  Enjoys spending time with family.    No past surgical history on file.  Family History  Problem Relation Age of Onset  . Kidney disease Mother   . Breast cancer Sister     Allergies  Allergen Reactions  . Penicillins Swelling    Has patient had a PCN reaction causing immediate rash, facial/tongue/throat swelling, SOB or lightheadedness with hypotension: Yes Has patient had a PCN reaction causing severe rash involving mucus membranes or skin necrosis: Yes Has patient had a PCN reaction that required hospitalization: No Has patient had a PCN reaction occurring within the last 10 years: No If all of the above answers are "NO", then may proceed with Cephalosporin use.     Current Outpatient Medications on File Prior to Visit  Medication Sig Dispense Refill  . amLODipine (NORVASC) 10 MG tablet Take 1 tablet by mouth daily for high blood pressure. 90 tablet 1  . irbesartan (AVAPRO) 300 MG tablet Take 1 tablet  by mouth once daily for blood pressure. 90 tablet 0   No current facility-administered medications on file prior to visit.     BP (!) 156/82   Pulse (!) 108   Temp 98 F (36.7 C) (Oral)   Ht 5\' 3"  (1.6 m)   Wt 188 lb 8 oz (85.5 kg)   SpO2 98%   BMI 33.39 kg/m    Objective:   Physical Exam  Constitutional: She appears well-nourished.  Neck: Neck supple.  Cardiovascular: Normal rate and regular rhythm.  Respiratory: Effort normal and breath sounds normal.  Skin: Skin is warm and dry.  Psychiatric: She has a normal mood and affect.           Assessment & Plan:

## 2017-12-13 ENCOUNTER — Ambulatory Visit: Payer: Medicare Other | Admitting: Primary Care

## 2017-12-27 ENCOUNTER — Encounter: Payer: Self-pay | Admitting: Primary Care

## 2018-02-22 ENCOUNTER — Other Ambulatory Visit: Payer: Self-pay | Admitting: Primary Care

## 2018-02-22 DIAGNOSIS — I10 Essential (primary) hypertension: Secondary | ICD-10-CM

## 2018-03-26 ENCOUNTER — Other Ambulatory Visit: Payer: Self-pay | Admitting: Primary Care

## 2018-03-26 DIAGNOSIS — I10 Essential (primary) hypertension: Secondary | ICD-10-CM

## 2018-04-04 ENCOUNTER — Other Ambulatory Visit: Payer: Self-pay | Admitting: Primary Care

## 2018-04-04 DIAGNOSIS — I10 Essential (primary) hypertension: Secondary | ICD-10-CM

## 2018-05-27 ENCOUNTER — Other Ambulatory Visit: Payer: Self-pay | Admitting: Primary Care

## 2018-05-27 DIAGNOSIS — I10 Essential (primary) hypertension: Secondary | ICD-10-CM

## 2018-09-04 ENCOUNTER — Other Ambulatory Visit: Payer: Self-pay | Admitting: Primary Care

## 2018-09-04 DIAGNOSIS — I10 Essential (primary) hypertension: Secondary | ICD-10-CM

## 2018-10-04 ENCOUNTER — Other Ambulatory Visit: Payer: Self-pay | Admitting: Primary Care

## 2018-10-04 DIAGNOSIS — I10 Essential (primary) hypertension: Secondary | ICD-10-CM

## 2018-11-25 ENCOUNTER — Other Ambulatory Visit: Payer: Self-pay | Admitting: Primary Care

## 2018-11-25 DIAGNOSIS — I10 Essential (primary) hypertension: Secondary | ICD-10-CM

## 2018-11-26 NOTE — Telephone Encounter (Signed)
Please call patient and schedule annual physical. Send back to [place] after appointment is scheduled.

## 2018-11-27 NOTE — Telephone Encounter (Signed)
Pt scheduled for annual visit in January

## 2018-12-07 ENCOUNTER — Other Ambulatory Visit: Payer: Self-pay | Admitting: Primary Care

## 2018-12-07 DIAGNOSIS — I10 Essential (primary) hypertension: Secondary | ICD-10-CM

## 2019-01-03 ENCOUNTER — Other Ambulatory Visit: Payer: Self-pay | Admitting: Primary Care

## 2019-01-03 DIAGNOSIS — D510 Vitamin B12 deficiency anemia due to intrinsic factor deficiency: Secondary | ICD-10-CM

## 2019-01-03 DIAGNOSIS — I10 Essential (primary) hypertension: Secondary | ICD-10-CM

## 2019-01-14 ENCOUNTER — Other Ambulatory Visit: Payer: Self-pay

## 2019-01-14 ENCOUNTER — Ambulatory Visit (INDEPENDENT_AMBULATORY_CARE_PROVIDER_SITE_OTHER): Payer: Medicare Other

## 2019-01-14 DIAGNOSIS — Z0000 Encounter for general adult medical examination without abnormal findings: Secondary | ICD-10-CM | POA: Diagnosis not present

## 2019-01-14 NOTE — Progress Notes (Signed)
PCP notes:  Health Maintenance: Declined prevnar 13, flu vaccine, mammogram and colonoscopy   Abnormal Screenings: none   Patient concerns: none   Nurse concerns: none   Next PCP appt.: 01/21/2019 @ 11:40 am

## 2019-01-14 NOTE — Progress Notes (Signed)
Subjective:   Danielle Zamora is a 74 y.o. female who presents for Medicare Annual (Subsequent) preventive examination.  Review of Systems: N/A   This visit is being conducted through telemedicine via telephone at the nurse health advisor's home address due to the COVID-19 pandemic. This patient has given me verbal consent via doximity to conduct this visit, patient states they are participating from their home address. Patient and myself are on the telephone call. There is no referral for this visit. Some vital signs may be absent or patient reported.    Patient identification: identified by name, DOB, and current address   Cardiac Risk Factors include: advanced age (>86men, >5 women);hypertension     Objective:     Vitals: There were no vitals taken for this visit.  There is no height or weight on file to calculate BMI.  Advanced Directives 01/14/2019 08/11/2016 08/10/2016  Does Patient Have a Medical Advance Directive? No No No  Would patient like information on creating a medical advance directive? Yes (MAU/Ambulatory/Procedural Areas - Information given) No - Patient declined No - Patient declined    Tobacco Social History   Tobacco Use  Smoking Status Current Every Day Smoker  . Packs/day: 0.50  . Years: 40.00  . Pack years: 20.00  . Types: Cigarettes  Smokeless Tobacco Never Used     Ready to quit: Not Answered Counseling given: Not Answered   Clinical Intake:  Pre-visit preparation completed: Yes  Pain : No/denies pain     Nutritional Risks: None Diabetes: No  How often do you need to have someone help you when you read instructions, pamphlets, or other written materials from your doctor or pharmacy?: 1 - Never What is the last grade level you completed in school?: 12th, some college  Interpreter Needed?: No  Information entered by :: CJohnson, LPN  Past Medical History:  Diagnosis Date  . Essential hypertension   . Pernicious anemia    History  reviewed. No pertinent surgical history. Family History  Problem Relation Age of Onset  . Kidney disease Mother   . Breast cancer Sister    Social History   Socioeconomic History  . Marital status: Widowed    Spouse name: Not on file  . Number of children: Not on file  . Years of education: Not on file  . Highest education level: Not on file  Occupational History  . Not on file  Tobacco Use  . Smoking status: Current Every Day Smoker    Packs/day: 0.50    Years: 40.00    Pack years: 20.00    Types: Cigarettes  . Smokeless tobacco: Never Used  Substance and Sexual Activity  . Alcohol use: Yes    Alcohol/week: 2.0 standard drinks    Types: 2 Shots of liquor per week    Comment: daily  . Drug use: Yes    Types: Marijuana    Comment: use it rarely  . Sexual activity: Not on file  Other Topics Concern  . Not on file  Social History Narrative   Widow.   3 children, 9 grandchildren   Retired.    Moved from [place].   Enjoys spending time with family.   Social Determinants of Health   Financial Resource Strain: Low Risk   . Difficulty of Paying Living Expenses: Not hard at all  Food Insecurity: No Food Insecurity  . Worried About Charity fundraiser in the Last Year: Never true  . Ran Out of Food in the Last  Year: Never true  Transportation Needs: No Transportation Needs  . Lack of Transportation (Medical): No  . Lack of Transportation (Non-Medical): No  Physical Activity: Inactive  . Days of Exercise per Week: 0 days  . Minutes of Exercise per Session: 0 min  Stress: No Stress Concern Present  . Feeling of Stress : Only a little  Social Connections:   . Frequency of Communication with Friends and Family: Not on file  . Frequency of Social Gatherings with Friends and Family: Not on file  . Attends Religious Services: Not on file  . Active Member of Clubs or Organizations: Not on file  . Attends Archivist Meetings: Not on file  . Marital Status: Not on  file    Outpatient Encounter Medications as of 01/14/2019  Medication Sig  . amLODipine (NORVASC) 10 MG tablet TAKE 1 TABLET BY MOUTH EVERY DAY  . calcitRIOL (ROCALTROL) 0.5 MCG capsule TK ONE C PO QD  . irbesartan (AVAPRO) 300 MG tablet TAKE 1 TABLET BY MOUTH EVERY DAY FOR BLOOD PRESSURE  . metoprolol succinate (TOPROL-XL) 25 MG 24 hr tablet TAKE 1 TABLET(25 MG) BY MOUTH DAILY FOR BLOOD PRESSURE   No facility-administered encounter medications on file as of 01/14/2019.    Activities of Daily Living In your present state of health, do you have any difficulty performing the following activities: 01/14/2019  Hearing? N  Vision? N  Difficulty concentrating or making decisions? N  Walking or climbing stairs? N  Dressing or bathing? N  Doing errands, shopping? N  Preparing Food and eating ? N  Using the Toilet? N  In the past six months, have you accidently leaked urine? Y  Comment wears pad  Do you have problems with loss of bowel control? N  Managing your Medications? N  Managing your Finances? N  Housekeeping or managing your Housekeeping? N  Some recent data might be hidden    Patient Care Team: Pleas Koch, NP as PCP - General (Internal Medicine)    Assessment:   This is a routine wellness examination for Danielle Zamora.  Exercise Activities and Dietary recommendations Current Exercise Habits: The patient does not participate in regular exercise at present, Exercise limited by: None identified  Goals    . Patient Stated     01/14/2019, I will try to start exercising and walking more daily.       Fall Risk Fall Risk  01/14/2019 09/18/2017  Falls in the past year? 1 No  Comment tripped and fell -  Number falls in past yr: 0 -  Injury with Fall? 0 -  Risk for fall due to : Medication side effect;Impaired mobility -  Follow up Falls evaluation completed;Falls prevention discussed -   Is the patient's home free of loose throw rugs in walkways, pet beds, electrical cords, etc?    yes      Grab bars in the bathroom? yes      Handrails on the stairs?   yes      Adequate lighting?   yes  Timed Get Up and Go performed: N/A  Depression Screen PHQ 2/9 Scores 01/14/2019  PHQ - 2 Score 0  PHQ- 9 Score 0     Cognitive Function MMSE - Mini Mental State Exam 01/14/2019  Orientation to time 5  Orientation to Place 5  Registration 3  Attention/ Calculation 5  Recall 3  Language- repeat 1       Mini Cog  Mini-Cog screen was completed. Maximum score is  22. A value of 0 denotes this part of the MMSE was not completed or the patient failed this part of the Mini-Cog screening.   There is no immunization history on file for this patient.  Qualifies for Shingles Vaccine? yes  Screening Tests Health Maintenance  Topic Date Due  . Hepatitis C Screening  08-16-1945  . INFLUENZA VACCINE  04/10/2019 (Originally 08/11/2018)  . MAMMOGRAM  01/14/2020 (Originally 06/01/1994)  . COLONOSCOPY  01/14/2020 (Originally 06/01/1994)  . TETANUS/TDAP  01/14/2020 (Originally 06/01/1963)  . PNA vac Low Risk Adult (1 of 2 - PCV13) 01/14/2020 (Originally 05/31/2009)  . DEXA SCAN  Completed    Cancer Screenings: Lung: Low Dose CT Chest recommended if Age 34-80 years, 30 pack-year currently smoking OR have quit w/in 15years. Patient does not qualify. Breast:  Up to date on Mammogram? No, declined   Up to date of Bone Density/Dexa? Yes, completed 12/13/2017 Colorectal: declined  Additional Screenings:  Hepatitis C Screening: due     Plan:    Patient will try to start exercising and walking more daily.   I have personally reviewed and noted the following in the patient's chart:   . Medical and social history . Use of alcohol, tobacco or illicit drugs  . Current medications and supplements . Functional ability and status . Nutritional status . Physical activity . Advanced directives . List of other physicians . Hospitalizations, surgeries, and ER visits in previous 12  months . Vitals . Screenings to include cognitive, depression, and falls . Referrals and appointments  In addition, I have reviewed and discussed with patient certain preventive protocols, quality metrics, and best practice recommendations. A written personalized care plan for preventive services as well as general preventive health recommendations were provided to patient.     Andrez Grime, LPN  03/11/5496

## 2019-01-14 NOTE — Patient Instructions (Signed)
Danielle Zamora , Thank you for taking time to come for your Medicare Wellness Visit. I appreciate your ongoing commitment to your health goals. Please review the following plan we discussed and let me know if I can assist you in the future.   Screening recommendations/referrals: Colonoscopy: declined Mammogram: declined Bone Density: Up to date, completed 12/13/2017 Recommended yearly ophthalmology/optometry visit for glaucoma screening and checkup Recommended yearly dental visit for hygiene and checkup  Vaccinations: Influenza vaccine: declined Pneumococcal vaccine: declined Tdap vaccine: decline Shingles vaccine: discussed    Advanced directives: Advance directive discussed with you today. I have provided a copy for you to complete at home and have notarized. Once this is complete please bring a copy in to our office so we can scan it into your chart.  Conditions/risks identified: hypertension  Next appointment: 01/21/2019 @ 11:40 am    Preventive Care 65 Years and Older, Female Preventive care refers to lifestyle choices and visits with your health care provider that can promote health and wellness. What does preventive care include?  A yearly physical exam. This is also called an annual well check.  Dental exams once or twice a year.  Routine eye exams. Ask your health care provider how often you should have your eyes checked.  Personal lifestyle choices, including:  Daily care of your teeth and gums.  Regular physical activity.  Eating a healthy diet.  Avoiding tobacco and drug use.  Limiting alcohol use.  Practicing safe sex.  Taking low-dose aspirin every day.  Taking vitamin and mineral supplements as recommended by your health care provider. What happens during an annual well check? The services and screenings done by your health care provider during your annual well check will depend on your age, overall health, lifestyle risk factors, and family history of  disease. Counseling  Your health care provider may ask you questions about your:  Alcohol use.  Tobacco use.  Drug use.  Emotional well-being.  Home and relationship well-being.  Sexual activity.  Eating habits.  History of falls.  Memory and ability to understand (cognition).  Work and work Statistician.  Reproductive health. Screening  You may have the following tests or measurements:  Height, weight, and BMI.  Blood pressure.  Lipid and cholesterol levels. These may be checked every 5 years, or more frequently if you are over 29 years old.  Skin check.  Lung cancer screening. You may have this screening every year starting at age 46 if you have a 30-pack-year history of smoking and currently smoke or have quit within the past 15 years.  Fecal occult blood test (FOBT) of the stool. You may have this test every year starting at age 53.  Flexible sigmoidoscopy or colonoscopy. You may have a sigmoidoscopy every 5 years or a colonoscopy every 10 years starting at age 18.  Hepatitis C blood test.  Hepatitis B blood test.  Sexually transmitted disease (STD) testing.  Diabetes screening. This is done by checking your blood sugar (glucose) after you have not eaten for a while (fasting). You may have this done every 1-3 years.  Bone density scan. This is done to screen for osteoporosis. You may have this done starting at age 11.  Mammogram. This may be done every 1-2 years. Talk to your health care provider about how often you should have regular mammograms. Talk with your health care provider about your test results, treatment options, and if necessary, the need for more tests. Vaccines  Your health care provider may recommend  certain vaccines, such as:  Influenza vaccine. This is recommended every year.  Tetanus, diphtheria, and acellular pertussis (Tdap, Td) vaccine. You may need a Td booster every 10 years.  Zoster vaccine. You may need this after age  52.  Pneumococcal 13-valent conjugate (PCV13) vaccine. One dose is recommended after age 28.  Pneumococcal polysaccharide (PPSV23) vaccine. One dose is recommended after age 13. Talk to your health care provider about which screenings and vaccines you need and how often you need them. This information is not intended to replace advice given to you by your health care provider. Make sure you discuss any questions you have with your health care provider. Document Released: 01/23/2015 Document Revised: 09/16/2015 Document Reviewed: 10/28/2014 Elsevier Interactive Patient Education  2017 [place] Prevention in the Home Falls can cause injuries. They can happen to people of all ages. There are many things you can do to make your home safe and to help prevent falls. What can I do on the outside of my home?  Regularly fix the edges of walkways and driveways and fix any cracks.  Remove anything that might make you trip as you walk through a door, such as a raised step or threshold.  Trim any bushes or trees on the path to your home.  Use bright outdoor lighting.  Clear any walking paths of anything that might make someone trip, such as rocks or tools.  Regularly check to see if handrails are loose or broken. Make sure that both sides of any steps have handrails.  Any raised decks and porches should have guardrails on the edges.  Have any leaves, snow, or ice cleared regularly.  Use sand or salt on walking paths during winter.  Clean up any spills in your garage right away. This includes oil or grease spills. What can I do in the bathroom?  Use night lights.  Install grab bars by the toilet and in the tub and shower. Do not use towel bars as grab bars.  Use non-skid mats or decals in the tub or shower.  If you need to sit down in the shower, use a plastic, non-slip stool.  Keep the floor dry. Clean up any water that spills on the floor as soon as it happens.  Remove  soap buildup in the tub or shower regularly.  Attach bath mats securely with double-sided non-slip rug tape.  Do not have throw rugs and other things on the floor that can make you trip. What can I do in the bedroom?  Use night lights.  Make sure that you have a light by your bed that is easy to reach.  Do not use any sheets or blankets that are too big for your bed. They should not hang down onto the floor.  Have a firm chair that has side arms. You can use this for support while you get dressed.  Do not have throw rugs and other things on the floor that can make you trip. What can I do in the kitchen?  Clean up any spills right away.  Avoid walking on wet floors.  Keep items that you use a lot in easy-to-reach places.  If you need to reach something above you, use a strong step stool that has a grab bar.  Keep electrical cords out of the way.  Do not use floor polish or wax that makes floors slippery. If you must use wax, use non-skid floor wax.  Do not have throw rugs and other  things on the floor that can make you trip. What can I do with my stairs?  Do not leave any items on the stairs.  Make sure that there are handrails on both sides of the stairs and use them. Fix handrails that are broken or loose. Make sure that handrails are as long as the stairways.  Check any carpeting to make sure that it is firmly attached to the stairs. Fix any carpet that is loose or worn.  Avoid having throw rugs at the top or bottom of the stairs. If you do have throw rugs, attach them to the floor with carpet tape.  Make sure that you have a light switch at the top of the stairs and the bottom of the stairs. If you do not have them, ask someone to add them for you. What else can I do to help prevent falls?  Wear shoes that:  Do not have high heels.  Have rubber bottoms.  Are comfortable and fit you well.  Are closed at the toe. Do not wear sandals.  If you use a  stepladder:  Make sure that it is fully opened. Do not climb a closed stepladder.  Make sure that both sides of the stepladder are locked into place.  Ask someone to hold it for you, if possible.  Clearly mark and make sure that you can see:  Any grab bars or handrails.  First and last steps.  Where the edge of each step is.  Use tools that help you move around (mobility aids) if they are needed. These include:  Canes.  Walkers.  Scooters.  Crutches.  Turn on the lights when you go into a dark area. Replace any light bulbs as soon as they burn out.  Set up your furniture so you have a clear path. Avoid moving your furniture around.  If any of your floors are uneven, fix them.  If there are any pets around you, be aware of where they are.  Review your medicines with your doctor. Some medicines can make you feel dizzy. This can increase your chance of falling. Ask your doctor what other things that you can do to help prevent falls. This information is not intended to replace advice given to you by your health care provider. Make sure you discuss any questions you have with your health care provider. Document Released: 10/23/2008 Document Revised: 06/04/2015 Document Reviewed: 01/31/2014 Elsevier Interactive Patient Education  2017 [employer].

## 2019-01-17 ENCOUNTER — Other Ambulatory Visit (INDEPENDENT_AMBULATORY_CARE_PROVIDER_SITE_OTHER): Payer: Medicare Other

## 2019-01-17 ENCOUNTER — Other Ambulatory Visit: Payer: Self-pay

## 2019-01-17 DIAGNOSIS — D510 Vitamin B12 deficiency anemia due to intrinsic factor deficiency: Secondary | ICD-10-CM

## 2019-01-17 DIAGNOSIS — I10 Essential (primary) hypertension: Secondary | ICD-10-CM

## 2019-01-17 LAB — COMPREHENSIVE METABOLIC PANEL
ALT: 14 U/L (ref 0–35)
AST: 18 U/L (ref 0–37)
Albumin: 4.4 g/dL (ref 3.5–5.2)
Alkaline Phosphatase: 75 U/L (ref 39–117)
BUN: 15 mg/dL (ref 6–23)
CO2: 26 mEq/L (ref 19–32)
Calcium: 12.2 mg/dL — ABNORMAL HIGH (ref 8.4–10.5)
Chloride: 105 mEq/L (ref 96–112)
Creatinine, Ser: 1.25 mg/dL — ABNORMAL HIGH (ref 0.40–1.20)
GFR: 50.6 mL/min — ABNORMAL LOW (ref >60.00)
Glucose, Bld: 116 mg/dL — ABNORMAL HIGH (ref 70–99)
Potassium: 4.3 mEq/L (ref 3.5–5.1)
Sodium: 139 mEq/L (ref 135–145)
Total Bilirubin: 1.1 mg/dL (ref 0.2–1.2)
Total Protein: 7.8 g/dL (ref 6.0–8.3)

## 2019-01-17 LAB — CBC
HCT: 50.7 % — ABNORMAL HIGH (ref 36.0–46.0)
Hemoglobin: 17 g/dL — ABNORMAL HIGH (ref 12.0–15.0)
MCHC: 33.5 g/dL (ref 30.0–36.0)
MCV: 99 fl (ref 78.0–100.0)
Platelets: 201 10*3/uL (ref 150.0–400.0)
RBC: 5.12 Mil/uL — ABNORMAL HIGH (ref 3.87–5.11)
RDW: 14.5 % (ref 11.5–15.5)
WBC: 7 10*3/uL (ref 4.0–10.5)

## 2019-01-17 LAB — LIPID PANEL
Cholesterol: 268 mg/dL — ABNORMAL HIGH (ref 0–200)
HDL: 57.7 mg/dL (ref >39.00)
NonHDL: 210.03
Total CHOL/HDL Ratio: 5
Triglycerides: 219 mg/dL — ABNORMAL HIGH (ref 0.0–149.0)
VLDL: 43.8 mg/dL — ABNORMAL HIGH (ref 0.0–40.0)

## 2019-01-17 LAB — LDL CHOLESTEROL, DIRECT: Direct LDL: 176 mg/dL

## 2019-01-17 LAB — VITAMIN B12: Vitamin B-12: 179 pg/mL — ABNORMAL LOW (ref 211–911)

## 2019-01-21 ENCOUNTER — Other Ambulatory Visit: Payer: Self-pay

## 2019-01-21 ENCOUNTER — Ambulatory Visit (INDEPENDENT_AMBULATORY_CARE_PROVIDER_SITE_OTHER): Payer: Medicare Other | Admitting: Primary Care

## 2019-01-21 ENCOUNTER — Encounter: Payer: Self-pay | Admitting: Primary Care

## 2019-01-21 VITALS — BP 148/98 | HR 65 | Temp 96.6°F | Ht 63.0 in | Wt 198.5 lb

## 2019-01-21 DIAGNOSIS — R739 Hyperglycemia, unspecified: Secondary | ICD-10-CM | POA: Insufficient documentation

## 2019-01-21 DIAGNOSIS — E785 Hyperlipidemia, unspecified: Secondary | ICD-10-CM

## 2019-01-21 DIAGNOSIS — Z122 Encounter for screening for malignant neoplasm of respiratory organs: Secondary | ICD-10-CM

## 2019-01-21 DIAGNOSIS — D510 Vitamin B12 deficiency anemia due to intrinsic factor deficiency: Secondary | ICD-10-CM | POA: Diagnosis not present

## 2019-01-21 DIAGNOSIS — E213 Hyperparathyroidism, unspecified: Secondary | ICD-10-CM

## 2019-01-21 DIAGNOSIS — Z1211 Encounter for screening for malignant neoplasm of colon: Secondary | ICD-10-CM

## 2019-01-21 DIAGNOSIS — Z720 Tobacco use: Secondary | ICD-10-CM | POA: Diagnosis not present

## 2019-01-21 DIAGNOSIS — R7303 Prediabetes: Secondary | ICD-10-CM | POA: Insufficient documentation

## 2019-01-21 DIAGNOSIS — N183 Chronic kidney disease, stage 3 unspecified: Secondary | ICD-10-CM

## 2019-01-21 DIAGNOSIS — E559 Vitamin D deficiency, unspecified: Secondary | ICD-10-CM

## 2019-01-21 DIAGNOSIS — M810 Age-related osteoporosis without current pathological fracture: Secondary | ICD-10-CM

## 2019-01-21 DIAGNOSIS — I10 Essential (primary) hypertension: Secondary | ICD-10-CM

## 2019-01-21 MED ORDER — ALBUTEROL SULFATE HFA 108 (90 BASE) MCG/ACT IN AERS: 2.0000 | INHALATION_SPRAY | Freq: Four times a day (QID) | RESPIRATORY_TRACT | 0 refills | Status: DC | PRN

## 2019-01-21 MED ORDER — CYANOCOBALAMIN 1000 MCG/ML IJ SOLN
1000.0000 ug | Freq: Once | INTRAMUSCULAR | Status: AC
  Administered 2019-01-21: 1000 ug via INTRAMUSCULAR

## 2019-01-21 MED ORDER — ATORVASTATIN CALCIUM 40 MG PO TABS: 40.0000 mg | ORAL_TABLET | Freq: Every evening | ORAL | 3 refills | Status: DC

## 2019-01-21 NOTE — Assessment & Plan Note (Signed)
Noted on bone density scan from 2019, compliant to annual injections of Reclast per endocrinology.

## 2019-01-21 NOTE — Assessment & Plan Note (Signed)
No longer on oral B12 or receiving injections. Recent B12 level of 179 which is too low.  Will provide IM B12 today, repeat in 2 weeks, then begin monthly injections. Repeat labs in 4 months.

## 2019-01-21 NOTE — Assessment & Plan Note (Signed)
Stable overall.  Continue to monitor.  Continue ARB.

## 2019-01-21 NOTE — Patient Instructions (Addendum)
Start atorvastatin 40 mg once every evening for cholesterol.  Start monitoring your blood pressure daily, around the same time of day, for the next 2-3 weeks.  Ensure that you have rested for 30 minutes prior to checking your blood pressure. Record your readings and call me if you see readings consistently at or above 140/90.  You will be contacted regarding your referral to lung cancer screening.  Please let us know if you have not been contacted within two weeks.   Complete the colon cancer screening kit once received.   Schedule a nurse visit to return in 2 weeks for another B12 injection. Schedule monthly nurse visits for B12 injections.  Schedule a lab only appointment for 2 months to repeat cholesterol.   It was a pleasure to see you today!

## 2019-01-21 NOTE — Assessment & Plan Note (Signed)
Compliant to vitamin D 50,000 units weekly, continue same.

## 2019-01-21 NOTE — Assessment & Plan Note (Signed)
Following with endocrinology. Continue current regimen.

## 2019-01-21 NOTE — Assessment & Plan Note (Signed)
Above goal in the office today, also on recheck. BP from notes in December 2020 with visit at Dr. Maeola Harman office is much improved.  Discussed for her to purchase a home cuff for monitoring, she will do this and update.

## 2019-01-21 NOTE — Addendum Note (Signed)
Addended by: Jacqualin Combes on: 01/21/2019 12:55 PM   Modules accepted: Orders

## 2019-01-21 NOTE — Assessment & Plan Note (Signed)
Noted on recent labs, will obtain A1C with next lab draw as our POC A1C machine is out of service.

## 2019-01-21 NOTE — Progress Notes (Signed)
Subjective:    Patient ID: Danielle Zamora, female    DOB: 01/01/1946, 74 y.o.   MRN: [id number]  HPI  This visit occurred during the SARS-CoV-2 public health emergency.  Safety protocols were in place, including screening questions prior to the visit, additional usage of staff PPE, and extensive cleaning of exam room while observing appropriate contact time as indicated for disinfecting solutions.   Danielle Zamora is a 74 year old female who presents today for [place] Part 2. She spoke with our health advisor last week. She is also following with Dr. Ronnald Collum with her last visit being in December 2020. During her visit in December her blood pressure was 126/74. She does not check her BP at home.   She admits that she doesn't take the HCTZ on a daily basis as she uses it PRN for lower extremity swelling.   She is not taking B12 tablets, has not had injections in quite some time.   Immunizations: -Influenza: Declines  -Shingles: Never completed, declines  -Pneumonia: Never completed, declines   Mammogram: No recent mammogram, declines  Dexa: Completed in December 2019, osteoporosis.  Colonoscopy: Never completed, opts for Cologuard. Hep C Screen: Due Lung Cancer Screening: No recent imaging   BP Readings from Last 3 Encounters:  01/21/19 (!) 148/98  11/22/17 (!) 156/82  10/09/17 (!) 180/100   The 10-year ASCVD risk score Mikey Bussing DC Jr., et al., 2013) is: 41.3%   Values used to calculate the score:     Age: 74 years     Sex: Female     Is Non-Hispanic African American: Yes     Diabetic: No     Tobacco smoker: Yes     Systolic Blood Pressure: 254 mmHg     Is BP treated: Yes     HDL Cholesterol: 57.7 mg/dL     Total Cholesterol: 268 mg/dL   Review of Systems  Respiratory:       Dyspnea with moderate exertion   Cardiovascular: Negative for chest pain.       Right ankle edema  Musculoskeletal: Positive for arthralgias.  Neurological: Negative for dizziness and headaches.       Past  Medical History:  Diagnosis Date  . Essential hypertension   . Pernicious anemia      Social History   Socioeconomic History  . Marital status: Widowed    Spouse name: Not on file  . Number of children: Not on file  . Years of education: Not on file  . Highest education level: Not on file  Occupational History  . Not on file  Tobacco Use  . Smoking status: Current Every Day Smoker    Packs/day: 0.50    Years: 40.00    Pack years: 20.00    Types: Cigarettes  . Smokeless tobacco: Never Used  Substance and Sexual Activity  . Alcohol use: Yes    Alcohol/week: 2.0 standard drinks    Types: 2 Shots of liquor per week    Comment: daily  . Drug use: Yes    Types: Marijuana    Comment: use it rarely  . Sexual activity: Not on file  Other Topics Concern  . Not on file  Social History Narrative   Widow.   3 children, 9 grandchildren   Retired.    Moved from [place].   Enjoys spending time with family.   Social Determinants of Health   Financial Resource Strain: Low Risk   . Difficulty of Paying Living Expenses: Not hard at  all  Food Insecurity: No Food Insecurity  . Worried About Charity fundraiser in the Last Year: Never true  . Ran Out of Food in the Last Year: Never true  Transportation Needs: No Transportation Needs  . Lack of Transportation (Medical): No  . Lack of Transportation (Non-Medical): No  Physical Activity: Inactive  . Days of Exercise per Week: 0 days  . Minutes of Exercise per Session: 0 min  Stress: No Stress Concern Present  . Feeling of Stress : Only a little  Social Connections:   . Frequency of Communication with Friends and Family: Not on file  . Frequency of Social Gatherings with Friends and Family: Not on file  . Attends Religious Services: Not on file  . Active Member of Clubs or Organizations: Not on file  . Attends Archivist Meetings: Not on file  . Marital Status: Not on file  Intimate Partner Violence: Not At Risk  .  Fear of Current or Ex-Partner: No  . Emotionally Abused: No  . Physically Abused: No  . Sexually Abused: No    No past surgical history on file.  Family History  Problem Relation Age of Onset  . Kidney disease Mother   . Breast cancer Sister     Allergies  Allergen Reactions  . Penicillins Swelling    Has patient had a PCN reaction causing immediate rash, facial/tongue/throat swelling, SOB or lightheadedness with hypotension: Yes Has patient had a PCN reaction causing severe rash involving mucus membranes or skin necrosis: Yes Has patient had a PCN reaction that required hospitalization: No Has patient had a PCN reaction occurring within the last 10 years: No If all of the above answers are "NO", then may proceed with Cephalosporin use.     Current Outpatient Medications on File Prior to Visit  Medication Sig Dispense Refill  . amLODipine (NORVASC) 10 MG tablet TAKE 1 TABLET BY MOUTH EVERY DAY 90 tablet 1  . calcitRIOL (ROCALTROL) 0.5 MCG capsule TK ONE C PO QD    . hydrochlorothiazide (HYDRODIURIL) 12.5 MG tablet Take 12.5 mg by mouth daily.    . irbesartan (AVAPRO) 300 MG tablet TAKE 1 TABLET BY MOUTH EVERY DAY FOR BLOOD PRESSURE 90 tablet 1  . metoprolol succinate (TOPROL-XL) 25 MG 24 hr tablet TAKE 1 TABLET(25 MG) BY MOUTH DAILY FOR BLOOD PRESSURE 90 tablet 1  . Vitamin D, Ergocalciferol, (DRISDOL) 1.25 MG (50000 UNIT) CAPS capsule Take 50,000 Units by mouth every 7 (seven) days.    . zoledronic acid (RECLAST) 5 MG/100ML SOLN injection Inject 5 mg into the vein once.     No current facility-administered medications on file prior to visit.    BP (!) 148/98   Pulse 65   Temp (!) 96.6 F (35.9 C) (Temporal)   Ht 5\' 3"  (1.6 m)   Wt 198 lb 8 oz (90 kg)   SpO2 98%   BMI 35.16 kg/m    Objective:   Physical Exam  Constitutional: She is oriented to person, place, and time. She appears well-nourished. She does not appear ill.  HENT:  Right Ear: Tympanic membrane and ear  canal normal.  Left Ear: Tympanic membrane and ear canal normal.  Nose: No mucosal edema. Right sinus exhibits no maxillary sinus tenderness and no frontal sinus tenderness. Left sinus exhibits no maxillary sinus tenderness and no frontal sinus tenderness.  Mouth/Throat: Oropharynx is clear and moist.  Cardiovascular: Normal rate and regular rhythm.  Respiratory: Effort normal and breath sounds normal.  She has no wheezes.  Musculoskeletal:     Cervical back: Neck supple.  Neurological: She is alert and oriented to person, place, and time.  Skin: Skin is warm and dry.  Psychiatric: She has a normal mood and affect.           Assessment & Plan:

## 2019-01-21 NOTE — Assessment & Plan Note (Signed)
50+ pack year history, not wanting to quit. Referral for lung cancer screening provided. Orders for Cologuard completed.

## 2019-01-22 ENCOUNTER — Telehealth: Payer: Self-pay | Admitting: *Deleted

## 2019-01-22 ENCOUNTER — Encounter: Payer: Self-pay | Admitting: *Deleted

## 2019-01-22 DIAGNOSIS — Z87891 Personal history of nicotine dependence: Secondary | ICD-10-CM

## 2019-01-22 NOTE — Telephone Encounter (Signed)
Received referral for initial lung cancer screening scan. Contacted patient and obtained smoking history,(current, 42 pack year) as well as answering questions related to screening process. Patient denies signs of lung cancer such as weight loss or hemoptysis. Patient denies comorbidity that would prevent curative treatment if lung cancer were found. Patient is scheduled for shared decision making visit and CT scan on 01/29/19 at 145pm.

## 2019-01-29 ENCOUNTER — Inpatient Hospital Stay: Payer: Medicare Other | Admitting: Oncology

## 2019-01-29 ENCOUNTER — Ambulatory Visit: Payer: Medicare Other

## 2019-02-07 ENCOUNTER — Other Ambulatory Visit: Payer: Self-pay

## 2019-02-07 ENCOUNTER — Telehealth: Payer: Self-pay | Admitting: *Deleted

## 2019-02-07 ENCOUNTER — Ambulatory Visit (INDEPENDENT_AMBULATORY_CARE_PROVIDER_SITE_OTHER): Payer: Medicare Other

## 2019-02-07 DIAGNOSIS — Z87891 Personal history of nicotine dependence: Secondary | ICD-10-CM

## 2019-02-07 DIAGNOSIS — D510 Vitamin B12 deficiency anemia due to intrinsic factor deficiency: Secondary | ICD-10-CM

## 2019-02-07 MED ORDER — CYANOCOBALAMIN 1000 MCG/ML IJ SOLN
1000.0000 ug | Freq: Once | INTRAMUSCULAR | Status: AC
  Administered 2019-02-07: 15:00:00 1000 ug via INTRAMUSCULAR

## 2019-02-07 NOTE — Progress Notes (Signed)
Per orders of Dr. Duncan, injection of vit B12 given by Citlally Captain. Patient tolerated injection well.  

## 2019-02-07 NOTE — Telephone Encounter (Signed)
Received referral for initial lung cancer screening scan. Contacted patient and obtained smoking history,(current, 42 pack year) as well as answering questions related to screening process. Patient denies signs of lung cancer such as weight loss or hemoptysis. Patient denies comorbidity that would prevent curative treatment if lung cancer were found. Patient is scheduled for shared decision making visit and CT scan on 02/12/19 at 2pm.

## 2019-02-12 ENCOUNTER — Ambulatory Visit
Admission: RE | Admit: 2019-02-12 | Discharge: 2019-02-12 | Disposition: A | Discharge status: Home / Self Care | Payer: Medicare Other | Source: Ambulatory Visit | Attending: Nurse Practitioner | Admitting: Nurse Practitioner

## 2019-02-12 ENCOUNTER — Other Ambulatory Visit: Payer: Self-pay | Admitting: Primary Care

## 2019-02-12 ENCOUNTER — Other Ambulatory Visit: Payer: Self-pay

## 2019-02-12 ENCOUNTER — Inpatient Hospital Stay (HOSPITAL_BASED_OUTPATIENT_CLINIC_OR_DEPARTMENT_OTHER): Payer: Medicare Other | Admitting: Oncology

## 2019-02-12 DIAGNOSIS — Z122 Encounter for screening for malignant neoplasm of respiratory organs: Secondary | ICD-10-CM

## 2019-02-12 DIAGNOSIS — Z87891 Personal history of nicotine dependence: Secondary | ICD-10-CM | POA: Insufficient documentation

## 2019-02-12 DIAGNOSIS — Z720 Tobacco use: Secondary | ICD-10-CM

## 2019-02-12 NOTE — Progress Notes (Signed)
Virtual Visit via Video Note  I connected with Danielle Zamora on 02/12/19 at  2:00 PM EST by a video enabled telemedicine application and verified that I am speaking with the correct person using two identifiers.  Location: Patient: [facility] Provider: Home   I discussed the limitations of evaluation and management by telemedicine and the availability of in person appointments. The patient expressed understanding and agreed to proceed.  I discussed the assessment and treatment plan with the patient. The patient was provided an opportunity to ask questions and all were answered. The patient agreed with the plan and demonstrated an understanding of the instructions.   The patient was advised to call back or seek an in-person evaluation if the symptoms worsen or if the condition fails to improve as anticipated.   In accordance with CMS guidelines, patient has met eligibility criteria including age, absence of signs or symptoms of lung cancer.  Social History   Tobacco Use  . Smoking status: Current Every Day Smoker    Packs/day: 0.75    Years: 56.00    Pack years: 42.00    Types: Cigarettes  . Smokeless tobacco: Never Used  Substance Use Topics  . Alcohol use: Yes    Alcohol/week: 2.0 standard drinks    Types: 2 Shots of liquor per week    Comment: daily  . Drug use: Yes    Types: Marijuana    Comment: use it rarely      A shared decision-making session was conducted prior to the performance of CT scan. This includes one or more decision aids, includes benefits and harms of screening, follow-up diagnostic testing, over-diagnosis, false positive rate, and total radiation exposure.   Counseling on the importance of adherence to annual lung cancer LDCT screening, impact of co-morbidities, and ability or willingness to undergo diagnosis and treatment is imperative for compliance of the program.   Counseling on the importance of continued smoking cessation for former smokers; the importance of  smoking cessation for current smokers, and information about tobacco cessation interventions have been given to patient including Danielle Zamora and 1800 quit Danielle Zamora programs.   Written order for lung cancer screening with LDCT has been given to the patient and any and all questions have been answered to the best of my abilities.    Yearly follow up will be coordinated by Burgess Estelle, Thoracic Navigator.  I provided 15 minutes of face-to-face video visit time during this encounter, and > 50% was spent counseling as documented under my assessment & plan.   Jacquelin Hawking, NP

## 2019-02-14 ENCOUNTER — Encounter: Payer: Self-pay | Admitting: *Deleted

## 2019-03-04 ENCOUNTER — Other Ambulatory Visit: Payer: Self-pay | Admitting: Primary Care

## 2019-03-04 DIAGNOSIS — I10 Essential (primary) hypertension: Secondary | ICD-10-CM

## 2019-03-15 ENCOUNTER — Other Ambulatory Visit: Payer: Self-pay | Admitting: Primary Care

## 2019-03-15 DIAGNOSIS — E559 Vitamin D deficiency, unspecified: Secondary | ICD-10-CM

## 2019-03-15 DIAGNOSIS — Z1159 Encounter for screening for other viral diseases: Secondary | ICD-10-CM

## 2019-03-15 DIAGNOSIS — R739 Hyperglycemia, unspecified: Secondary | ICD-10-CM

## 2019-03-15 DIAGNOSIS — D510 Vitamin B12 deficiency anemia due to intrinsic factor deficiency: Secondary | ICD-10-CM

## 2019-03-15 DIAGNOSIS — E785 Hyperlipidemia, unspecified: Secondary | ICD-10-CM

## 2019-03-21 ENCOUNTER — Other Ambulatory Visit: Payer: Self-pay

## 2019-03-21 ENCOUNTER — Telehealth: Payer: Self-pay

## 2019-03-21 ENCOUNTER — Other Ambulatory Visit (INDEPENDENT_AMBULATORY_CARE_PROVIDER_SITE_OTHER): Payer: Medicare Other

## 2019-03-21 DIAGNOSIS — Z1159 Encounter for screening for other viral diseases: Secondary | ICD-10-CM

## 2019-03-21 DIAGNOSIS — D510 Vitamin B12 deficiency anemia due to intrinsic factor deficiency: Secondary | ICD-10-CM

## 2019-03-21 DIAGNOSIS — E785 Hyperlipidemia, unspecified: Secondary | ICD-10-CM | POA: Diagnosis not present

## 2019-03-21 DIAGNOSIS — E559 Vitamin D deficiency, unspecified: Secondary | ICD-10-CM

## 2019-03-21 DIAGNOSIS — R739 Hyperglycemia, unspecified: Secondary | ICD-10-CM | POA: Diagnosis not present

## 2019-03-21 LAB — LIPID PANEL
Cholesterol: 188 mg/dL (ref 0–200)
HDL: 61.6 mg/dL (ref >39.00)
LDL Cholesterol: 108 mg/dL — ABNORMAL HIGH (ref 0–99)
NonHDL: 126
Total CHOL/HDL Ratio: 3
Triglycerides: 88 mg/dL (ref 0.0–149.0)
VLDL: 17.6 mg/dL (ref 0.0–40.0)

## 2019-03-21 LAB — VITAMIN D 25 HYDROXY (VIT D DEFICIENCY, FRACTURES): VITD: >120 ng/mL

## 2019-03-21 LAB — VITAMIN B12: Vitamin B-12: 696 pg/mL (ref 211–911)

## 2019-03-21 LAB — HEMOGLOBIN A1C: Hgb A1c MFr Bld: 6.2 % (ref 4.6–6.5)

## 2019-03-21 NOTE — Telephone Encounter (Signed)
[facility] called critical result @ 1651   Vit D greateer than 120

## 2019-03-21 NOTE — Telephone Encounter (Signed)
Spoken to patient and made her aware that she needs to come the lab appointment

## 2019-03-21 NOTE — Telephone Encounter (Signed)
Please notify pt.  Would stop any extra vitamin D at this point.  Will route to PCP for input and possible recheck vit D in a few months.  Thanks.

## 2019-03-21 NOTE — Telephone Encounter (Signed)
[place] Night - Client Nonclinical Telephone Record AccessNurse Client [facility] Night - Client Client Site [facility] - Night Physician [facility] - PHYSICIAN, Verita Schneiders- MD Contact Type Call Who Is Calling Patient / Member / Family / Caregiver Caller Name Leonora Gores Caller Phone Number [phone] Patient Name Danielle Zamora Patient DOB Oct 17, 1945 Call Type Message Only Information Provided Reason for Call Request for General Office Information Initial Comment Caller states she has an appointment tomorrow and she was wanting to know if she needed to get labs at her next. Guerry Minors -- not listed. Additional Comment Provided caller with office hours from profile. Disp. Time Disposition Final User 03/20/2019 5:13:34 PM General Information Provided Yes Hassie Bruce Call Closed By: Hassie Bruce Transaction Date/Time: 03/20/2019 5:10:03 PM (ET)

## 2019-03-21 NOTE — Telephone Encounter (Signed)
Danielle Zamora called critical results @ [facility] [facility] than 120

## 2019-03-21 NOTE — Telephone Encounter (Signed)
Pt wants to know if she needs to have lab work done today since had labs done on 03/18/19 at Dr [employer] office. Pt is not sure what labs were done on 03/18/19. Pt request cb.

## 2019-03-21 NOTE — Telephone Encounter (Signed)
Patient advised.  Patient is currently taking 50,000 units twice a week.  Please advise.

## 2019-03-21 NOTE — Telephone Encounter (Signed)
She is followed by nephrology who has her on this level of vitamin D.  Danielle Zamora, please have her communicate with her nephrologist regarding her very high level. Let's plan to repeat her vitamin D in 2 months unless she'll be seeing her nephrologist by then.

## 2019-03-22 LAB — HEPATITIS C ANTIBODY
Hepatitis C Ab: NONREACTIVE
SIGNAL TO CUT-OFF: 0.02 (ref <1.00)

## 2019-03-22 NOTE — Telephone Encounter (Signed)
Spoken and notified patient of Danielle Zamora comments. Patient verbalized understanding. She will update. She has an appointment on Monday 03/25/2019 and will let him know

## 2019-03-22 NOTE — Telephone Encounter (Signed)
I appreciate the help of all involved.  I will defer otherwise.

## 2019-03-22 NOTE — Telephone Encounter (Signed)
error 

## 2019-03-25 NOTE — Addendum Note (Signed)
Addended by: Jacqualin Combes on: 03/25/2019 03:10 PM   Modules accepted: Orders

## 2019-03-25 NOTE — Addendum Note (Signed)
Addended by: Jacqualin Combes on: 03/25/2019 04:08 PM   Modules accepted: Orders

## 2019-03-26 ENCOUNTER — Other Ambulatory Visit: Payer: Self-pay | Admitting: Primary Care

## 2019-03-26 DIAGNOSIS — E785 Hyperlipidemia, unspecified: Secondary | ICD-10-CM

## 2019-03-26 MED ORDER — ROSUVASTATIN CALCIUM 20 MG PO TABS: 20.0000 mg | ORAL_TABLET | Freq: Every evening | ORAL | 3 refills | Status: DC

## 2019-03-28 ENCOUNTER — Telehealth: Payer: Self-pay | Admitting: Primary Care

## 2019-03-28 NOTE — Telephone Encounter (Signed)
Pt calling wanting to speak with Vallarie Mare regarding her medications. She was very adamant to speak with Vallarie Mare. Aware that I will send a note because was already gone for the day.

## 2019-04-04 ENCOUNTER — Other Ambulatory Visit: Payer: Self-pay | Admitting: Primary Care

## 2019-04-04 DIAGNOSIS — I10 Essential (primary) hypertension: Secondary | ICD-10-CM

## 2019-04-20 ENCOUNTER — Other Ambulatory Visit: Payer: Self-pay | Admitting: Primary Care

## 2019-04-20 DIAGNOSIS — I10 Essential (primary) hypertension: Secondary | ICD-10-CM

## 2019-05-27 ENCOUNTER — Ambulatory Visit (INDEPENDENT_AMBULATORY_CARE_PROVIDER_SITE_OTHER): Payer: Medicare Other | Admitting: Primary Care

## 2019-05-27 ENCOUNTER — Encounter: Payer: Self-pay | Admitting: Primary Care

## 2019-05-27 ENCOUNTER — Other Ambulatory Visit: Payer: Self-pay

## 2019-05-27 VITALS — BP 150/92 | HR 82 | Temp 96.2°F | Ht 63.0 in | Wt 194.0 lb

## 2019-05-27 DIAGNOSIS — E213 Hyperparathyroidism, unspecified: Secondary | ICD-10-CM

## 2019-05-27 DIAGNOSIS — E559 Vitamin D deficiency, unspecified: Secondary | ICD-10-CM | POA: Diagnosis not present

## 2019-05-27 DIAGNOSIS — D510 Vitamin B12 deficiency anemia due to intrinsic factor deficiency: Secondary | ICD-10-CM | POA: Diagnosis not present

## 2019-05-27 DIAGNOSIS — I10 Essential (primary) hypertension: Secondary | ICD-10-CM

## 2019-05-27 DIAGNOSIS — E790 Hyperuricemia without signs of inflammatory arthritis and tophaceous disease: Secondary | ICD-10-CM

## 2019-05-27 DIAGNOSIS — E785 Hyperlipidemia, unspecified: Secondary | ICD-10-CM | POA: Diagnosis not present

## 2019-05-27 DIAGNOSIS — R7303 Prediabetes: Secondary | ICD-10-CM

## 2019-05-27 DIAGNOSIS — R229 Localized swelling, mass and lump, unspecified: Secondary | ICD-10-CM

## 2019-05-27 NOTE — Assessment & Plan Note (Addendum)
Above goal in the office today despite three medications. Suspect hyperparathyroidism contributing. She will see endocrinology tomorrow and will ask about a plan regarding her hyperparathyroidism.   Asymptomatic. Consider cardiology evaluation.

## 2019-05-27 NOTE — Assessment & Plan Note (Signed)
Following with endocrinology, no recent visit but is scheduled for tomorrow. Suspect hyperparathyroidism is contributing to her elevated BP readings. Recommended she ask about a plan moving forward.

## 2019-05-27 NOTE — Patient Instructions (Signed)
Stop by the lab prior to leaving today. I will notify you of your results once received.   You will be contacted regarding the ultrasound to your arm.  Please let us know if you have not been contacted within two weeks.   Please ask about a plan for your hyperparathyroidism, I suspect this is contributing to your high blood pressure.  It was a pleasure to see you today!

## 2019-05-27 NOTE — Progress Notes (Signed)
Subjective:    Patient ID: Danielle Zamora, female    DOB: Jan 31, 1945, 74 y.o.   MRN: [id number]  HPI  This visit occurred during the SARS-CoV-2 public health emergency.  Safety protocols were in place, including screening questions prior to the visit, additional usage of staff PPE, and extensive cleaning of exam room while observing appropriate contact time as indicated for disinfecting solutions.   Danielle Zamora is a 74 year old female with a history of hypertension, hyperparathyroidism, osteoporosis, CKD, pernicious anemia, vitamin D deficiency, tobacco abuse who presents today for follow up and repeat lab testing.  She is currently managed on IM B12 for pernicious anemia. She is now on monthly B12 injections and is due for repeat B12 lab test today. She never returned for B12 injections as we discussed during her visit in January 2021. Her last B12 injection was 02/07/19. She does not take oral B12   Currently managed on irbesartan 300 mg, metoprolol succinate 25 mg, and amlodipine 10 mg for hypertension. She is scheduled to see her endocrinologist tomorrow for a review of her labs that were drawn in March 2021. She denies a history of gout, does have a uric acid level of 10 from labs in March 2021, has not been notified of this result. She denies foot pain, does have chronic joint pain from arthritis.   She also reports a mass to her left upper extremity that has been present for years. She denies changes in size, color. She denies pain, but sometimes the site will bother her if she lays on her arm.   She is compliant to her rosuvastatin 20 mg daily.   The 10-year ASCVD risk score Mikey Bussing DC Brooke Bonito., et al., 2013) is: 32.9%   Values used to calculate the score:     Age: 72 years     Sex: Female     Is Non-Hispanic African American: Yes     Diabetic: No     Tobacco smoker: Yes     Systolic Blood Pressure: 671 mmHg     Is BP treated: Yes     HDL Cholesterol: 61.6 mg/dL     Total Cholesterol: 188  mg/dL   BP Readings from Last 3 Encounters:  05/27/19 (!) 150/92  01/21/19 (!) 148/98  11/22/17 (!) 156/82     Review of Systems  Eyes: Negative for visual disturbance.  Respiratory: Negative for shortness of breath.   Cardiovascular: Negative for chest pain.  Musculoskeletal: Positive for arthralgias.  Neurological: Negative for dizziness and headaches.       Past Medical History:  Diagnosis Date  . Essential hypertension   . Pernicious anemia      Social History   Socioeconomic History  . Marital status: Widowed    Spouse name: Not on file  . Number of children: Not on file  . Years of education: Not on file  . Highest education level: Not on file  Occupational History  . Not on file  Tobacco Use  . Smoking status: Current Every Day Smoker    Packs/day: 0.75    Years: 56.00    Pack years: 42.00    Types: Cigarettes  . Smokeless tobacco: Never Used  Substance and Sexual Activity  . Alcohol use: Yes    Alcohol/week: 2.0 standard drinks    Types: 2 Shots of liquor per week    Comment: daily  . Drug use: Yes    Types: Marijuana    Comment: use it rarely  . Sexual  activity: Not on file  Other Topics Concern  . Not on file  Social History Narrative   Widow.   3 children, 9 grandchildren   Retired.    Moved from [place].   Enjoys spending time with family.   Social Determinants of Health   Financial Resource Strain: Low Risk   . Difficulty of Paying Living Expenses: Not hard at all  Food Insecurity: No Food Insecurity  . Worried About Charity fundraiser in the Last Year: Never true  . Ran Out of Food in the Last Year: Never true  Transportation Needs: No Transportation Needs  . Lack of Transportation (Medical): No  . Lack of Transportation (Non-Medical): No  Physical Activity: Inactive  . Days of Exercise per Week: 0 days  . Minutes of Exercise per Session: 0 min  Stress: No Stress Concern Present  . Feeling of Stress : Only a little  Social  Connections:   . Frequency of Communication with Friends and Family:   . Frequency of Social Gatherings with Friends and Family:   . Attends Religious Services:   . Active Member of Clubs or Organizations:   . Attends Archivist Meetings:   Marland Kitchen Marital Status:   Intimate Partner Violence: Not At Risk  . Fear of Current or Ex-Partner: No  . Emotionally Abused: No  . Physically Abused: No  . Sexually Abused: No    No past surgical history on file.  Family History  Problem Relation Age of Onset  . Kidney disease Mother   . Breast cancer Sister     Allergies  Allergen Reactions  . Penicillins Swelling    Has patient had a PCN reaction causing immediate rash, facial/tongue/throat swelling, SOB or lightheadedness with hypotension: Yes Has patient had a PCN reaction causing severe rash involving mucus membranes or skin necrosis: Yes Has patient had a PCN reaction that required hospitalization: No Has patient had a PCN reaction occurring within the last 10 years: No If all of the above answers are "NO", then may proceed with Cephalosporin use.     Current Outpatient Medications on File Prior to Visit  Medication Sig Dispense Refill  . albuterol (VENTOLIN HFA) 108 (90 Base) MCG/ACT inhaler INHALE 2 PUFFS INTO THE LUNGS EVERY 6 HOURS AS NEEDED FOR SHORTNESS OF BREATH 54 g 0  . amLODipine (NORVASC) 10 MG tablet TAKE 1 TABLET BY MOUTH EVERY DAY 90 tablet 1  . irbesartan (AVAPRO) 300 MG tablet TAKE 1 TABLET BY MOUTH EVERY DAY FOR BLOOD PRESSURE 90 tablet 1  . metoprolol succinate (TOPROL-XL) 25 MG 24 hr tablet TAKE 1 TABLET(25 MG) BY MOUTH DAILY FOR BLOOD PRESSURE 90 tablet 1  . rosuvastatin (CRESTOR) 20 MG tablet Take 1 tablet (20 mg total) by mouth every evening. For cholesterol. 90 tablet 3  . zoledronic acid (RECLAST) 5 MG/100ML SOLN injection Inject 5 mg into the vein once.     No current facility-administered medications on file prior to visit.    BP (!) 150/92   Pulse  82   Temp (!) 96.2 F (35.7 C) (Temporal)   Ht 5\' 3"  (1.6 m)   Wt 194 lb (88 kg)   SpO2 97%   BMI 34.37 kg/m    Objective:   Physical Exam  Constitutional: She appears well-nourished.  Cardiovascular: Normal rate and regular rhythm.  Respiratory: Effort normal and breath sounds normal.  Musculoskeletal:     Cervical back: Neck supple.  Skin: Skin is warm and dry.  Psychiatric:  She has a normal mood and affect.           Assessment & Plan:

## 2019-05-27 NOTE — Assessment & Plan Note (Signed)
Repeat vitamin D level pending, currently not taking any multivitamins.

## 2019-05-27 NOTE — Assessment & Plan Note (Signed)
A1C of 6.2 from a few months ago, encouraged a healthy diet and exercise.

## 2019-05-27 NOTE — Assessment & Plan Note (Signed)
Only received on B12 injection in January 2021, also not taking oral pills. Repeat B12 lab pending.

## 2019-05-28 LAB — CBC
HCT: 37.7 % (ref 36.0–46.0)
Hemoglobin: 12.7 g/dL (ref 12.0–15.0)
MCHC: 33.6 g/dL (ref 30.0–36.0)
MCV: 97.5 fl (ref 78.0–100.0)
Platelets: 209 10*3/uL (ref 150.0–400.0)
RBC: 3.87 Mil/uL (ref 3.87–5.11)
RDW: 14.2 % (ref 11.5–15.5)
WBC: 7 10*3/uL (ref 4.0–10.5)

## 2019-05-28 LAB — COMPREHENSIVE METABOLIC PANEL
ALT: 9 U/L (ref 0–35)
AST: 13 U/L (ref 0–37)
Albumin: 4.4 g/dL (ref 3.5–5.2)
Alkaline Phosphatase: 71 U/L (ref 39–117)
BUN: 16 mg/dL (ref 6–23)
CO2: 26 mEq/L (ref 19–32)
Calcium: 10.8 mg/dL — ABNORMAL HIGH (ref 8.4–10.5)
Chloride: 109 mEq/L (ref 96–112)
Creatinine, Ser: 1.16 mg/dL (ref 0.40–1.20)
GFR: 55.11 mL/min — ABNORMAL LOW (ref >60.00)
Glucose, Bld: 110 mg/dL — ABNORMAL HIGH (ref 70–99)
Potassium: 4.4 mEq/L (ref 3.5–5.1)
Sodium: 140 mEq/L (ref 135–145)
Total Bilirubin: 1 mg/dL (ref 0.2–1.2)
Total Protein: 7.1 g/dL (ref 6.0–8.3)

## 2019-05-28 LAB — LIPID PANEL
Cholesterol: 197 mg/dL (ref 0–200)
HDL: 79.4 mg/dL (ref >39.00)
LDL Cholesterol: 92 mg/dL (ref 0–99)
NonHDL: 117.14
Total CHOL/HDL Ratio: 2
Triglycerides: 128 mg/dL (ref 0.0–149.0)
VLDL: 25.6 mg/dL (ref 0.0–40.0)

## 2019-05-28 LAB — URIC ACID: Uric Acid, Serum: 7.2 mg/dL — ABNORMAL HIGH (ref 2.4–7.0)

## 2019-05-29 LAB — VITAMIN B12: Vitamin B-12: 150 pg/mL — ABNORMAL LOW (ref 211–911)

## 2019-05-29 LAB — VITAMIN D 25 HYDROXY (VIT D DEFICIENCY, FRACTURES): VITD: 58.26 ng/mL (ref 30.00–100.00)

## 2019-05-31 ENCOUNTER — Other Ambulatory Visit: Payer: Self-pay | Admitting: Primary Care

## 2019-05-31 DIAGNOSIS — I10 Essential (primary) hypertension: Secondary | ICD-10-CM

## 2019-06-04 ENCOUNTER — Ambulatory Visit: Payer: Medicare Other

## 2019-06-09 ENCOUNTER — Other Ambulatory Visit: Payer: Self-pay | Admitting: Primary Care

## 2019-06-09 DIAGNOSIS — I10 Essential (primary) hypertension: Secondary | ICD-10-CM

## 2019-06-13 ENCOUNTER — Ambulatory Visit: Payer: Medicare Other

## 2019-06-20 ENCOUNTER — Other Ambulatory Visit: Payer: Self-pay

## 2019-06-20 ENCOUNTER — Ambulatory Visit
Admission: RE | Admit: 2019-06-20 | Discharge: 2019-06-20 | Disposition: A | Discharge status: Home / Self Care | Payer: Medicare Other | Source: Ambulatory Visit | Attending: Primary Care | Admitting: Primary Care

## 2019-06-20 DIAGNOSIS — R229 Localized swelling, mass and lump, unspecified: Secondary | ICD-10-CM | POA: Diagnosis not present

## 2019-09-13 ENCOUNTER — Other Ambulatory Visit: Payer: Self-pay | Admitting: Primary Care

## 2019-09-13 DIAGNOSIS — E790 Hyperuricemia without signs of inflammatory arthritis and tophaceous disease: Secondary | ICD-10-CM

## 2019-09-13 DIAGNOSIS — D510 Vitamin B12 deficiency anemia due to intrinsic factor deficiency: Secondary | ICD-10-CM

## 2019-09-17 ENCOUNTER — Other Ambulatory Visit (INDEPENDENT_AMBULATORY_CARE_PROVIDER_SITE_OTHER): Payer: Medicare Other

## 2019-09-17 ENCOUNTER — Other Ambulatory Visit: Payer: Medicare Other

## 2019-09-17 ENCOUNTER — Other Ambulatory Visit: Payer: Self-pay

## 2019-09-17 DIAGNOSIS — E790 Hyperuricemia without signs of inflammatory arthritis and tophaceous disease: Secondary | ICD-10-CM | POA: Diagnosis not present

## 2019-09-17 DIAGNOSIS — D510 Vitamin B12 deficiency anemia due to intrinsic factor deficiency: Secondary | ICD-10-CM | POA: Diagnosis not present

## 2019-09-17 LAB — CBC
HCT: 46.1 % — ABNORMAL HIGH (ref 36.0–46.0)
Hemoglobin: 15.5 g/dL — ABNORMAL HIGH (ref 12.0–15.0)
MCHC: 33.5 g/dL (ref 30.0–36.0)
MCV: 98.2 fl (ref 78.0–100.0)
Platelets: 202 10*3/uL (ref 150.0–400.0)
RBC: 4.69 Mil/uL (ref 3.87–5.11)
RDW: 15.3 % (ref 11.5–15.5)
WBC: 6.8 10*3/uL (ref 4.0–10.5)

## 2019-09-17 LAB — VITAMIN B12: Vitamin B-12: 1173 pg/mL — ABNORMAL HIGH (ref 211–911)

## 2019-09-17 LAB — URIC ACID: Uric Acid, Serum: 10.6 mg/dL — ABNORMAL HIGH (ref 2.4–7.0)

## 2019-09-19 ENCOUNTER — Telehealth: Payer: Self-pay

## 2019-09-19 DIAGNOSIS — E790 Hyperuricemia without signs of inflammatory arthritis and tophaceous disease: Secondary | ICD-10-CM

## 2019-09-19 NOTE — Telephone Encounter (Signed)
Patient takes oral B12, advised her to start taking three times weekly.  Right leg are sore and it is hard to walk.  She is willing to start allopurinol.  Pharmacy listed is correct. She is still seeing Dr Carmela Rima has a fall appointment.

## 2019-09-19 NOTE — Telephone Encounter (Signed)
Thank you.  Correction, and my apologies, if she is having active gout symptoms with this high uric acid then we need to initiate treatment to reduce her symptoms.  Start prednisone tablets. Take two tablets for four days, then one tablet for four days.  We to repeat uric acid level in 1 month, can she have this done in our office?

## 2019-09-19 NOTE — Telephone Encounter (Signed)
-----   Message from Pleas Koch, NP sent at 09/19/2019 11:59 AM EDT ----- Please notify patient:  Vitamin B12 is too high. Is she taking oral B12 or is she getting injections? If oral and taking daily, then take three times weekly.  Gout levels are too high. Is she having any gout flares? Joint aches? If so, would she be willing to start gout medication called allopurinol? If so, then needs repeat lab in one month.  Is she still seeing Dr. Carmela Rima?

## 2019-09-20 MED ORDER — PREDNISONE 20 MG PO TABS: ORAL_TABLET | ORAL | 0 refills | Status: DC

## 2019-09-20 NOTE — Telephone Encounter (Signed)
Patient aware of lab results.  She is willing to do the prednisone treatment.

## 2019-09-20 NOTE — Telephone Encounter (Signed)
Noted, prescription for prednisone course sent to pharmacy. If she willing to have her labs done (see below)?  Needs repeat uric acid in 1 month.

## 2019-09-20 NOTE — Telephone Encounter (Signed)
Informed patient prednisone rx was sent to pharmacy and lab appointment scheduled.

## 2019-09-30 ENCOUNTER — Telehealth: Payer: Self-pay | Admitting: Primary Care

## 2019-09-30 DIAGNOSIS — E790 Hyperuricemia without signs of inflammatory arthritis and tophaceous disease: Secondary | ICD-10-CM

## 2019-09-30 NOTE — Telephone Encounter (Signed)
No answer no vm

## 2019-09-30 NOTE — Telephone Encounter (Signed)
Usually done as a taper and not a maintenance med. Is she still having issues with gout?

## 2019-09-30 NOTE — Telephone Encounter (Signed)
Pt called and needs a refill on prednisone 20 mg.  She says she has been out for a few days, but not sure if you wanted her to continue on it or not. [facility] on [address]. Please advise.  Pt requests c/b [phone]

## 2019-10-01 NOTE — Telephone Encounter (Addendum)
Refills for prednisone are not appropriate without follow-up. She needs a repeat uric acid level first, please set up a lab appointment.

## 2019-10-01 NOTE — Addendum Note (Signed)
Addended by: Pleas Koch on: 10/01/2019 04:26 PM   Modules accepted: Orders

## 2019-10-01 NOTE — Telephone Encounter (Addendum)
Pt called back, her foot is better but still tender, it she hits it aginst anything or touches it it's painful, pt thinks another round of prednisone will help completely clear up her sxs. Pt didn't think she was on med long enough

## 2019-10-01 NOTE — Telephone Encounter (Signed)
Pt returned your call  Best number [phone]

## 2019-10-01 NOTE — Telephone Encounter (Signed)
Not able to leave vm

## 2019-10-02 ENCOUNTER — Other Ambulatory Visit: Payer: Self-pay

## 2019-10-02 DIAGNOSIS — M109 Gout, unspecified: Secondary | ICD-10-CM

## 2019-10-02 NOTE — Telephone Encounter (Signed)
FYI called patient gave information. She declined appointment at this time has family coming in and will call our office next week to schedule lab. I have placed order for future.

## 2019-10-17 ENCOUNTER — Other Ambulatory Visit (INDEPENDENT_AMBULATORY_CARE_PROVIDER_SITE_OTHER): Payer: Medicare Other

## 2019-10-17 ENCOUNTER — Other Ambulatory Visit: Payer: Self-pay

## 2019-10-17 DIAGNOSIS — M109 Gout, unspecified: Secondary | ICD-10-CM | POA: Diagnosis not present

## 2019-10-17 LAB — URIC ACID: Uric Acid, Serum: 11.9 mg/dL — ABNORMAL HIGH (ref 2.4–7.0)

## 2019-10-21 ENCOUNTER — Other Ambulatory Visit: Payer: Self-pay | Admitting: Primary Care

## 2019-10-21 DIAGNOSIS — M109 Gout, unspecified: Secondary | ICD-10-CM

## 2019-10-21 DIAGNOSIS — I10 Essential (primary) hypertension: Secondary | ICD-10-CM

## 2019-10-21 MED ORDER — PREDNISONE 20 MG PO TABS: ORAL_TABLET | ORAL | 0 refills | Status: DC

## 2019-10-28 ENCOUNTER — Other Ambulatory Visit: Payer: Self-pay

## 2019-10-28 DIAGNOSIS — M109 Gout, unspecified: Secondary | ICD-10-CM

## 2019-11-01 ENCOUNTER — Other Ambulatory Visit (INDEPENDENT_AMBULATORY_CARE_PROVIDER_SITE_OTHER): Payer: Medicare Other

## 2019-11-01 ENCOUNTER — Other Ambulatory Visit: Payer: Self-pay

## 2019-11-01 DIAGNOSIS — M109 Gout, unspecified: Secondary | ICD-10-CM | POA: Diagnosis not present

## 2019-11-01 LAB — URIC ACID: Uric Acid, Serum: 9.8 mg/dL — ABNORMAL HIGH (ref 2.4–7.0)

## 2019-11-04 ENCOUNTER — Other Ambulatory Visit: Payer: Self-pay | Admitting: Primary Care

## 2019-11-04 ENCOUNTER — Other Ambulatory Visit: Payer: Self-pay

## 2019-11-04 DIAGNOSIS — M109 Gout, unspecified: Secondary | ICD-10-CM

## 2019-11-04 MED ORDER — ALLOPURINOL 100 MG PO TABS: 50.0000 mg | ORAL_TABLET | Freq: Every day | ORAL | 0 refills | Status: DC

## 2019-11-04 MED ORDER — ALLOPURINOL 100 MG PO TABS: 100.0000 mg | ORAL_TABLET | Freq: Every day | ORAL | 0 refills | Status: DC

## 2019-11-25 ENCOUNTER — Other Ambulatory Visit: Payer: Self-pay

## 2019-11-25 ENCOUNTER — Other Ambulatory Visit: Payer: Self-pay | Admitting: Primary Care

## 2019-11-25 ENCOUNTER — Ambulatory Visit (INDEPENDENT_AMBULATORY_CARE_PROVIDER_SITE_OTHER): Payer: Medicare Other | Admitting: Primary Care

## 2019-11-25 VITALS — BP 132/88 | HR 98 | Temp 97.9°F | Resp 16 | Ht 63.0 in | Wt 191.4 lb

## 2019-11-25 DIAGNOSIS — M810 Age-related osteoporosis without current pathological fracture: Secondary | ICD-10-CM | POA: Diagnosis not present

## 2019-11-25 DIAGNOSIS — I10 Essential (primary) hypertension: Secondary | ICD-10-CM | POA: Diagnosis not present

## 2019-11-25 DIAGNOSIS — N1831 Chronic kidney disease, stage 3a: Secondary | ICD-10-CM | POA: Diagnosis not present

## 2019-11-25 DIAGNOSIS — M109 Gout, unspecified: Secondary | ICD-10-CM

## 2019-11-25 DIAGNOSIS — E785 Hyperlipidemia, unspecified: Secondary | ICD-10-CM

## 2019-11-25 DIAGNOSIS — N184 Chronic kidney disease, stage 4 (severe): Secondary | ICD-10-CM

## 2019-11-25 LAB — COMPREHENSIVE METABOLIC PANEL
ALT: 8 U/L (ref 0–35)
AST: 11 U/L (ref 0–37)
Albumin: 4.1 g/dL (ref 3.5–5.2)
Alkaline Phosphatase: 56 U/L (ref 39–117)
BUN: 19 mg/dL (ref 6–23)
CO2: 27 mEq/L (ref 19–32)
Calcium: 11.5 mg/dL — ABNORMAL HIGH (ref 8.4–10.5)
Chloride: 106 mEq/L (ref 96–112)
Creatinine, Ser: 1.97 mg/dL — ABNORMAL HIGH (ref 0.40–1.20)
GFR: 24.43 mL/min — ABNORMAL LOW (ref >60.00)
Glucose, Bld: 115 mg/dL — ABNORMAL HIGH (ref 70–99)
Potassium: 3.8 mEq/L (ref 3.5–5.1)
Sodium: 141 mEq/L (ref 135–145)
Total Bilirubin: 1.3 mg/dL — ABNORMAL HIGH (ref 0.2–1.2)
Total Protein: 7.1 g/dL (ref 6.0–8.3)

## 2019-11-25 LAB — URIC ACID: Uric Acid, Serum: 7.1 mg/dL — ABNORMAL HIGH (ref 2.4–7.0)

## 2019-11-25 MED ORDER — VITAMIN D (ERGOCALCIFEROL) 1.25 MG (50000 UNIT) PO CAPS: 50000.0000 [IU] | ORAL_CAPSULE | ORAL | 0 refills | Status: DC

## 2019-11-25 NOTE — Assessment & Plan Note (Signed)
Improved and under better control in the office today.  No longer on hydrochlorothiazide.  Continue current regimen of amlodipine 10 mg daily, irbesartan 300 mg daily, metoprolol succinate 25 mg daily, furosemide 20 mg daily.  Repeat BMP pending.

## 2019-11-25 NOTE — Assessment & Plan Note (Signed)
Unclear why endocrinology felt the need to remove her from rosuvastatin, especially given ASCVD risk score of 32.8% and her history of hypertension and tobacco abuse.  Resume rosuvastatin 20 mg daily.

## 2019-11-25 NOTE — Progress Notes (Signed)
Subjective:    Patient ID: Danielle Zamora, female    DOB: 12/30/1945, 74 y.o.   MRN: [id number]  HPI  This visit occurred during the SARS-CoV-2 public health emergency.  Safety protocols were in place, including screening questions prior to the visit, additional usage of staff PPE, and extensive cleaning of exam room while observing appropriate contact time as indicated for disinfecting solutions.   Danielle Zamora is a 74 year old female with a history of hypertension, hyperparathyroidism, CKD, osteoporosis, pernicious anemia, vitamin D deficiency, prediabetes who presents today for follow up.  1) Recurrent Gout: Originally detected in May 2021 in our office with Uric Acid level of 7.2. Repeat levels in September 2021 with a reading of 10.6. Repeat uric acid level in early October 2021 with level of 11.9 and with symptoms of joint pain. We prescribed a course of prednisone for symptoms at that time and repeated Uric Acid in late October with reading of 9.8. Symptoms had resolved so we initiated allopurinol 50 mg daily for prevention.   Today she endorses reduction in pain to the left great toe. She denies erythema and swelling. She is compliant to her allopurinol 50 mg daily. She has a family history of gout.  She was managed on hydrochlorothiazide which was switched to furosemide per endocrinology.  2) CKD: Currently following with endocrinology, last visit being last week. During this visit she was told that her kidney function has declined. She has a family history of CKD and her mother was on hemodialysis.  She was told by the endocrinologist that she may have to go on dialysis given recent kidney function.  We do not have those records.  She admits to drinking very little water daily.  3) Hyperlipidemia: Prescribed rosuvastatin (Crestor) 20 mg. She stopped taking this one week ago as she was told by her endocrinologist that she "didn't need it".  She has a long history of tobacco abuse and  hypertension.  The 10-year ASCVD risk score Mikey Bussing DC Brooke Bonito., et al., 2013) is: 32.8%   Values used to calculate the score:     Age: 67 years     Sex: Female     Is Non-Hispanic African American: Yes     Diabetic: No     Tobacco smoker: Yes     Systolic Blood Pressure: 789 mmHg     Is BP treated: Yes     HDL Cholesterol: 79.4 mg/dL     Total Cholesterol: 197 mg/dL   4) Essential Hypertension: Currently managed on amlodipine 10 mg, furosemide 20 mg, metoprolol succinate XL 25 mg, irbesartan 300 mg. Her endocrinologist recently adjusted her regimen, added furosemide and discontinue hydrochlorothiazide.  BP Readings from Last 3 Encounters:  11/25/19 132/88  05/27/19 (!) 150/92  01/21/19 (!) 148/98     Review of Systems  Constitutional: Negative for fever.  Respiratory: Negative for shortness of breath.   Cardiovascular: Negative for chest pain.  Musculoskeletal: Negative for arthralgias and joint swelling.  Skin: Negative for color change.       Past Medical History:  Diagnosis Date  . Essential hypertension   . Pernicious anemia      Social History   Socioeconomic History  . Marital status: Widowed    Spouse name: Not on file  . Number of children: Not on file  . Years of education: Not on file  . Highest education level: Not on file  Occupational History  . Not on file  Tobacco Use  . Smoking status:  Current Every Day Smoker    Packs/day: 0.75    Years: 56.00    Pack years: 42.00    Types: Cigarettes  . Smokeless tobacco: Never Used  Vaping Use  . Vaping Use: Never used  Substance and Sexual Activity  . Alcohol use: Yes    Alcohol/week: 2.0 standard drinks    Types: 2 Shots of liquor per week    Comment: daily  . Drug use: Yes    Types: Marijuana    Comment: use it rarely  . Sexual activity: Not on file  Other Topics Concern  . Not on file  Social History Narrative   Widow.   3 children, 9 grandchildren   Retired.    Moved from [place].   Enjoys  spending time with family.   Social Determinants of Health   Financial Resource Strain: Low Risk   . Difficulty of Paying Living Expenses: Not hard at all  Food Insecurity: No Food Insecurity  . Worried About Charity fundraiser in the Last Year: Never true  . Ran Out of Food in the Last Year: Never true  Transportation Needs: No Transportation Needs  . Lack of Transportation (Medical): No  . Lack of Transportation (Non-Medical): No  Physical Activity: Inactive  . Days of Exercise per Week: 0 days  . Minutes of Exercise per Session: 0 min  Stress: No Stress Concern Present  . Feeling of Stress : Only a little  Social Connections:   . Frequency of Communication with Friends and Family: Not on file  . Frequency of Social Gatherings with Friends and Family: Not on file  . Attends Religious Services: Not on file  . Active Member of Clubs or Organizations: Not on file  . Attends Archivist Meetings: Not on file  . Marital Status: Not on file  Intimate Partner Violence: Not At Risk  . Fear of Current or Ex-Partner: No  . Emotionally Abused: No  . Physically Abused: No  . Sexually Abused: No    No past surgical history on file.  Family History  Problem Relation Age of Onset  . Kidney disease Mother   . Breast cancer Sister     Allergies  Allergen Reactions  . Penicillins Swelling    Has patient had a PCN reaction causing immediate rash, facial/tongue/throat swelling, SOB or lightheadedness with hypotension: Yes Has patient had a PCN reaction causing severe rash involving mucus membranes or skin necrosis: Yes Has patient had a PCN reaction that required hospitalization: No Has patient had a PCN reaction occurring within the last 10 years: No If all of the above answers are "NO", then may proceed with Cephalosporin use.     Current Outpatient Medications on File Prior to Visit  Medication Sig Dispense Refill  . albuterol (VENTOLIN HFA) 108 (90 Base) MCG/ACT  inhaler INHALE 2 PUFFS INTO THE LUNGS EVERY 6 HOURS AS NEEDED FOR SHORTNESS OF BREATH 54 g 0  . allopurinol (ZYLOPRIM) 100 MG tablet Take 0.5 tablets (50 mg total) by mouth daily. For gout prevention. 45 tablet 0  . amLODipine (NORVASC) 10 MG tablet TAKE 1 TABLET BY MOUTH EVERY DAY 90 tablet 1  . furosemide (LASIX) 20 MG tablet Take 20 mg by mouth daily.    . irbesartan (AVAPRO) 300 MG tablet TAKE 1 TABLET BY MOUTH EVERY DAY FOR BLOOD PRESSURE 90 tablet 1  . metoprolol succinate (TOPROL-XL) 25 MG 24 hr tablet TAKE 1 TABLET(25 MG) BY MOUTH DAILY FOR BLOOD PRESSURE 90 tablet  1  . rosuvastatin (CRESTOR) 20 MG tablet Take 1 tablet (20 mg total) by mouth every evening. For cholesterol. 90 tablet 3   No current facility-administered medications on file prior to visit.    BP 132/88   Pulse 98   Temp 97.9 F (36.6 C) (Temporal)   Resp 16   Ht 5\' 3"  (1.6 m)   Wt 191 lb 6.4 oz (86.8 kg)   SpO2 98%   BMI 33.90 kg/m    Objective:   Physical Exam Cardiovascular:     Rate and Rhythm: Normal rate and regular rhythm.     Pulses:          Dorsalis pedis pulses are 2+ on the right side and 1+ on the left side.       Posterior tibial pulses are 2+ on the right side and 1+ on the left side.  Pulmonary:     Effort: Pulmonary effort is normal.     Breath sounds: Normal breath sounds.  Musculoskeletal:        General: No swelling or tenderness.     Cervical back: Neck supple.     Comments: Left great toe and foot without erythema or swelling.  Skin:    General: Skin is warm and dry.     Findings: No erythema.  Neurological:     Mental Status: She is alert.  Psychiatric:        Mood and Affect: Mood normal.            Assessment & Plan:

## 2019-11-25 NOTE — Assessment & Plan Note (Signed)
Compliant to allopurinol 50 mg, symptoms seem to have resolved.  Exam today unremarkable.  Repeat uric acid level pending.

## 2019-11-25 NOTE — Assessment & Plan Note (Signed)
Patient endorses that she was told she may need hemodialysis, BMP from May 2021 stable.  Repeat BMP pending today.

## 2019-11-25 NOTE — Assessment & Plan Note (Signed)
No longer on Reclast per patient. Follows with endocrinology and managed on a treatment for which she cannot remember.  It seems like she may be on Prolia.

## 2019-11-25 NOTE — Patient Instructions (Signed)
Stop by the lab prior to leaving today. I will notify you of your results once received.   Resume taking rosuvastatin (Crestor) 20 mg daily for cholesterol and heart protection.   Continue taking the allopurinol (gout medication) 1/2 tablet everyday for gout.   It was a pleasure to see you today!

## 2019-11-26 ENCOUNTER — Telehealth: Payer: Self-pay | Admitting: Primary Care

## 2019-11-26 NOTE — Telephone Encounter (Signed)
Pt returned your call  Cell phone # [phone]

## 2019-11-26 NOTE — Telephone Encounter (Signed)
See lab note for information  °

## 2019-12-02 ENCOUNTER — Other Ambulatory Visit: Payer: Self-pay | Admitting: Primary Care

## 2019-12-02 DIAGNOSIS — I10 Essential (primary) hypertension: Secondary | ICD-10-CM

## 2019-12-09 ENCOUNTER — Other Ambulatory Visit: Payer: Self-pay | Admitting: Primary Care

## 2019-12-09 DIAGNOSIS — I10 Essential (primary) hypertension: Secondary | ICD-10-CM

## 2019-12-10 NOTE — Telephone Encounter (Signed)
Pharmacy requests refill on: Irbesartan 300 mg   LAST REFILL: 06/12/2019 (Q-90, R-1) LAST OV: 11/25/2019 NEXT OV: Not Scheduled  PHARMACY: [facility] [place], [place]

## 2020-01-23 ENCOUNTER — Other Ambulatory Visit: Payer: Self-pay | Admitting: Nephrology

## 2020-01-23 DIAGNOSIS — N1832 Chronic kidney disease, stage 3b: Secondary | ICD-10-CM

## 2020-01-23 DIAGNOSIS — R8290 Unspecified abnormal findings in urine: Secondary | ICD-10-CM

## 2020-01-30 ENCOUNTER — Ambulatory Visit: Payer: Medicare Other

## 2020-02-04 ENCOUNTER — Ambulatory Visit
Admission: RE | Admit: 2020-02-04 | Discharge: 2020-02-04 | Disposition: A | Discharge status: Home / Self Care | Payer: Medicare Other | Source: Ambulatory Visit | Attending: Nephrology | Admitting: Nephrology

## 2020-02-04 ENCOUNTER — Other Ambulatory Visit: Payer: Self-pay

## 2020-02-04 DIAGNOSIS — R8290 Unspecified abnormal findings in urine: Secondary | ICD-10-CM | POA: Diagnosis present

## 2020-02-04 DIAGNOSIS — N1832 Chronic kidney disease, stage 3b: Secondary | ICD-10-CM | POA: Diagnosis present

## 2020-02-05 ENCOUNTER — Other Ambulatory Visit: Payer: Self-pay | Admitting: Primary Care

## 2020-02-05 DIAGNOSIS — M109 Gout, unspecified: Secondary | ICD-10-CM

## 2020-02-05 NOTE — Telephone Encounter (Signed)
Pharmacy requests refill on: Allopurinol 100 mg   LAST REFILL: 11/04/2019 (Q-45, R-0) LAST OV: 11/25/2019 NEXT OV: Not Scheduled  PHARMACY: [facility] [place], [place]

## 2020-02-06 ENCOUNTER — Telehealth: Payer: Self-pay | Admitting: Primary Care

## 2020-02-06 NOTE — Telephone Encounter (Signed)
LVM for pt to rtn my call to schedule awv with nha. 

## 2020-02-17 ENCOUNTER — Other Ambulatory Visit: Payer: Self-pay | Admitting: *Deleted

## 2020-02-17 ENCOUNTER — Telehealth: Payer: Self-pay | Admitting: *Deleted

## 2020-02-17 DIAGNOSIS — F17200 Nicotine dependence, unspecified, uncomplicated: Secondary | ICD-10-CM

## 2020-02-17 DIAGNOSIS — Z87891 Personal history of nicotine dependence: Secondary | ICD-10-CM

## 2020-02-17 DIAGNOSIS — Z122 Encounter for screening for malignant neoplasm of respiratory organs: Secondary | ICD-10-CM

## 2020-02-17 NOTE — Progress Notes (Signed)
Contacted and scheduled for annual lung screening scan. Patient is a current smoker with a 42.75 pack year history.

## 2020-02-17 NOTE — Telephone Encounter (Signed)
Attempted to contact and schedule lung screening scan. Message left for patient to call back to schedule. 

## 2020-02-25 ENCOUNTER — Ambulatory Visit (INDEPENDENT_AMBULATORY_CARE_PROVIDER_SITE_OTHER): Payer: Medicare Other | Admitting: Primary Care

## 2020-02-25 ENCOUNTER — Encounter: Payer: Self-pay | Admitting: Primary Care

## 2020-02-25 ENCOUNTER — Other Ambulatory Visit: Payer: Self-pay

## 2020-02-25 VITALS — BP 140/82 | HR 96 | Temp 97.6°F | Ht 63.0 in | Wt 192.0 lb

## 2020-02-25 DIAGNOSIS — M810 Age-related osteoporosis without current pathological fracture: Secondary | ICD-10-CM

## 2020-02-25 DIAGNOSIS — R7303 Prediabetes: Secondary | ICD-10-CM | POA: Diagnosis not present

## 2020-02-25 DIAGNOSIS — E559 Vitamin D deficiency, unspecified: Secondary | ICD-10-CM

## 2020-02-25 DIAGNOSIS — I10 Essential (primary) hypertension: Secondary | ICD-10-CM

## 2020-02-25 DIAGNOSIS — Z1231 Encounter for screening mammogram for malignant neoplasm of breast: Secondary | ICD-10-CM | POA: Diagnosis not present

## 2020-02-25 DIAGNOSIS — M109 Gout, unspecified: Secondary | ICD-10-CM | POA: Diagnosis not present

## 2020-02-25 DIAGNOSIS — R531 Weakness: Secondary | ICD-10-CM

## 2020-02-25 DIAGNOSIS — E785 Hyperlipidemia, unspecified: Secondary | ICD-10-CM | POA: Diagnosis not present

## 2020-02-25 DIAGNOSIS — Z720 Tobacco use: Secondary | ICD-10-CM

## 2020-02-25 DIAGNOSIS — D510 Vitamin B12 deficiency anemia due to intrinsic factor deficiency: Secondary | ICD-10-CM | POA: Diagnosis not present

## 2020-02-25 DIAGNOSIS — N184 Chronic kidney disease, stage 4 (severe): Secondary | ICD-10-CM

## 2020-02-25 DIAGNOSIS — E213 Hyperparathyroidism, unspecified: Secondary | ICD-10-CM

## 2020-02-25 NOTE — Assessment & Plan Note (Signed)
Compliant to rosuvastatin 20 mg, continue same. Repeat lipid panel pending.  

## 2020-02-25 NOTE — Assessment & Plan Note (Signed)
Bone density scan due now, she will complete this with her endocrinologist in May 2022 as they have imaging within the clinic.   Continue Prolia.

## 2020-02-25 NOTE — Progress Notes (Addendum)
Subjective:    Patient ID: Danielle Zamora, female    DOB: 10-Feb-1945, 75 y.o.   MRN: [id number]  HPI  This visit occurred during the SARS-CoV-2 public health emergency.  Safety protocols were in place, including screening questions prior to the visit, additional usage of staff PPE, and extensive cleaning of exam room while observing appropriate contact time as indicated for disinfecting solutions.   Danielle Zamora is a 75 year old female with a history of essential hypertension, hyperparathyroidism, osteoporosis, CKD, pernicious anemia, prediabetes, gout, hyperlipidemia, tobacco abuse who presents today for follow-up of chronic conditions and medication refills.  1) Chronic Gout: Currently managed on allopurinol 50 mg daily, last uric acid level of 7.1 in November 2021.  Last gout flare was around Christmas.   2) Essential Hypertension: Currently managed on irbesartan 300 mg, metoprolol succinate 25 mg, amlodipine 10 mg. She denies headaches, dizziness, chest pain.   She has noticed her stamina declining, has to sit and wash dishes, has difficultly taking care of her home. Her children live in different states and has no local help. She would like some information regarding help inside the home including meals, cleaning/upkeep in her home. She lives alone.  BP Readings from Last 3 Encounters:  02/25/20 140/82  11/25/19 132/88  05/27/19 (!) 150/92    3) Osteoporosis: Last bone is the scan in 2019, previously on Reclast per endocrinology, now on Prolia but she's not sure if she's had this yet. She is due for bone density scan, completes this in her endocrinologists office.   4) Pernicious Anemia: Last B12 level was 1173 per our labs, this was in September 2021. She is taking vitamin B12 1000 mcg once daily.   5) Hyperparathyroidism: Following with endocrinology, Dr. Francoise Zamora.  During her last visit in November 2021 her Reclast was stopped and replaced with Prolia.  Her hydrochlorothiazide was  discontinued and replaced with furosemide 20 mg daily.    6) CKD: Currently following with nephrology. She is now managed on Calcitrol 0.5 mcg. She was told that one of her kidneys were smaller than the other. She has an appointment scheduled for next month.  Review of Systems  Respiratory: Negative for shortness of breath.   Cardiovascular: Negative for chest pain.  Gastrointestinal: Negative for constipation and diarrhea.  Musculoskeletal: Negative for arthralgias.  Neurological: Negative for dizziness and headaches.       Past Medical History:  Diagnosis Date  . Essential hypertension   . Pernicious anemia      Social History   Socioeconomic History  . Marital status: Widowed    Spouse name: Not on file  . Number of children: Not on file  . Years of education: Not on file  . Highest education level: Not on file  Occupational History  . Not on file  Tobacco Use  . Smoking status: Current Every Day Smoker    Packs/day: 0.75    Years: 56.00    Pack years: 42.00    Types: Cigarettes  . Smokeless tobacco: Never Used  Vaping Use  . Vaping Use: Never used  Substance and Sexual Activity  . Alcohol use: Yes    Alcohol/week: 2.0 standard drinks    Types: 2 Shots of liquor per week    Comment: daily  . Drug use: Yes    Types: Marijuana    Comment: use it rarely  . Sexual activity: Not on file  Other Topics Concern  . Not on file  Social History Narrative  Widow.   3 children, 9 grandchildren   Retired.    Moved from [place].   Enjoys spending time with family.   Social Determinants of Health   Financial Resource Strain: Not on file  Food Insecurity: Not on file  Transportation Needs: Not on file  Physical Activity: Not on file  Stress: Not on file  Social Connections: Not on file  Intimate Partner Violence: Not on file    History reviewed. No pertinent surgical history.  Family History  Problem Relation Age of Onset  . Kidney disease Mother   . Breast  cancer Sister     Allergies  Allergen Reactions  . Penicillins Swelling    Has patient had a PCN reaction causing immediate rash, facial/tongue/throat swelling, SOB or lightheadedness with hypotension: Yes Has patient had a PCN reaction causing severe rash involving mucus membranes or skin necrosis: Yes Has patient had a PCN reaction that required hospitalization: No Has patient had a PCN reaction occurring within the last 10 years: No If all of the above answers are "NO", then may proceed with Cephalosporin use.     Current Outpatient Medications on File Prior to Visit  Medication Sig Dispense Refill  . albuterol (VENTOLIN HFA) 108 (90 Base) MCG/ACT inhaler INHALE 2 PUFFS INTO THE LUNGS EVERY 6 HOURS AS NEEDED FOR SHORTNESS OF BREATH 54 g 0  . allopurinol (ZYLOPRIM) 100 MG tablet TAKE 1/2 TABLET(50 MG) BY MOUTH DAILY FOR GOUT PREVENTION 45 tablet 0  . amLODipine (NORVASC) 10 MG tablet TAKE 1 TABLET BY MOUTH EVERY DAY 90 tablet 1  . furosemide (LASIX) 20 MG tablet Take 20 mg by mouth daily.    . irbesartan (AVAPRO) 300 MG tablet TAKE 1 TABLET BY MOUTH EVERY DAY FOR BLOOD PRESSURE 90 tablet 1  . metoprolol succinate (TOPROL-XL) 25 MG 24 hr tablet TAKE 1 TABLET(25 MG) BY MOUTH DAILY FOR BLOOD PRESSURE 90 tablet 1  . rosuvastatin (CRESTOR) 20 MG tablet Take 1 tablet (20 mg total) by mouth every evening. For cholesterol. 90 tablet 3  . calcitRIOL (ROCALTROL) 0.5 MCG capsule Take 0.5 mcg by mouth daily. (Patient not taking: Reported on 02/25/2020)     No current facility-administered medications on file prior to visit.    BP 140/82   Pulse 96   Temp 97.6 F (36.4 C) (Temporal)   Ht 5\' 3"  (1.6 m)   Wt 192 lb (87.1 kg)   SpO2 100%   BMI 34.01 kg/m    Objective:   Physical Exam Constitutional:      Appearance: She is well-nourished.  Cardiovascular:     Rate and Rhythm: Normal rate and regular rhythm.  Pulmonary:     Effort: Pulmonary effort is normal.     Breath sounds: Normal  breath sounds.  Musculoskeletal:     Cervical back: Neck supple.  Skin:    General: Skin is warm and dry.  Psychiatric:        Mood and Affect: Mood and affect and mood normal.            Assessment & Plan:

## 2020-02-25 NOTE — Assessment & Plan Note (Signed)
Compliant to vitamin B12 1000 mcg daily. Repeat B12 level pending.

## 2020-02-25 NOTE — Assessment & Plan Note (Signed)
Continues to smoke, not wanting to quit. Lung cancer screening CT scan due tomorrow.

## 2020-02-25 NOTE — Assessment & Plan Note (Signed)
One flare over Christmas, no flares since. Compliant to allopurinol 50 mg daily. Repeat uric acid levels pending.  Continue allopurinol 50 mg.

## 2020-02-25 NOTE — Assessment & Plan Note (Signed)
Repeat A1C pending. 

## 2020-02-25 NOTE — Assessment & Plan Note (Signed)
Following with endocrinology 

## 2020-02-25 NOTE — Assessment & Plan Note (Addendum)
Stable in the office today given her age. Continue irbesartan 300 mg daily, furosemide 20 mg daily, metoprolol succinate 25 mg daily, amlodipine 10 mg daily.  Labs pending.  I do see an overall decline since last visit, [facility] referral placed for help inside the home. She also provides [place] with permission to speak with her family, she will sign DPR.  Update: [facility] is not within her network. Spoke with both she and her son, they agree to home health physical therapy/nursing/social work consult. Referral was placed. Family was updated regarding patient's condition.

## 2020-02-25 NOTE — Patient Instructions (Addendum)
Stop by the lab prior to leaving today. I will notify you of your results once received.   You are due for your bone density scan, please have this done when you see the endocrinologist.   Call the [facility] to schedule your mammogram.   Complete lung cancer screening as scheduled.   It was a pleasure to see you today!     Call to make app  []   2D Mammogram  [x]   3D Mammogram  []   Bone Density      Your appointment will at the following location  [x]   [facility]  [place] [address]  [phone]  []   [place] at [facility])   [address] Room [place], [place] [address]  [phone]  []   [facility]      [address] [place], [place]         []   [place], [place]  []  [facility]   [address], [place] [address]  []  [place]  [address], [place] [address] [phone]    Make sure to wear two peace clothing  No lotions powders or deodorants the day of the appointment Make sure to bring picture ID and insurance card.  Bring list of medications you are currently taking including any supplements.

## 2020-02-25 NOTE — Assessment & Plan Note (Signed)
Now following with nephrology, due for follow up visit in 1 month.

## 2020-02-25 NOTE — Assessment & Plan Note (Signed)
Repeat Vitamin D pending, does not seem to be taking the 50,000 IU capsule weekly.

## 2020-02-26 ENCOUNTER — Telehealth: Payer: Self-pay

## 2020-02-26 ENCOUNTER — Ambulatory Visit
Admission: RE | Admit: 2020-02-26 | Discharge: 2020-02-26 | Disposition: A | Discharge status: Home / Self Care | Payer: Medicare Other | Source: Ambulatory Visit | Attending: Oncology | Admitting: Oncology

## 2020-02-26 DIAGNOSIS — F17200 Nicotine dependence, unspecified, uncomplicated: Secondary | ICD-10-CM | POA: Diagnosis present

## 2020-02-26 DIAGNOSIS — Z122 Encounter for screening for malignant neoplasm of respiratory organs: Secondary | ICD-10-CM | POA: Diagnosis present

## 2020-02-26 DIAGNOSIS — Z87891 Personal history of nicotine dependence: Secondary | ICD-10-CM | POA: Insufficient documentation

## 2020-02-26 LAB — COMPREHENSIVE METABOLIC PANEL
ALT: 9 U/L (ref 0–35)
AST: 13 U/L (ref 0–37)
Albumin: 4.2 g/dL (ref 3.5–5.2)
Alkaline Phosphatase: 58 U/L (ref 39–117)
BUN: 15 mg/dL (ref 6–23)
CO2: 26 mEq/L (ref 19–32)
Calcium: 11.1 mg/dL — ABNORMAL HIGH (ref 8.4–10.5)
Chloride: 106 mEq/L (ref 96–112)
Creatinine, Ser: 1.59 mg/dL — ABNORMAL HIGH (ref 0.40–1.20)
GFR: 31.54 mL/min — ABNORMAL LOW (ref >60.00)
Glucose, Bld: 116 mg/dL — ABNORMAL HIGH (ref 70–99)
Potassium: 4.1 mEq/L (ref 3.5–5.1)
Sodium: 143 mEq/L (ref 135–145)
Total Bilirubin: 1.2 mg/dL (ref 0.2–1.2)
Total Protein: 6.8 g/dL (ref 6.0–8.3)

## 2020-02-26 LAB — CBC
HCT: 37.3 % (ref 36.0–46.0)
Hemoglobin: 12.4 g/dL (ref 12.0–15.0)
MCHC: 33.3 g/dL (ref 30.0–36.0)
MCV: 94 fl (ref 78.0–100.0)
Platelets: 167 10*3/uL (ref 150.0–400.0)
RBC: 3.96 Mil/uL (ref 3.87–5.11)
RDW: 15.3 % (ref 11.5–15.5)
WBC: 5.5 10*3/uL (ref 4.0–10.5)

## 2020-02-26 LAB — LIPID PANEL
Cholesterol: 201 mg/dL — ABNORMAL HIGH (ref 0–200)
HDL: 77.7 mg/dL (ref >39.00)
LDL Cholesterol: 102 mg/dL — ABNORMAL HIGH (ref 0–99)
NonHDL: 123.04
Total CHOL/HDL Ratio: 3
Triglycerides: 104 mg/dL (ref 0.0–149.0)
VLDL: 20.8 mg/dL (ref 0.0–40.0)

## 2020-02-26 LAB — VITAMIN D 25 HYDROXY (VIT D DEFICIENCY, FRACTURES): VITD: 112.31 ng/mL (ref 30.00–100.00)

## 2020-02-26 LAB — VITAMIN B12: Vitamin B-12: 407 pg/mL (ref 211–911)

## 2020-02-26 LAB — HEMOGLOBIN A1C: Hgb A1c MFr Bld: 5.7 % (ref 4.6–6.5)

## 2020-02-26 LAB — TSH: TSH: 1.66 u[IU]/mL (ref 0.35–4.50)

## 2020-02-26 LAB — URIC ACID: Uric Acid, Serum: 6 mg/dL (ref 2.4–7.0)

## 2020-02-26 NOTE — Telephone Encounter (Signed)
[facility] called critical results @ 1125  Vit D 112.31

## 2020-02-26 NOTE — Telephone Encounter (Signed)
Called patient she was on 50,000 weekly but was taken off on 2/10. She is not taking any otc at this time.

## 2020-02-26 NOTE — Addendum Note (Signed)
Addended by: Pleas Koch on: 02/26/2020 06:42 PM   Modules accepted: Orders

## 2020-02-26 NOTE — Telephone Encounter (Signed)
Noted and agree to hold off vitamin D for this time.

## 2020-02-26 NOTE — Telephone Encounter (Signed)
Noted. How much vitamin D is she taking?

## 2020-02-28 ENCOUNTER — Encounter: Payer: Self-pay | Admitting: *Deleted

## 2020-03-05 ENCOUNTER — Telehealth: Payer: Self-pay | Admitting: Primary Care

## 2020-03-05 NOTE — Telephone Encounter (Signed)
Pt called in wanted to know about getting verbal orders for PT 2w1 1w2 1w every 4 weeks bedside comode

## 2020-03-09 NOTE — Telephone Encounter (Signed)
Ok for PT 

## 2020-03-10 NOTE — Telephone Encounter (Signed)
Approved.  

## 2020-03-10 NOTE — Telephone Encounter (Signed)
Called and gave verbal orders per Allie Bossier, NP.

## 2020-03-11 ENCOUNTER — Telehealth: Payer: Self-pay | Admitting: *Deleted

## 2020-03-11 DIAGNOSIS — D510 Vitamin B12 deficiency anemia due to intrinsic factor deficiency: Secondary | ICD-10-CM

## 2020-03-11 DIAGNOSIS — Z9181 History of falling: Secondary | ICD-10-CM

## 2020-03-11 DIAGNOSIS — N184 Chronic kidney disease, stage 4 (severe): Secondary | ICD-10-CM

## 2020-03-11 DIAGNOSIS — I129 Hypertensive chronic kidney disease with stage 1 through stage 4 chronic kidney disease, or unspecified chronic kidney disease: Secondary | ICD-10-CM

## 2020-03-11 DIAGNOSIS — M1A9XX0 Chronic gout, unspecified, without tophus (tophi): Secondary | ICD-10-CM

## 2020-03-11 DIAGNOSIS — E785 Hyperlipidemia, unspecified: Secondary | ICD-10-CM

## 2020-03-11 DIAGNOSIS — R7303 Prediabetes: Secondary | ICD-10-CM

## 2020-03-11 DIAGNOSIS — M810 Age-related osteoporosis without current pathological fracture: Secondary | ICD-10-CM

## 2020-03-11 DIAGNOSIS — F17210 Nicotine dependence, cigarettes, uncomplicated: Secondary | ICD-10-CM

## 2020-03-11 DIAGNOSIS — E213 Hyperparathyroidism, unspecified: Secondary | ICD-10-CM

## 2020-03-11 NOTE — Telephone Encounter (Signed)
Approved orders. No, I do not have any information regarding memory or dementia.

## 2020-03-11 NOTE — Telephone Encounter (Signed)
Minda speech therapist with [facility] left a voicemail stating that she did an evaluation on the patient and would like verbal orders for once a week for 4 weeks. Alla Feeling stated that she did a sloms memory test on patient today and the patient got 25 points out of 30.  Alla Feeling stated that she does not show any diagnosis of mild cognitive deficit. Alla Feeling stated that she would like to see if there is anything on her diagnosis related to cognition or memory.

## 2020-03-13 NOTE — Telephone Encounter (Signed)
Called spoke to Victor gave verbal per [place]. Informed that we do not have any information regarding memory or dementia. She is concerned that it may not be covered with out any documentation from provider. Let her know we are more than happy to have patient come in office for evaluation if needed just have her call office to set that up.

## 2020-03-24 ENCOUNTER — Telehealth: Payer: Self-pay

## 2020-03-24 NOTE — Telephone Encounter (Signed)
Noted and okay with me.

## 2020-03-24 NOTE — Telephone Encounter (Signed)
Tillie Rung PT (did not leave [facility] agency) left v/nm that pt requested to move PT visit from this wk to next wk. If that is OK with Gentry Fitz NP no cb needed.

## 2020-04-20 ENCOUNTER — Other Ambulatory Visit: Payer: Self-pay | Admitting: Primary Care

## 2020-04-20 DIAGNOSIS — I10 Essential (primary) hypertension: Secondary | ICD-10-CM

## 2020-04-22 ENCOUNTER — Other Ambulatory Visit: Payer: Self-pay | Admitting: Primary Care

## 2020-04-22 DIAGNOSIS — E785 Hyperlipidemia, unspecified: Secondary | ICD-10-CM

## 2020-04-22 NOTE — Telephone Encounter (Signed)
Refills sent to pharmacy. 

## 2020-04-22 NOTE — Telephone Encounter (Signed)
Refill request Crestor Last refill 03/26/19 #90/3 Last office visit 02/25/20 Last lipids 02/25/20 No upcoming appointment scheduled

## 2020-05-08 ENCOUNTER — Other Ambulatory Visit: Payer: Self-pay | Admitting: Primary Care

## 2020-05-08 DIAGNOSIS — M109 Gout, unspecified: Secondary | ICD-10-CM

## 2020-06-04 ENCOUNTER — Other Ambulatory Visit: Payer: Self-pay | Admitting: Primary Care

## 2020-06-04 DIAGNOSIS — I10 Essential (primary) hypertension: Secondary | ICD-10-CM

## 2020-06-15 ENCOUNTER — Other Ambulatory Visit: Payer: Self-pay | Admitting: Primary Care

## 2020-06-15 DIAGNOSIS — I10 Essential (primary) hypertension: Secondary | ICD-10-CM

## 2020-06-25 DIAGNOSIS — R8290 Unspecified abnormal findings in urine: Secondary | ICD-10-CM | POA: Insufficient documentation

## 2020-06-25 DIAGNOSIS — N2581 Secondary hyperparathyroidism of renal origin: Secondary | ICD-10-CM

## 2020-06-25 DIAGNOSIS — N1832 Chronic kidney disease, stage 3b: Secondary | ICD-10-CM | POA: Insufficient documentation

## 2020-06-25 DIAGNOSIS — N261 Atrophy of kidney (terminal): Secondary | ICD-10-CM | POA: Insufficient documentation

## 2020-06-25 HISTORY — DX: Secondary hyperparathyroidism of renal origin: N25.81

## 2020-08-31 ENCOUNTER — Telehealth: Payer: Self-pay | Admitting: Primary Care

## 2020-08-31 NOTE — Telephone Encounter (Signed)
Pt came into office to drop off license plate application. Placed in provider box

## 2020-09-01 NOTE — Telephone Encounter (Signed)
Form placed in your box for review.  

## 2020-09-01 NOTE — Telephone Encounter (Signed)
Completed and placed in Joellen's inbox. 

## 2020-09-02 NOTE — Telephone Encounter (Signed)
Called patient let know at reception to pick up. Copy sent to scan for pt chart.

## 2020-12-05 ENCOUNTER — Other Ambulatory Visit: Payer: Self-pay | Admitting: Primary Care

## 2020-12-05 DIAGNOSIS — I10 Essential (primary) hypertension: Secondary | ICD-10-CM

## 2021-01-19 ENCOUNTER — Ambulatory Visit: Payer: Medicare Other | Admitting: Primary Care

## 2021-02-24 DIAGNOSIS — I700 Atherosclerosis of aorta: Secondary | ICD-10-CM | POA: Insufficient documentation

## 2021-03-01 ENCOUNTER — Ambulatory Visit: Payer: Medicare Other | Admitting: Family Medicine

## 2021-03-16 ENCOUNTER — Other Ambulatory Visit: Payer: Self-pay | Admitting: Primary Care

## 2021-03-16 DIAGNOSIS — I10 Essential (primary) hypertension: Secondary | ICD-10-CM

## 2021-03-16 NOTE — Telephone Encounter (Signed)
Patient is overdue for general follow up. ?Needs to be scheduled for soon.  ? ?Let me know when she's been scheduled.  ?

## 2021-03-18 ENCOUNTER — Other Ambulatory Visit: Payer: Self-pay | Admitting: Primary Care

## 2021-03-18 DIAGNOSIS — I10 Essential (primary) hypertension: Secondary | ICD-10-CM

## 2021-03-18 NOTE — Telephone Encounter (Signed)
Patient set up follow up on 3/30.  ?

## 2021-03-18 NOTE — Telephone Encounter (Signed)
Noted. Refill(s) sent to pharmacy.  

## 2021-03-24 ENCOUNTER — Other Ambulatory Visit: Payer: Self-pay

## 2021-03-24 ENCOUNTER — Encounter: Payer: Self-pay | Admitting: Podiatry

## 2021-03-24 ENCOUNTER — Ambulatory Visit: Payer: Medicare Other | Admitting: Podiatry

## 2021-03-24 ENCOUNTER — Ambulatory Visit (INDEPENDENT_AMBULATORY_CARE_PROVIDER_SITE_OTHER): Payer: Medicare Other | Admitting: Podiatry

## 2021-03-24 DIAGNOSIS — D2372 Other benign neoplasm of skin of left lower limb, including hip: Secondary | ICD-10-CM

## 2021-03-24 NOTE — Progress Notes (Signed)
?Subjective:  ?Patient ID: Cheyenne Bordeaux, female    DOB: 1945/08/31,  MRN: [id number] ?HPI ?Chief Complaint  ?Patient presents with  ? Foot Pain  ?  Plantar heel left - small, callused lesion x several months, tender when walking, no treatment  ? New Patient (Initial Visit)  ? ? ?76 y.o. female presents with the above complaint.  ? ?ROS: Denies fever chills nausea vomiting muscle aches pains calf pain back pain chest pain shortness of breath. ? ?Past Medical History:  ?Diagnosis Date  ? Essential hypertension   ? Pernicious anemia   ? ?No past surgical history on file. ? ?Current Outpatient Medications:  ?  albuterol (VENTOLIN HFA) 108 (90 Base) MCG/ACT inhaler, INHALE 2 PUFFS INTO THE LUNGS EVERY 6 HOURS AS NEEDED FOR SHORTNESS OF BREATH, Disp: 54 g, Rfl: 0 ?  allopurinol (ZYLOPRIM) 100 MG tablet, TAKE 1/2 TABLET(50 MG) BY MOUTH DAILY FOR GOUT PREVENTION, Disp: 45 tablet, Rfl: 0 ?  amLODipine (NORVASC) 10 MG tablet, Take 1 tablet (10 mg total) by mouth daily. For blood pressure., Disp: 90 tablet, Rfl: 3 ?  calcitRIOL (ROCALTROL) 0.5 MCG capsule, Take 0.5 mcg by mouth daily. (Patient not taking: Reported on 02/25/2020), Disp: , Rfl:  ?  furosemide (LASIX) 20 MG tablet, Take 20 mg by mouth daily., Disp: , Rfl:  ?  irbesartan (AVAPRO) 300 MG tablet, TAKE 1 TABLET BY MOUTH DAILY FOR BLOOD PRESSURE. Office visit required for further refills., Disp: 30 tablet, Rfl: 0 ?  metoprolol tartrate (LOPRESSOR) 25 MG tablet, Take 25 mg by mouth 2 (two) times daily., Disp: , Rfl:  ?  raloxifene (EVISTA) 60 MG tablet, Take 60 mg by mouth daily., Disp: , Rfl:  ?  rosuvastatin (CRESTOR) 20 MG tablet, TAKE 1 TABLET(20 MG) BY MOUTH EVERY EVENING FOR CHOLESTEROL, Disp: 90 tablet, Rfl: 3 ? ?Allergies  ?Allergen Reactions  ? Penicillins Swelling  ?  Has patient had a PCN reaction causing immediate rash, facial/tongue/throat swelling, SOB or lightheadedness with hypotension: Yes ?Has patient had a PCN reaction causing severe rash involving  mucus membranes or skin necrosis: Yes ?Has patient had a PCN reaction that required hospitalization: No ?Has patient had a PCN reaction occurring within the last 10 years: No ?If all of the above answers are "NO", then may proceed with Cephalosporin use. ?  ? ?Review of Systems ?Objective:  ?There were no vitals filed for this visit. ? ?General: Well developed, nourished, in no acute distress, alert and oriented x3  ? ?Dermatological: Skin is warm, dry and supple bilateral. Nails x 10 are well maintained; remaining integument appears unremarkable at this time. There are no open sores, no preulcerative lesions, no rash or signs of infection present.  Benign skin lesion plantar aspect of the left heel no open lesions or wounds. ? ?Vascular: Dorsalis Pedis artery and Posterior Tibial artery pedal pulses are 2/4 bilateral with immedate capillary fill time. Pedal hair growth present. No varicosities and no lower extremity edema present bilateral.  ? ?Neruologic: Grossly intact via light touch bilateral. Vibratory intact via tuning fork bilateral. Protective threshold with Semmes Wienstein monofilament intact to all pedal sites bilateral. Patellar and Achilles deep tendon reflexes 2+ bilateral. No Babinski or clonus noted bilateral.  ? ?Musculoskeletal: No gross boney pedal deformities bilateral. No pain, crepitus, or limitation noted with foot and ankle range of motion bilateral. Muscular strength 5/5 in all groups tested bilateral. ? ?Gait: Unassisted, Nonantalgic.  ? ? ?Radiographs: ? ?None taken ? ?Assessment & Plan:  ? ?  Assessment: Benign skin lesion plantar left heel porokeratosis ? ?Plan: Debridement of reactive hyperkeratotic lesion. ? ? ? ? ?Katria Botts T. Annetta South, DPM ?

## 2021-04-08 ENCOUNTER — Ambulatory Visit: Payer: Medicare Other | Admitting: Primary Care

## 2021-04-08 ENCOUNTER — Other Ambulatory Visit: Payer: Self-pay | Admitting: *Deleted

## 2021-04-08 DIAGNOSIS — F17210 Nicotine dependence, cigarettes, uncomplicated: Secondary | ICD-10-CM

## 2021-04-08 DIAGNOSIS — Z87891 Personal history of nicotine dependence: Secondary | ICD-10-CM

## 2021-04-21 ENCOUNTER — Encounter: Payer: Self-pay | Admitting: Primary Care

## 2021-04-21 ENCOUNTER — Ambulatory Visit (INDEPENDENT_AMBULATORY_CARE_PROVIDER_SITE_OTHER): Payer: Medicare Other | Admitting: Primary Care

## 2021-04-21 VITALS — BP 140/84 | HR 81 | Ht 63.0 in | Wt 194.0 lb

## 2021-04-21 DIAGNOSIS — M810 Age-related osteoporosis without current pathological fracture: Secondary | ICD-10-CM

## 2021-04-21 DIAGNOSIS — E785 Hyperlipidemia, unspecified: Secondary | ICD-10-CM | POA: Diagnosis not present

## 2021-04-21 DIAGNOSIS — M109 Gout, unspecified: Secondary | ICD-10-CM

## 2021-04-21 DIAGNOSIS — N1832 Chronic kidney disease, stage 3b: Secondary | ICD-10-CM

## 2021-04-21 DIAGNOSIS — I700 Atherosclerosis of aorta: Secondary | ICD-10-CM

## 2021-04-21 DIAGNOSIS — Z720 Tobacco use: Secondary | ICD-10-CM

## 2021-04-21 DIAGNOSIS — R7303 Prediabetes: Secondary | ICD-10-CM | POA: Diagnosis not present

## 2021-04-21 DIAGNOSIS — I10 Essential (primary) hypertension: Secondary | ICD-10-CM

## 2021-04-21 DIAGNOSIS — D510 Vitamin B12 deficiency anemia due to intrinsic factor deficiency: Secondary | ICD-10-CM

## 2021-04-21 DIAGNOSIS — E213 Hyperparathyroidism, unspecified: Secondary | ICD-10-CM

## 2021-04-21 MED ORDER — AMLODIPINE BESYLATE 10 MG PO TABS: 10.0000 mg | ORAL_TABLET | Freq: Every day | ORAL | 3 refills | Status: DC

## 2021-04-21 MED ORDER — IRBESARTAN 300 MG PO TABS: ORAL_TABLET | ORAL | 3 refills | Status: DC

## 2021-04-21 MED ORDER — COLCHICINE 0.6 MG PO TABS: ORAL_TABLET | ORAL | 0 refills | Status: DC

## 2021-04-21 NOTE — Assessment & Plan Note (Signed)
Lung cancer screening CT pending. ? ?She is working on cutting back tobacco use. ?

## 2021-04-21 NOTE — Assessment & Plan Note (Signed)
CBC reviewed from February 2023 through care everywhere and appears stable ?

## 2021-04-21 NOTE — Assessment & Plan Note (Signed)
A1c pending today. ?

## 2021-04-21 NOTE — Assessment & Plan Note (Signed)
Unclear why she is following with 2 endocrinologists, discussed this with patient today.  Unfortunately she is a poor historian and does not understand either. ? ?I advised her to keep her follow-up appointment with Dr. Gabriel Carina.  ? ?Reviewed labs from care everywhere from February 2023. ?

## 2021-04-21 NOTE — Assessment & Plan Note (Signed)
Discussed that she does need to resume rosuvastatin.  Repeat lipid panel pending today.  We will send in prescription accordingly. ? ? ?The 10-year ASCVD risk score (Arnett DK, et al., 2019) is: 37.3% ?  Values used to calculate the score: ?    Age: 77 years ?    Sex: Female ?    Is Non-Hispanic African American: Yes ?    Diabetic: No ?    Tobacco smoker: Yes ?    Systolic Blood Pressure: 371 mmHg ?    Is BP treated: Yes ?    HDL Cholesterol: 77.7 mg/dL ?    Total Cholesterol: 201 mg/dL ? ?

## 2021-04-21 NOTE — Assessment & Plan Note (Signed)
Following with nuclear medicine/endocrinology.  Continue Evista 60 mg daily. ?

## 2021-04-21 NOTE — Assessment & Plan Note (Signed)
Poor historian, although it sounds like she may have a prescription for colchicine that is quite old. ? ?Long discussion today regarding the proper use of allopurinol which is daily. ? ?She will resume allopurinol 100 mg daily for gout prevention. ? ?I sent in a new prescription for colchicine 0.6 mg to use as needed for flares.  We had a long discussion regarding the instructions for this medication. ? ?Uric acid level reviewed from February 2023 through care everywhere ?

## 2021-04-21 NOTE — Assessment & Plan Note (Signed)
Following with nephrology, endocrinology, nuclear medicine. ? ?She understands to avoid NSAID medications. ? ?Continue blood pressure control.  Working on lipid control. ? ?Office notes from nephrology reviewed from February 2023 through care everywhere. ?

## 2021-04-21 NOTE — Patient Instructions (Addendum)
Stop by the lab prior to leaving today. I will notify you of your results once received.  ? ?Make sure Dr. Gabriel Carina knows that you have been seeing Dr. Ronnald Collum. I'm not sure if you need to continue to see him.  ? ?Bring all of your medications to each of your doctors appointments.  ? ?The allopurinol medication is for gout prevention. This is taken once daily, everyday.  ? ?I sent a gout medication to your pharmacy that can be taken as needed for gout flares.  The medication is called colchicine.  ? ?For gout flare: Take 2 tablets of the colchicine medicine immediately.  2 hours later take 1 more tablet.  Starting the following day take 1 tablet twice daily until your gout flare resolves. ? ?Complete your lung cancer screening as scheduled. ? ?It was a pleasure to see you today! ? ? ?

## 2021-04-21 NOTE — Progress Notes (Signed)
? ?Subjective:  ? ? Patient ID: Danielle Zamora, female    DOB: 07-01-1945, 76 y.o.   MRN: [id number] ? ?HPI ? ?Vadie Principato is a very pleasant 76 y.o. female with a history of hypertension, hyperparathyroidism, osteoporosis, CKD stage IV, prediabetes, gout, hyperlipidemia who presents today for follow-up of chronic conditions. ? ?She brings her medications with her today. ? ?1) Essential Hypertension: Currently managed on amlodipine 10 mg daily, irbesartan 300 mg daily, metoprolol tartrate 25 mg twice daily. ? ?Following with nuclear medicine, endocrinology, nephrology. ? ?BP Readings from Last 3 Encounters:  ?04/21/21 140/84  ?02/25/20 140/82  ?11/25/19 132/88  ? ?2) Hyperlipidemia: History of tobacco abuse.  Previously managed on rosuvastatin 20 mg daily, but patient has not taken in months. She was told by Dr. Ronnald Collum to stop taking as she "didn't need it".  ? ?The 10-year ASCVD risk score (Arnett DK, et al., 2019) is: 37.3% ?  Values used to calculate the score: ?    Age: 34 years ?    Sex: Female ?    Is Non-Hispanic African American: Yes ?    Diabetic: No ?    Tobacco smoker: Yes ?    Systolic Blood Pressure: 888 mmHg ?    Is BP treated: Yes ?    HDL Cholesterol: 77.7 mg/dL ?    Total Cholesterol: 201 mg/dL ? ? ?3) CKD Stage V: Following with nephrology, Dr. Lanora Manis. Last office visit was February 2023.  She underwent renal sonogram in 2022 which showed atrophic right kidney without hydronephrosis.  During this visit she was advised to continue her current medications, including the recent addition of allopurinol 100 mg daily. ? ?She resumed her allopurinol for a few days recently given some toe pain. She does not take allopurinol daily. Gout flares are typically felt to the left great toe.gout flares occur once monthly.  She has another medication at home that she uses as needed for flares, she obtained this from her prior doctor in [place]. ? ?4) Hypercalcemia/Hyperparathyroidism: Currently following  with endocrinology, now seeing Dr. Gabriel Carina. Last visit was in February 2023 with Dr. Gabriel Carina, she underwent multiple lab tests, has an appointment scheduled for follow up later this month.  ? ?She continues to follow with Dr. Ronnald Collum endocrinology/nuclear medicine. She is not sure why she was referred to Dr. Gabriel Carina. She has a follow up visit scheduled with Dr. Ronnald Collum this Summer.  ? ?Review of Systems  ?Respiratory:  Negative for shortness of breath.   ?Cardiovascular:  Negative for chest pain.  ?Musculoskeletal:  Positive for arthralgias.  ?Neurological:  Negative for dizziness and headaches.  ?Psychiatric/Behavioral:  The patient is not nervous/anxious.   ? ?   ? ? ?Past Medical History:  ?Diagnosis Date  ? Essential hypertension   ? Pernicious anemia   ? ? ?Social History  ? ?Socioeconomic History  ? Marital status: Widowed  ?  Spouse name: Not on file  ? Number of children: Not on file  ? Years of education: Not on file  ? Highest education level: Not on file  ?Occupational History  ? Not on file  ?Tobacco Use  ? Smoking status: Every Day  ?  Packs/day: 0.75  ?  Years: 56.00  ?  Pack years: 42.00  ?  Types: Cigarettes  ? Smokeless tobacco: Never  ?Vaping Use  ? Vaping Use: Never used  ?Substance and Sexual Activity  ? Alcohol use: Yes  ?  Alcohol/week: 2.0 standard drinks  ?  Types: 2  Shots of liquor per week  ?  Comment: daily  ? Drug use: Yes  ?  Types: Marijuana  ?  Comment: use it rarely  ? Sexual activity: Not on file  ?Other Topics Concern  ? Not on file  ?Social History Narrative  ? Widow.  ? 3 children, 9 grandchildren  ? Retired.   ? Moved from [place].  ? Enjoys spending time with family.  ? ?Social Determinants of Health  ? ?Financial Resource Strain: Not on file  ?Food Insecurity: Not on file  ?Transportation Needs: Not on file  ?Physical Activity: Not on file  ?Stress: Not on file  ?Social Connections: Not on file  ?Intimate Partner Violence: Not on file  ? ? ?No past surgical history on  file. ? ?Family History  ?Problem Relation Age of Onset  ? Kidney disease Mother   ? Breast cancer Sister   ? ? ?Allergies  ?Allergen Reactions  ? Penicillins Swelling  ?  Has patient had a PCN reaction causing immediate rash, facial/tongue/throat swelling, SOB or lightheadedness with hypotension: Yes ?Has patient had a PCN reaction causing severe rash involving mucus membranes or skin necrosis: Yes ?Has patient had a PCN reaction that required hospitalization: No ?Has patient had a PCN reaction occurring within the last 10 years: No ?If all of the above answers are "NO", then may proceed with Cephalosporin use. ?  ? ? ?Current Outpatient Medications on File Prior to Visit  ?Medication Sig Dispense Refill  ? amLODipine (NORVASC) 10 MG tablet Take 1 tablet (10 mg total) by mouth daily. For blood pressure. 90 tablet 3  ? irbesartan (AVAPRO) 300 MG tablet TAKE 1 TABLET BY MOUTH DAILY FOR BLOOD PRESSURE. Office visit required for further refills. 30 tablet 0  ? Magnesium 500 MG TABS Take by mouth. Taking 1 tablet once a day    ? metoprolol tartrate (LOPRESSOR) 25 MG tablet Take 25 mg by mouth 2 (two) times daily.    ? raloxifene (EVISTA) 60 MG tablet Take 60 mg by mouth daily.    ? furosemide (LASIX) 20 MG tablet Take 20 mg by mouth daily. (Patient not taking: Reported on 04/21/2021)    ? ?No current facility-administered medications on file prior to visit.  ? ? ?BP 140/84   Pulse 81   Ht '5\' 3"'$  (1.6 m)   Wt 194 lb (88 kg)   SpO2 99%   BMI 34.37 kg/m?  ?Objective:  ? Physical Exam ?Cardiovascular:  ?   Rate and Rhythm: Normal rate and regular rhythm.  ?Pulmonary:  ?   Effort: Pulmonary effort is normal.  ?   Breath sounds: Normal breath sounds.  ?Musculoskeletal:  ?   Cervical back: Neck supple.  ?Skin: ?   General: Skin is warm and dry.  ?Psychiatric:     ?   Mood and Affect: Mood normal.  ? ? ? ? ? ?   ?Assessment & Plan:  ? ? ? ? ?This visit occurred during the SARS-CoV-2 public health emergency.  Safety protocols  were in place, including screening questions prior to the visit, additional usage of staff PPE, and extensive cleaning of exam room while observing appropriate contact time as indicated for disinfecting solutions.  ?

## 2021-04-21 NOTE — Assessment & Plan Note (Signed)
She must resume statin therapy given her history of tobacco abuse, aortic atherosclerosis, and hypertension. ? ?Repeat lipid panel pending.  We will resume rosuvastatin accordingly once labs have returned. ? ? ?The 10-year ASCVD risk score (Arnett DK, et al., 2019) is: 37.3% ?  Values used to calculate the score: ?    Age: 76 years ?    Sex: Female ?    Is Non-Hispanic African American: Yes ?    Diabetic: No ?    Tobacco smoker: Yes ?    Systolic Blood Pressure: 740 mmHg ?    Is BP treated: Yes ?    HDL Cholesterol: 77.7 mg/dL ?    Total Cholesterol: 201 mg/dL ? ?

## 2021-04-21 NOTE — Assessment & Plan Note (Signed)
Overall stable. ? ?Continue irbesartan 300 mg daily, amlodipine 10 mg daily, metoprolol tartrate twice daily. ? ?Labs reviewed from care everywhere from visit in February 2023. ?

## 2021-04-22 ENCOUNTER — Telehealth: Payer: Self-pay

## 2021-04-22 DIAGNOSIS — I700 Atherosclerosis of aorta: Secondary | ICD-10-CM

## 2021-04-22 DIAGNOSIS — E785 Hyperlipidemia, unspecified: Secondary | ICD-10-CM

## 2021-04-22 LAB — LIPID PANEL
Cholesterol: 255 mg/dL — ABNORMAL HIGH (ref 0–200)
HDL: 61 mg/dL (ref >39.00)
NonHDL: 194.28
Total CHOL/HDL Ratio: 4
Triglycerides: 218 mg/dL — ABNORMAL HIGH (ref 0.0–149.0)
VLDL: 43.6 mg/dL — ABNORMAL HIGH (ref 0.0–40.0)

## 2021-04-22 LAB — HEMOGLOBIN A1C: Hgb A1c MFr Bld: 5.7 % (ref 4.6–6.5)

## 2021-04-22 LAB — LDL CHOLESTEROL, DIRECT: Direct LDL: 171 mg/dL

## 2021-04-22 NOTE — Telephone Encounter (Signed)
I spoke with pt and she said she only wants to speak with Danielle Zamora to ask her question. I advised pt I would send note to Danielle Zamora but if I could include more information about what the question is that would be helpful and pt said no she only wants to speak with Danielle Zamora. I tried to verify contact # and pt said depended on time of day and to try both numbers. I advised pt I would send note to Danielle Zamora but she was seeing pts and I could not confirm a time that Anda Kraft would be able to cb and pt said OK. Sending note to Danielle Zamora and [facility] CMA. ?

## 2021-04-22 NOTE — Telephone Encounter (Signed)
[facility] Night - Client ?Nonclinical Telephone Record  ?[facility]? ?Client [facility] - Client ?Client Site [place] ?Provider Alma Friendly - NP ?Contact Type Call ?Who Is Calling Patient / Member / Family / Caregiver ?Caller Name Henessy Rohrer ?Caller Phone Number [phone] ?Patient Name Danielle Zamora ?Patient DOB Jan 08, 1946 ?Call Type Message Only Information Provided ?Reason for Call Request for General Office Information ?Initial Comment Caller states she came in today but forgot to ask Alma Friendly a question. ?Additional Comment [phone] is home phone. ?Disp. Time Disposition Final User ?04/21/2021 6:23:58 PM General Information Provided Yes Junius Creamer ?Call Closed By: Junius Creamer ?Transaction Date/Time: 04/21/2021 6:19:53 PM (ET ?

## 2021-04-23 ENCOUNTER — Other Ambulatory Visit: Payer: Self-pay | Admitting: Primary Care

## 2021-04-23 ENCOUNTER — Other Ambulatory Visit: Payer: Self-pay

## 2021-04-23 ENCOUNTER — Ambulatory Visit
Admission: RE | Admit: 2021-04-23 | Discharge: 2021-04-23 | Disposition: A | Discharge status: Home / Self Care | Payer: Medicare Other | Source: Ambulatory Visit | Attending: Acute Care | Admitting: Acute Care

## 2021-04-23 DIAGNOSIS — Z87891 Personal history of nicotine dependence: Secondary | ICD-10-CM | POA: Insufficient documentation

## 2021-04-23 DIAGNOSIS — F17210 Nicotine dependence, cigarettes, uncomplicated: Secondary | ICD-10-CM | POA: Diagnosis not present

## 2021-04-23 DIAGNOSIS — E785 Hyperlipidemia, unspecified: Secondary | ICD-10-CM

## 2021-04-23 MED ORDER — ATORVASTATIN CALCIUM 40 MG PO TABS: 40.0000 mg | ORAL_TABLET | Freq: Every evening | ORAL | 3 refills | Status: DC

## 2021-04-23 NOTE — Telephone Encounter (Signed)
Spoke with patient via phone who was requesting a prescription for medical marijuana for glaucoma treatment. We discussed that I do not prescribe medical marijuana.  I recommended she discuss glaucoma treatment with her optometrist. ? ?We also discussed her labs via phone.  We will resume her atorvastatin 40 mg daily.  We will repeat her lipid panel in 2 months. ?

## 2021-04-27 ENCOUNTER — Other Ambulatory Visit: Payer: Self-pay | Admitting: Acute Care

## 2021-04-27 DIAGNOSIS — Z122 Encounter for screening for malignant neoplasm of respiratory organs: Secondary | ICD-10-CM

## 2021-04-27 DIAGNOSIS — F17210 Nicotine dependence, cigarettes, uncomplicated: Secondary | ICD-10-CM

## 2021-04-27 DIAGNOSIS — Z87891 Personal history of nicotine dependence: Secondary | ICD-10-CM

## 2021-05-11 ENCOUNTER — Emergency Department: Payer: Medicare Other

## 2021-05-11 ENCOUNTER — Encounter: Payer: Self-pay | Admitting: Emergency Medicine

## 2021-05-11 ENCOUNTER — Telehealth: Payer: Self-pay

## 2021-05-11 ENCOUNTER — Inpatient Hospital Stay
Admission: EM | Admit: 2021-05-11 | Discharge: 2021-05-14 | DRG: 683 | Disposition: A | Discharge status: Skilled Nursing Facility | Payer: Medicare Other | Attending: Internal Medicine | Admitting: Internal Medicine

## 2021-05-11 ENCOUNTER — Other Ambulatory Visit: Payer: Self-pay

## 2021-05-11 DIAGNOSIS — Z841 Family history of disorders of kidney and ureter: Secondary | ICD-10-CM | POA: Diagnosis not present

## 2021-05-11 DIAGNOSIS — F17210 Nicotine dependence, cigarettes, uncomplicated: Secondary | ICD-10-CM | POA: Diagnosis present

## 2021-05-11 DIAGNOSIS — I10 Essential (primary) hypertension: Secondary | ICD-10-CM | POA: Diagnosis present

## 2021-05-11 DIAGNOSIS — E875 Hyperkalemia: Secondary | ICD-10-CM | POA: Diagnosis present

## 2021-05-11 DIAGNOSIS — Z880 Allergy status to penicillin: Secondary | ICD-10-CM | POA: Diagnosis not present

## 2021-05-11 DIAGNOSIS — N1832 Chronic kidney disease, stage 3b: Secondary | ICD-10-CM | POA: Diagnosis present

## 2021-05-11 DIAGNOSIS — E213 Hyperparathyroidism, unspecified: Secondary | ICD-10-CM | POA: Diagnosis present

## 2021-05-11 DIAGNOSIS — Y92009 Unspecified place in unspecified non-institutional (private) residence as the place of occurrence of the external cause: Secondary | ICD-10-CM

## 2021-05-11 DIAGNOSIS — Z79899 Other long term (current) drug therapy: Secondary | ICD-10-CM | POA: Diagnosis not present

## 2021-05-11 DIAGNOSIS — I129 Hypertensive chronic kidney disease with stage 1 through stage 4 chronic kidney disease, or unspecified chronic kidney disease: Secondary | ICD-10-CM | POA: Diagnosis present

## 2021-05-11 DIAGNOSIS — E872 Acidosis, unspecified: Secondary | ICD-10-CM | POA: Diagnosis present

## 2021-05-11 DIAGNOSIS — Z20822 Contact with and (suspected) exposure to covid-19: Secondary | ICD-10-CM | POA: Diagnosis present

## 2021-05-11 DIAGNOSIS — W109XXA Fall (on) (from) unspecified stairs and steps, initial encounter: Secondary | ICD-10-CM | POA: Diagnosis present

## 2021-05-11 DIAGNOSIS — E669 Obesity, unspecified: Secondary | ICD-10-CM | POA: Diagnosis present

## 2021-05-11 DIAGNOSIS — N179 Acute kidney failure, unspecified: Principal | ICD-10-CM | POA: Diagnosis present

## 2021-05-11 DIAGNOSIS — Z720 Tobacco use: Secondary | ICD-10-CM | POA: Diagnosis present

## 2021-05-11 DIAGNOSIS — Z6834 Body mass index (BMI) 34.0-34.9, adult: Secondary | ICD-10-CM

## 2021-05-11 DIAGNOSIS — D351 Benign neoplasm of parathyroid gland: Secondary | ICD-10-CM | POA: Diagnosis present

## 2021-05-11 DIAGNOSIS — D631 Anemia in chronic kidney disease: Secondary | ICD-10-CM | POA: Diagnosis present

## 2021-05-11 DIAGNOSIS — W19XXXA Unspecified fall, initial encounter: Secondary | ICD-10-CM | POA: Diagnosis present

## 2021-05-11 DIAGNOSIS — R531 Weakness: Secondary | ICD-10-CM | POA: Diagnosis not present

## 2021-05-11 DIAGNOSIS — R7989 Other specified abnormal findings of blood chemistry: Secondary | ICD-10-CM | POA: Diagnosis present

## 2021-05-11 LAB — BASIC METABOLIC PANEL
Anion gap: 6 (ref 5–15)
BUN: 49 mg/dL — ABNORMAL HIGH (ref 8–23)
CO2: 19 mmol/L — ABNORMAL LOW (ref 22–32)
Calcium: 10 mg/dL (ref 8.9–10.3)
Chloride: 110 mmol/L (ref 98–111)
Creatinine, Ser: 3.3 mg/dL — ABNORMAL HIGH (ref 0.44–1.00)
GFR, Estimated: 14 mL/min — ABNORMAL LOW (ref >60)
Glucose, Bld: 114 mg/dL — ABNORMAL HIGH (ref 70–99)
Potassium: 5.8 mmol/L — ABNORMAL HIGH (ref 3.5–5.1)
Sodium: 135 mmol/L (ref 135–145)

## 2021-05-11 LAB — CBC WITH DIFFERENTIAL/PLATELET
Abs Immature Granulocytes: 0.03 10*3/uL (ref 0.00–0.07)
Basophils Absolute: 0.1 10*3/uL (ref 0.0–0.1)
Basophils Relative: 1 %
Eosinophils Absolute: 0.6 10*3/uL — ABNORMAL HIGH (ref 0.0–0.5)
Eosinophils Relative: 8 %
HCT: 31.8 % — ABNORMAL LOW (ref 36.0–46.0)
Hemoglobin: 10 g/dL — ABNORMAL LOW (ref 12.0–15.0)
Immature Granulocytes: 0 %
Lymphocytes Relative: 22 %
Lymphs Abs: 1.7 10*3/uL (ref 0.7–4.0)
MCH: 28.7 pg (ref 26.0–34.0)
MCHC: 31.4 g/dL (ref 30.0–36.0)
MCV: 91.4 fL (ref 80.0–100.0)
Monocytes Absolute: 1 10*3/uL (ref 0.1–1.0)
Monocytes Relative: 13 %
Neutro Abs: 4.3 10*3/uL (ref 1.7–7.7)
Neutrophils Relative %: 56 %
Platelets: 314 10*3/uL (ref 150–400)
RBC: 3.48 MIL/uL — ABNORMAL LOW (ref 3.87–5.11)
RDW: 15 % (ref 11.5–15.5)
WBC: 7.7 10*3/uL (ref 4.0–10.5)
nRBC: 0 % (ref 0.0–0.2)

## 2021-05-11 LAB — IRON AND TIBC
Iron: 115 ug/dL (ref 28–170)
Saturation Ratios: 65 % — ABNORMAL HIGH (ref 10.4–31.8)
TIBC: 178 ug/dL — ABNORMAL LOW (ref 250–450)
UIBC: 63 ug/dL

## 2021-05-11 LAB — CK: Total CK: 148 U/L (ref 38–234)

## 2021-05-11 LAB — URINALYSIS, ROUTINE W REFLEX MICROSCOPIC
Bilirubin Urine: NEGATIVE
Glucose, UA: NEGATIVE mg/dL
Ketones, ur: NEGATIVE mg/dL
Leukocytes,Ua: NEGATIVE
Nitrite: NEGATIVE
Protein, ur: 30 mg/dL — AB
Specific Gravity, Urine: 1.015 (ref 1.005–1.030)
pH: 5 (ref 5.0–8.0)

## 2021-05-11 LAB — RESP PANEL BY RT-PCR (FLU A&B, COVID) ARPGX2
Influenza A by PCR: NEGATIVE
Influenza B by PCR: NEGATIVE
SARS Coronavirus 2 by RT PCR: NEGATIVE

## 2021-05-11 MED ORDER — SODIUM CHLORIDE 0.9 % IV SOLN: INTRAVENOUS | Status: DC

## 2021-05-11 MED ORDER — MAGNESIUM 500 MG PO TABS: 1.0000 | ORAL_TABLET | Freq: Every day | ORAL | Status: DC

## 2021-05-11 MED ORDER — ACETAMINOPHEN 500 MG PO TABS
1000.0000 mg | ORAL_TABLET | Freq: Once | ORAL | Status: AC
  Administered 2021-05-11: 1000 mg via ORAL
  Filled 2021-05-11: qty 2

## 2021-05-11 MED ORDER — PATIROMER SORBITEX CALCIUM 8.4 G PO PACK
16.8000 g | PACK | Freq: Every day | ORAL | Status: DC
  Administered 2021-05-11 – 2021-05-12 (×2): 16.8 g via ORAL
  Filled 2021-05-11 (×3): qty 2

## 2021-05-11 MED ORDER — ACETAMINOPHEN 650 MG RE SUPP: 650.0000 mg | Freq: Four times a day (QID) | RECTAL | Status: DC | PRN

## 2021-05-11 MED ORDER — ACETAMINOPHEN 325 MG PO TABS: 650.0000 mg | ORAL_TABLET | Freq: Four times a day (QID) | ORAL | Status: DC | PRN

## 2021-05-11 MED ORDER — RALOXIFENE HCL 60 MG PO TABS
60.0000 mg | ORAL_TABLET | Freq: Every day | ORAL | Status: DC
  Administered 2021-05-11 – 2021-05-14 (×4): 60 mg via ORAL
  Filled 2021-05-11 (×4): qty 1

## 2021-05-11 MED ORDER — ONDANSETRON HCL 4 MG PO TABS: 4.0000 mg | ORAL_TABLET | Freq: Four times a day (QID) | ORAL | Status: DC | PRN

## 2021-05-11 MED ORDER — DEXTROSE 50 % IV SOLN
1.0000 | Freq: Once | INTRAVENOUS | Status: AC
  Administered 2021-05-11: 50 mL via INTRAVENOUS
  Filled 2021-05-11: qty 50

## 2021-05-11 MED ORDER — INSULIN ASPART 100 UNIT/ML IV SOLN
5.0000 [IU] | Freq: Once | INTRAVENOUS | Status: AC
  Administered 2021-05-11: 5 [IU] via INTRAVENOUS
  Filled 2021-05-11: qty 0.05

## 2021-05-11 MED ORDER — ONDANSETRON HCL 4 MG/2ML IJ SOLN: 4.0000 mg | Freq: Four times a day (QID) | INTRAMUSCULAR | Status: DC | PRN

## 2021-05-11 MED ORDER — ATORVASTATIN CALCIUM 20 MG PO TABS
40.0000 mg | ORAL_TABLET | Freq: Every evening | ORAL | Status: DC
  Administered 2021-05-11 – 2021-05-13 (×3): 40 mg via ORAL
  Filled 2021-05-11 (×3): qty 2

## 2021-05-11 MED ORDER — LACTATED RINGERS IV BOLUS
1000.0000 mL | Freq: Once | INTRAVENOUS | Status: AC
  Administered 2021-05-11: 1000 mL via INTRAVENOUS

## 2021-05-11 MED ORDER — ENOXAPARIN SODIUM 30 MG/0.3ML IJ SOSY
30.0000 mg | PREFILLED_SYRINGE | INTRAMUSCULAR | Status: DC
  Administered 2021-05-11 – 2021-05-13 (×3): 30 mg via SUBCUTANEOUS
  Filled 2021-05-11 (×3): qty 0.3

## 2021-05-11 MED ORDER — NICOTINE 14 MG/24HR TD PT24
14.0000 mg | MEDICATED_PATCH | Freq: Every day | TRANSDERMAL | Status: DC
Start: 2021-05-11 — End: 2021-05-14
  Administered 2021-05-11 – 2021-05-14 (×4): 14 mg via TRANSDERMAL
  Filled 2021-05-11 (×4): qty 1

## 2021-05-11 NOTE — Assessment & Plan Note (Signed)
Status post mechanical fall ?Place patient on fall precautions ?PT evaluation ?

## 2021-05-11 NOTE — Assessment & Plan Note (Signed)
Noted to have a hemoglobin of 10 compared to baseline of 12.4 from a year ago ?Anemia is most likely related to known chronic kidney disease ?Obtain iron studies ?Monitor H&H closely ?

## 2021-05-11 NOTE — H&P (Signed)
?History and Physical  ? ? ?Patient: Danielle Zamora DEY:[id number] DOB: 1945/05/22 ?DOA: 05/11/2021 ?DOS: the patient was seen and examined on 05/11/2021 ?PCP: Pleas Koch, NP  ?Patient coming from: Home ? ?Chief Complaint:  ?Chief Complaint  ?Patient presents with  ? Fall  ? ?HPI: Danielle Zamora is a 76 y.o. female with medical history significant for hypertension, pernicious anemia, nicotine and marijuana use who presents to the ER for evaluation after she had a mechanical fall the day prior to her admission.  She states that she was walking up the steps at her home and she was almost at the top when she felt too weak and did not think she could make it so she turned around and started to go back down.  When she got to the bottom of the stairs she lost her balance and fell landing on her right side.  She denied hitting her head or losing consciousness. ?She states that she has not felt well for over a week and has had decreased sense of smell, decreased taste, poor appetite, nausea and 1 episode of emesis. ?She complains of chills but denies having any fever, no headache, no blurred vision, no chest pain, no shortness of breath, no diaphoresis, no palpitations, no dizziness or lightheadedness. ?Review of Systems: As mentioned in the history of present illness. All other systems reviewed and are negative. ?Past Medical History:  ?Diagnosis Date  ? Essential hypertension   ? Pernicious anemia   ? ?History reviewed. No pertinent surgical history. ?Social History:  reports that she has been smoking cigarettes. She has a 42.00 pack-year smoking history. She has never used smokeless tobacco. She reports current alcohol use of about 2.0 standard drinks per week. She reports current drug use. Drug: Marijuana. ? ?Allergies  ?Allergen Reactions  ? Penicillins Swelling  ?  Has patient had a PCN reaction causing immediate rash, facial/tongue/throat swelling, SOB or lightheadedness with hypotension: Yes ?Has patient had a PCN  reaction causing severe rash involving mucus membranes or skin necrosis: Yes ?Has patient had a PCN reaction that required hospitalization: No ?Has patient had a PCN reaction occurring within the last 10 years: No ?If all of the above answers are "NO", then may proceed with Cephalosporin use. ?  ? ? ?Family History  ?Problem Relation Age of Onset  ? Kidney disease Mother   ? Breast cancer Sister   ? ? ?Prior to Admission medications   ?Medication Sig Start Date End Date Taking? Authorizing Provider  ?amLODipine (NORVASC) 10 MG tablet Take 1 tablet (10 mg total) by mouth daily. For blood pressure. 04/21/21  Yes Pleas Koch, NP  ?atorvastatin (LIPITOR) 40 MG tablet Take 1 tablet (40 mg total) by mouth every evening. for cholesterol. 04/23/21  Yes Pleas Koch, NP  ?colchicine 0.6 MG tablet Take 2 tablets by mouth at gout flare onset, take 1 additional tablet 2 hours later.  Then take 1 tablet twice daily until gout flare resolves. 04/21/21  Yes Pleas Koch, NP  ?irbesartan (AVAPRO) 300 MG tablet TAKE 1 TABLET BY MOUTH DAILY FOR BLOOD PRESSURE. 04/21/21  Yes Pleas Koch, NP  ?Magnesium 500 MG TABS Take by mouth. Taking 1 tablet once a day   Yes [provider]  ?metoprolol tartrate (LOPRESSOR) 25 MG tablet Take 25 mg by mouth 2 (two) times daily. 03/03/21  Yes [provider]  ?raloxifene (EVISTA) 60 MG tablet Take 60 mg by mouth daily. 01/19/21  Yes [provider]  ?furosemide (  LASIX) 20 MG tablet Take 20 mg by mouth daily. ?Patient not taking: Reported on 04/21/2021 11/22/19   [provider]  ? ? ?Physical Exam: ?Vitals:  ? 05/11/21 1145 05/11/21 1146  ?BP: 108/70   ?Pulse: 61   ?Resp: 18   ?Temp: 97.6 ?F (36.4 ?C)   ?TempSrc: Oral   ?Weight:  88.5 kg  ?Height:  '5\' 3"'$  (1.6 m)  ? ?Physical Exam ?Vitals and nursing note reviewed.  ?Constitutional:   ?   Appearance: Normal appearance.  ?HENT:  ?   Head: Normocephalic and atraumatic.  ?   Nose: Nose normal.  ?    Mouth/Throat:  ?   Mouth: Mucous membranes are dry.  ?Eyes:  ?   Pupils: Pupils are equal, round, and reactive to light.  ?Cardiovascular:  ?   Rate and Rhythm: Normal rate and regular rhythm.  ?Pulmonary:  ?   Effort: Pulmonary effort is normal.  ?   Breath sounds: Normal breath sounds.  ?Abdominal:  ?   General: Abdomen is flat. Bowel sounds are normal.  ?   Palpations: Abdomen is soft.  ?Musculoskeletal:  ?   Cervical back: Normal range of motion and neck supple.  ?   Comments: Decreased range of motion right hip  ?Neurological:  ?   General: No focal deficit present.  ?   Mental Status: She is alert.  ?   Motor: Weakness present.  ?Psychiatric:     ?   Mood and Affect: Mood normal.     ?   Behavior: Behavior normal.  ? ? ?Data Reviewed: ?Relevant notes from primary care and specialist visits, past discharge summaries as available in EHR, including Care Everywhere. ?Prior diagnostic testing as pertinent to current admission diagnoses ?Updated medications and problem lists for reconciliation ?ED course, including vitals, labs, imaging, treatment and response to treatment ?Triage notes, nursing and pharmacy notes and ED provider's notes ?Notable results as noted in HPI ?Labs reviewed.  Sodium 135, potassium 5.8, chloride 110, bicarb 19, glucose 114, BUN 49, creatinine 3.30 compared to baseline of 1.59, calcium 10.0, white count 7.7, hemoglobin 10, hematocrit 31.8, platelet count 314 ?X-ray of the pelvis shows no acute abnormality ?Right femur x-ray shows no acute osseous abnormality ?There are no new results to review at this time. ? ?Assessment and Plan: ?* AKI (acute kidney injury) ([place]) ?Appears to be multifactorial and secondary to prerenal, related to poor oral intake as well as medication induced ?Hold Avapro and furosemide ?IV fluid hydration ?Repeat renal parameters in a.m. and if no improvement will obtain renal ultrasound ?Check CK levels ? ?Stage 3b chronic kidney disease ([place]) ?Patient has baseline  stage IIIb chronic kidney disease most likely related to hypertension  ? ?Fall ?Status post mechanical fall ?Place patient on fall precautions ?PT evaluation ? ?Essential hypertension ?Patient is normotensive ?Hold amlodipine, Avapro and metoprolol ? ?Tobacco abuse ?Smoking cessation has been discussed with patient ?We will place her on nicotine transdermal 14 mg daily ? ?Anemia due to chronic kidney disease ?Noted to have a hemoglobin of 10 compared to baseline of 12.4 from a year ago ?Anemia is most likely related to known chronic kidney disease ?Obtain iron studies ?Monitor H&H closely ? ?Hyperkalemia ?Most likely medication induced ?Hold Avapro ?Patient received insulin and dextrose as well as a dose of Veltassa ? ? ? ? ? Advance Care Planning:   Code Status: Full Code  ? ?Consults: Physical therapy ? ?Family Communication: Greater than 50% of time was spent discussing  patient's condition and plan of care with her at the bedside.  All questions and concerns have been addressed.  She verbalizes understanding and agrees with the plan. ? ?Severity of Illness: ?The appropriate patient status for this patient is INPATIENT. Inpatient status is judged to be reasonable and necessary in order to provide the required intensity of service to ensure the patient's safety. The patient's presenting symptoms, physical exam findings, and initial radiographic and laboratory data in the context of their chronic comorbidities is felt to place them at high risk for further clinical deterioration. Furthermore, it is not anticipated that the patient will be medically stable for discharge from the hospital within 2 midnights of admission.  ? ?* I certify that at the point of admission it is my clinical judgment that the patient will require inpatient hospital care spanning beyond 2 midnights from the point of admission due to high intensity of service, high risk for further deterioration and high frequency of surveillance  required.* ? ?Author: ?Collier Bullock, MD ?05/11/2021 2:29 PM ? ?For on call review [email or web address].  ?

## 2021-05-11 NOTE — Assessment & Plan Note (Signed)
Patient is normotensive ?Hold amlodipine, Avapro and metoprolol ?

## 2021-05-11 NOTE — Assessment & Plan Note (Addendum)
Appears to be multifactorial and secondary to prerenal, related to poor oral intake as well as medication induced ?Hold Avapro and furosemide ?IV fluid hydration ?Repeat renal parameters in a.m. and if no improvement will obtain renal ultrasound ?Check CK levels ?

## 2021-05-11 NOTE — Telephone Encounter (Signed)
Patient had fall last night. States that hit right side of body. She did not hit head or loose consciousness. She is very sore and not able to get up and move around. She wanted to see if kate would call her in something for pain. Advised her that she needs to be evaluated ASAP. She is not able to walk or get up. Asked if someone was there that could help her get to ED. She states not a this time. Asked if she would like for me to call 911. Patient declined will give someone a call to come get her now. Let her know I will send this to Anda Kraft so that she is aware. Asked where she was going to go for evaluation she is not sure but will go.  ?

## 2021-05-11 NOTE — ED Provider Triage Note (Signed)
Emergency Medicine Provider Triage Evaluation Note ? ?Danielle Zamora , a 76 y.o. female  was evaluated in triage.  Pt complains of right hip and leg pain. ? ?Review of Systems  ?Positive: Fall ?Negative: And injury ? ?Physical Exam  ?BP 108/70 (BP Location: Left Arm)   Pulse 61   Temp 97.6 ?F (36.4 ?C) (Oral)   Resp 18   Ht '5\' 3"'$  (1.6 m)   Wt 88.5 kg   BMI 34.54 kg/m?  ?Gen:   Awake, no distress  ?Resp:  Normal effort  ?MSK:  Right hip is tender to palpation, right femur is tender to palpation ?Other:  Neurovascular intact ? ?Medical Decision Making  ?Medically screening exam initiated at 11:47 AM.  Appropriate orders placed.  Danielle Zamora was informed that the remainder of the evaluation will be completed by another provider, this initial triage assessment does not replace that evaluation, and the importance of remaining in the ED until their evaluation is complete. ? ? ?  ?Versie Starks, PA-C ?05/11/21 1148 ? ?

## 2021-05-11 NOTE — ED Notes (Signed)
Needs oxygen. Pt has trouble getting readings in past. ?

## 2021-05-11 NOTE — ED Provider Notes (Signed)
? ?[facility] ?Provider Note ? ? ? Event Date/Time  ? First MD Initiated Contact with Patient 05/11/21 1210   ?  (approximate) ? ? ?History  ? ?Fall ? ? ?HPI ? ?Danielle Zamora is a 76 y.o. female with a past history of hypertension, CKD, vitamin D deficiency with pernicious anemia who comes the ED complaining of a trip and fall yesterday resulting in right upper leg pain. ?She reports being in her usual state of health, and she was trying to walk up the steps at her home using her cane and by the time she was almost at the top she felt too weak to make it, so she turned around and started to go back down the steps.  When she got to the bottom she lost her balance and fell onto her left side.  No head injury or loss of consciousness.  Denies neck pain paresthesias weakness vision changes or headache. ? ?Pain is only in the right thigh, no other complaints.  No chest pain or shortness of breath during the episode on the stairs. ?  ? ? ?Physical Exam  ? ?Triage Vital Signs: ?ED Triage Vitals  ?Enc Vitals Group  ?   BP 05/11/21 1145 108/70  ?   Pulse Rate 05/11/21 1145 61  ?   Resp 05/11/21 1145 18  ?   Temp 05/11/21 1145 97.6 ?F (36.4 ?C)  ?   Temp Source 05/11/21 1145 Oral  ?   SpO2 --   ?   Weight 05/11/21 1146 195 lb (88.5 kg)  ?   Height 05/11/21 1146 '5\' 3"'$  (1.6 m)  ?   Head Circumference --   ?   Peak Flow --   ?   Pain Score 05/11/21 1137 9  ?   Pain Loc --   ?   Pain Edu? --   ?   Excl. in [place]? --   ? ? ?Most recent vital signs: ?Vitals:  ? 05/11/21 1145  ?BP: 108/70  ?Pulse: 61  ?Resp: 18  ?Temp: 97.6 ?F (36.4 ?C)  ? ? ? ?General: Awake, no distress.  ?CV:  Good peripheral perfusion.  Regular rate rhythm ?Resp:  Normal effort.  Clear to auscultation bilaterally ?Abd:  No distention.  Soft with mild suprapubic tenderness ?Other:  No bony tenderness or deformity in the right leg, no open wounds.  Full range of motion in all extremities. ? ? ?ED Results / Procedures / Treatments  ? ?Labs ?(all  labs ordered are listed, but only abnormal results are displayed) ?Labs Reviewed  ?BASIC METABOLIC PANEL - Abnormal; Notable for the following components:  ?    Result Value  ? Potassium 5.8 (*)   ? CO2 19 (*)   ? Glucose, Bld 114 (*)   ? BUN 49 (*)   ? Creatinine, Ser 3.30 (*)   ? GFR, Estimated 14 (*)   ? All other components within normal limits  ?CBC WITH DIFFERENTIAL/PLATELET - Abnormal; Notable for the following components:  ? RBC 3.48 (*)   ? Hemoglobin 10.0 (*)   ? HCT 31.8 (*)   ? Eosinophils Absolute 0.6 (*)   ? All other components within normal limits  ?URINE CULTURE  ?RESP PANEL BY RT-PCR (FLU A&B, COVID) [id number]  ?URINALYSIS, ROUTINE W REFLEX MICROSCOPIC  ? ? ? ?EKG ? ? ? ? ?RADIOLOGY ?X-ray pelvis and right femur reviewed and interpreted by me, negative for fracture or other acute findings.  Radiology report reviewed. ? ? ? ?  PROCEDURES: ? ?Critical Care performed: No ? ?Procedures ? ? ?MEDICATIONS ORDERED IN ED: ?Medications  ?patiromer Daryll Drown) packet 16.8 g (has no administration in time range)  ?acetaminophen (TYLENOL) tablet 1,000 mg (1,000 mg Oral Given 05/11/21 1243)  ?lactated ringers bolus 1,000 mL (1,000 mLs Intravenous New Bag/Given 05/11/21 1332)  ? ? ? ?IMPRESSION / MDM / ASSESSMENT AND PLAN / ED COURSE  ?I reviewed the triage vital signs and the nursing notes. ?             ?               ? ?Differential diagnosis includes, but is not limited to, pelvis fracture, femur fracture, dehydration, anemia ? ? ? ?Patient presents with a mechanical fall at home yesterday.  Exam and x-rays are benign.  Due to her age and comorbidities, I will investigate the fatigue is a symptom that is presumably worse than her baseline by obtaining urinalysis and serum labs. ?Clinical Course as of 05/11/21 1340  ?Tue May 11, 2021  ?1315 Labs show acute renal insufficiency with a creatinine of 3.3, increased from her baseline creatinine of 1.3 seen on outside record labs from February 2023.  Hemoglobin is stable.   Awaiting urinalysis.  Will give IV fluids and plan to admit for AKI given patient's high risk for further injury at home alone. [PS]  ?  ?Clinical Course User Index ?[PS] Carrie Mew, MD  ? ? ? ?----------------------------------------- ?1:41 PM on 05/11/2021 ?----------------------------------------- ?Care discussed with hospitalist.  Veltassa ordered for patient's hyperkalemia. ? ?FINAL CLINICAL IMPRESSION(S) / ED DIAGNOSES  ? ?Final diagnoses:  ?AKI (acute kidney injury) ([place])  ?Fall in home, initial encounter  ?Generalized weakness  ? ? ? ?Rx / DC Orders  ? ?ED Discharge Orders   ? ? None  ? ?  ? ? ? ?Note:  This document was prepared using Dragon voice recognition software and may include unintentional dictation errors. ?  ?Carrie Mew, MD ?05/11/21 1341 ? ?

## 2021-05-11 NOTE — ED Triage Notes (Signed)
Pt comes into the ED via EMS from home, tripped and fell yesterday and is c/o RLE pain.  ? ? ?140/86 ?HR80 ?EH21 ?98%RA ?

## 2021-05-11 NOTE — Assessment & Plan Note (Signed)
Smoking cessation has been discussed with patient ?We will place her on nicotine transdermal 14 mg daily ?

## 2021-05-11 NOTE — Assessment & Plan Note (Signed)
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

## 2021-05-11 NOTE — Assessment & Plan Note (Signed)
Most likely medication induced ?Hold Avapro ?Patient received insulin and dextrose as well as a dose of Veltassa ?

## 2021-05-11 NOTE — Plan of Care (Signed)

## 2021-05-11 NOTE — ED Notes (Addendum)
See triage note  presents via EMS s/p fall yesterday  states she was trying to go up with steps    fell down   having pain to right leg  unable to bear wt ?  Pt was incont of urine Pt was cleaned up and clean panties placed  urine is very strong smelling  states she has not eaten much since the fall ?

## 2021-05-12 DIAGNOSIS — N179 Acute kidney failure, unspecified: Secondary | ICD-10-CM | POA: Diagnosis not present

## 2021-05-12 LAB — CBC
HCT: 29.2 % — ABNORMAL LOW (ref 36.0–46.0)
Hemoglobin: 9.4 g/dL — ABNORMAL LOW (ref 12.0–15.0)
MCH: 29.4 pg (ref 26.0–34.0)
MCHC: 32.2 g/dL (ref 30.0–36.0)
MCV: 91.3 fL (ref 80.0–100.0)
Platelets: 293 10*3/uL (ref 150–400)
RBC: 3.2 MIL/uL — ABNORMAL LOW (ref 3.87–5.11)
RDW: 15.1 % (ref 11.5–15.5)
WBC: 7.3 10*3/uL (ref 4.0–10.5)
nRBC: 0 % (ref 0.0–0.2)

## 2021-05-12 LAB — BASIC METABOLIC PANEL
Anion gap: 8 (ref 5–15)
BUN: 42 mg/dL — ABNORMAL HIGH (ref 8–23)
CO2: 17 mmol/L — ABNORMAL LOW (ref 22–32)
Calcium: 9.4 mg/dL (ref 8.9–10.3)
Chloride: 111 mmol/L (ref 98–111)
Creatinine, Ser: 2.96 mg/dL — ABNORMAL HIGH (ref 0.44–1.00)
GFR, Estimated: 16 mL/min — ABNORMAL LOW (ref >60)
Glucose, Bld: 82 mg/dL (ref 70–99)
Potassium: 5.1 mmol/L (ref 3.5–5.1)
Sodium: 136 mmol/L (ref 135–145)

## 2021-05-12 MED ORDER — SODIUM CHLORIDE 0.45 % IV SOLN
INTRAVENOUS | Status: DC
  Filled 2021-05-12 (×5): qty 75

## 2021-05-12 NOTE — Evaluation (Signed)
Physical Therapy Evaluation ?Patient Details ?Name: Danielle Zamora ?MRN: [id number] ?DOB: 12/14/1945 ?Today's Date: 05/12/2021 ? ?History of Present Illness ? Danielle Zamora is a 76 y.o. female with a past history of hypertension, CKD, vitamin D deficiency with pernicious anemia who comes the ED complaining of a trip and fall yesterday resulting in right upper leg pain. ?  ?Clinical Impression ? Pt admitted with above diagnosis. Pt received upright in bed agreeable to PT services. Pt reporting home lay out, DME, PLOF without difficulty. At baseline pt reports she is typically mod-I with ambulation in home with [facility], does some driving and short community errands. Relies on brother intermittently for meals.  ? ?To date, pt relying increased time, modA and HOB maximally elevated to transfer to sitting EOB. To stand pt relying on bed elevated with multiple bouts of momentum and modA to stand to RW with max VC's for correct form/technique and hand placement. Noted BM on chuck pad with maxA needed from NT to assist in perihygiene while PT guarded pt. X2 additional STS performed for LE strengthening along with seated therex. On last STS, x3 steps to R towards [facility] performed with minA at [facility] for sequencing. Throughout stand to sit, poor eccentric control noted with poor use of hands. Pt left seated at EOB eating breakfast with NT in room with all needs in reach. Pt displaying deficits in LE strength/endurance and poor use of AD placing pt at high risk for falls. Pt lives alone with limited support at home thus would benefit from STR at d/c to improve independence and safety with OOB mobility prior to transitioning to home environment. Pt currently with functional limitations due to the deficits listed below (see PT Problem List). Pt will benefit from skilled PT to increase their independence and safety with mobility to allow discharge to the venue listed below.      ?   ? ?Recommendations for follow up therapy are one component of a  multi-disciplinary discharge planning process, led by the attending physician.  Recommendations may be updated based on patient status, additional functional criteria and insurance authorization. ? ?Follow Up Recommendations Skilled nursing-short term rehab (<3 hours/day) ? ?  ?Assistance Recommended at Discharge Intermittent Supervision/Assistance  ?Patient can return home with the following ? Assistance with cooking/housework;A lot of help with bathing/dressing/bathroom;A lot of help with walking and/or transfers;Help with stairs or ramp for entrance;Assist for transportation ? ?  ?Equipment Recommendations Other (comment) (tbd by next venue of care)  ?Recommendations for Other Services ?    ?  ?Functional Status Assessment Patient has had a recent decline in their functional status and demonstrates the ability to make significant improvements in function in a reasonable and predictable amount of time.  ? ?  ?Precautions / Restrictions Precautions ?Precautions: Fall ?Restrictions ?Weight Bearing Restrictions: No  ? ?  ? ?Mobility ? Bed Mobility ?Overal bed mobility: Needs Assistance ?Bed Mobility: Supine to Sit ?  ?  ?Supine to sit: Mod assist, HOB elevated ?  ?  ?  ?Patient Response: Cooperative ? ?Transfers ?Overall transfer level: Needs assistance ?Equipment used: Rolling walker (2 wheels) ?Transfers: Sit to/from Stand ?Sit to Stand: Mod assist, From elevated surface ?  ?  ?  ?  ?  ?General transfer comment: Pt requiring bouts of momentum and bed highly elevated with BUE support to successfully stand ?  ? ?Ambulation/Gait ?  ?  ?  ?  ?  ?  ?  ?General Gait Details: unable at this time ? ?Stairs ?  ?  ?  ?  ?  ? ?  Wheelchair Mobility ?  ? ?Modified Rankin (Stroke Patients Only) ?  ? ?  ? ?Balance Overall balance assessment: Needs assistance ?Sitting-balance support: Feet supported, Bilateral upper extremity supported ?Sitting balance-Leahy Scale: Fair ?  ?  ?Standing balance support: Bilateral upper extremity  supported, During functional activity, Reliant on assistive device for balance ?Standing balance-Leahy Scale: Poor ?Standing balance comment: heavy UE reliance and anterior trunk lean on AD to successfully maintain balance ?  ?  ?  ?  ?  ?  ?  ?  ?  ?  ?  ?   ? ? ? ?Pertinent Vitals/Pain Pain Assessment ?Pain Assessment: No/denies pain  ? ? ?Home Living Family/patient expects to be discharged to:: Private residence ?Living Arrangements: Alone ?Available Help at Discharge: Family;Available PRN/intermittently (pt's brother) ?Type of Home: House ?Home Access: Stairs to enter ?  ?Entrance Stairs-Number of Steps: 3-4 ?  ?Home Layout: One level ?Home Equipment: Rollator (4 wheels);Cane - single point;Shower seat ?   ?  ?Prior Function Prior Level of Function : Independent/Modified Independent;Needs assist ?  ?  ?  ?Physical Assist : ADLs (physical) ?  ?  ?  ?ADLs Comments: Relies on brother for bringing meals on occasion ?  ? ? ?Hand Dominance  ?   ? ?  ?Extremity/Trunk Assessment  ? Upper Extremity Assessment ?Upper Extremity Assessment: Generalized weakness ?  ? ?Lower Extremity Assessment ?Lower Extremity Assessment: Generalized weakness ?  ? ?Cervical / Trunk Assessment ?Cervical / Trunk Assessment: Normal  ?Communication  ? Communication: No difficulties  ?Cognition Arousal/Alertness: Awake/alert ?Behavior During Therapy: [facility] for tasks assessed/performed ?Overall Cognitive Status: Within Functional Limits for tasks assessed ?  ?  ?  ?  ?  ?  ?  ?  ?  ?  ?  ?  ?  ?  ?  ?  ?  ?  ?  ? ?  ?General Comments   ? ?  ?Exercises General Exercises - Lower Extremity ?Long Arc Quad: AROM, Strengthening, Both, 10 reps, Seated ?Hip Flexion/Marching: AROM, Strengthening, Both, 10 reps, Seated ?Other Exercises ?Other Exercises: Role of PT in acute setting, d/c recs, safe use of AD  ? ?Assessment/Plan  ?  ?PT Assessment Patient needs continued PT services  ?PT Problem List Decreased strength;Decreased mobility;Decreased safety  awareness;Decreased activity tolerance;Decreased balance ? ?   ?  ?PT Treatment Interventions DME instruction;Therapeutic exercise;Gait training;Balance training;Stair training;Neuromuscular re-education;Functional mobility training;Therapeutic activities;Patient/family education   ? ?PT Goals (Current goals can be found in the Care Plan section)  ?Acute Rehab PT Goals ?Patient Stated Goal: to get stronger ?PT Goal Formulation: With patient ?Time For Goal Achievement: 05/26/21 ?Potential to Achieve Goals: Good ? ?  ?Frequency Min 2X/week ?  ? ? ?Co-evaluation   ?  ?  ?  ?  ? ? ?  ?AM-PAC PT "6 Clicks" Mobility  ?Outcome Measure Help needed turning from your back to your side while in a flat bed without using bedrails?: A Little ?Help needed moving from lying on your back to sitting on the side of a flat bed without using bedrails?: A Lot ?Help needed moving to and from a bed to a chair (including a wheelchair)?: A Lot ?Help needed standing up from a chair using your arms (e.g., wheelchair or bedside chair)?: A Lot ?Help needed to walk in hospital room?: Total ?Help needed climbing 3-5 steps with a railing? : Total ?6 Click Score: 11 ? ?  ?End of Session Equipment Utilized During Treatment: Gait belt ?Activity Tolerance:  Patient limited by fatigue ?Patient left: in bed;with call bell/phone within reach;with bed alarm set;with nursing/sitter in room ?Nurse Communication: Mobility status ?PT Visit Diagnosis: Unsteadiness on feet (R26.81);Other abnormalities of gait and mobility (R26.89);Muscle weakness (generalized) (M62.81);History of falling (Z91.81);Difficulty in walking, not elsewhere classified (R26.2) ?  ? ?Time: [phone] ?PT Time Calculation (min) (ACUTE ONLY): 30 min ? ? ?Charges:   PT Evaluation ?$PT Eval Moderate Complexity: 1 Mod ?PT Treatments ?$Therapeutic Activity: 8-22 mins ?  ?   ? ? ?Salem Caster. Fairly IV, PT, DPT ?Physical Therapist- [place]  ?[facility]  ?05/12/2021, 10:12  AM ? ?

## 2021-05-12 NOTE — [place] FL2 (Signed)
?[place] MEDICAID FL2 LEVEL OF CARE SCREENING TOOL  ?  ? ?IDENTIFICATION  ?Patient Name: ?Danielle Zamora Birthdate: 1945/01/19 Sex: female Admission Date (Current Location): ?05/11/2021  ?[place] and [place] Number: ? [place] ?  Facility and Address:  ?[facility], [address], [place], [place] [address] ?     Provider Number: ?[id number]  ?Attending Physician Name and Address:  ?Fritzi Mandes, MD ? Relative Name and Phone Number:  ?  ?   ?Current Level of Care: ?Hospital Recommended Level of Care: ?[place] Prior Approval Number: ?  ? ?Date Approved/Denied: ?  PASRR Number: ?[id number] A ? ?Discharge Plan: ?SNF ?  ? ?Current Diagnoses: ?Patient Active Problem List  ? Diagnosis Date Noted  ? AKI (acute kidney injury) ([place]) 05/11/2021  ? Anemia due to chronic kidney disease 05/11/2021  ? Fall 05/11/2021  ? Hyperkalemia 05/11/2021  ? Aortic atherosclerosis ([place]) 02/24/2021  ? 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 Hyperparathyroidism due to renal insufficiency ([place]) 06/25/2020  ? Stage 3b chronic kidney disease ([place]) 06/25/2020  ? Unspecified abnormal findings in urine 06/25/2020  ? Gout 11/25/2019  ? Hyperlipidemia 11/25/2019  ? Osteoporosis 01/21/2019  ? Vitamin D deficiency 01/21/2019  ? Tobacco abuse 01/21/2019  ? Prediabetes 01/21/2019  ? CKD (chronic kidney disease) stage 4, GFR 15-29 ml/min (HCC) 10/09/2017  ? Hyperparathyroidism ([place]) 10/09/2017  ? Essential hypertension 08/31/2016  ? Pernicious anemia 08/11/2016  ? ? ?Orientation RESPIRATION BLADDER Height & Weight   ?  ?Self, Time, Situation, Place ? Normal Continent Weight: 195 lb (88.5 kg) ?Height:  '5\' 3"'$  (160 cm)  ?BEHAVIORAL SYMPTOMS/MOOD NEUROLOGICAL BOWEL NUTRITION STATUS  ?    Continent Diet (2 gram sodium)  ?AMBULATORY STATUS COMMUNICATION OF NEEDS Skin   ?Limited Assist Verbally Normal ?  ?  ?  ?    ?     ?     ? ? ?Personal Care Assistance Level of Assistance  ?Dressing, Feeding, Bathing Bathing Assistance: Limited  assistance ?Feeding assistance: Independent ?Dressing Assistance: Limited assistance ?   ? ?Functional Limitations Info  ?Sight, Hearing, Speech Sight Info: Adequate ?Hearing Info: Adequate ?Speech Info: Adequate  ? ? ?SPECIAL CARE FACTORS FREQUENCY  ?PT (By licensed PT), OT (By licensed OT)   ?  ?PT Frequency: 5x ?OT Frequency: 5x ?  ?  ?  ?   ? ? ?Contractures Contractures Info: Not present  ? ? ?Additional Factors Info  ?Code Status, Allergies Code Status Info: full code ?Allergies Info: penicillins ?  ?  ?  ?   ? ?Current Medications (05/12/2021):  This is the current hospital active medication list ?Current Facility-Administered Medications  ?Medication Dose Route Frequency Provider Last Rate Last Admin  ? acetaminophen (TYLENOL) tablet 650 mg  650 mg Oral Q6H PRN Agbata, Tochukwu, MD      ? Or  ? acetaminophen (TYLENOL) suppository 650 mg  650 mg Rectal Q6H PRN Agbata, Tochukwu, MD      ? atorvastatin (LIPITOR) tablet 40 mg  40 mg Oral QPM Agbata, Tochukwu, MD   40 mg at 05/11/21 1710  ? enoxaparin (LOVENOX) injection 30 mg  30 mg Subcutaneous Q24H Agbata, Tochukwu, MD   30 mg at 05/11/21 1710  ? nicotine (NICODERM CQ - dosed in mg/24 hours) patch 14 mg  14 mg Transdermal Daily Agbata, Tochukwu, MD   14 mg at 05/12/21 1055  ? ondansetron (ZOFRAN) tablet 4 mg  4 mg Oral Q6H PRN Collier Bullock, MD      ?  Or  ? ondansetron (ZOFRAN) injection 4 mg  4 mg Intravenous Q6H PRN Agbata, Tochukwu, MD      ? patiromer Daryll Drown) packet 16.8 g  16.8 g Oral Daily Carrie Mew, MD   16.8 g at 05/12/21 1313  ? raloxifene (EVISTA) tablet 60 mg  60 mg Oral Daily Agbata, Tochukwu, MD   60 mg at 05/12/21 1055  ? sodium bicarbonate 75 mEq in sodium chloride 0.45 % 1,075 mL infusion   Intravenous Continuous Fritzi Mandes, MD      ? ? ? ?Discharge Medications: ?Please see discharge summary for a list of discharge medications. ? ?Relevant Imaging Results: ? ?Relevant Lab Results: ? ? ?Additional  Information ?UEB:[id number] ? ?Kylah Maresh A Jazen Spraggins, LCSW ? ? ? ? ?

## 2021-05-12 NOTE — Progress Notes (Signed)
[place] at [facility] ? ? ?PATIENT NAME: Danielle Zamora   ? ?MR#:  [id number] ? ?DATE OF BIRTH:  1945/01/24 ? ?SUBJECTIVE:  ? ?patient came in with generalized weakness and difficulty ambulating climbing stairs poor appetite and poor PO intake. ? ?Currently sitting in chairs. Requires assistance from staff. Feels overall week. No family at bedside. Tells me she is trying to eat as much as she can. ?VITALS:  ?Blood pressure 97/62, pulse 82, temperature 100.1 ?F (37.8 ?C), temperature source Oral, resp. rate 16, height '5\' 3"'$  (1.6 m), weight 88.5 kg, SpO2 99 %. ? ?PHYSICAL EXAMINATION:  ? ?GENERAL:  76 y.o.-year-old patient lying in the bed with no acute distress.  ?LUNGS: Normal breath sounds bilaterally, no wheezing, rales, rhonchi.  ?CARDIOVASCULAR: S1, S2 normal. No murmurs, rubs, or gallops.  ?ABDOMEN: Soft, nontender, nondistended. Bowel sounds present.  ?EXTREMITIES: No  edema b/l.    ?NEUROLOGIC: nonfocal  patient is alert and awake ?SKIN: No obvious rash, lesion, or ulcer.  ? ?LABORATORY PANEL:  ?CBC ?Recent Labs  ?Lab 05/12/21 ?0402  ?WBC 7.3  ?HGB 9.4*  ?HCT 29.2*  ?PLT 293  ? ? ?Chemistries  ?Recent Labs  ?Lab 05/12/21 ?0402  ?NA 136  ?K 5.1  ?CL 111  ?CO2 17*  ?GLUCOSE 82  ?BUN 42*  ?CREATININE 2.96*  ?CALCIUM 9.4  ? ?Cardiac Enzymes ?No results for input(s): TROPONINI in the last 168 hours. ?RADIOLOGY:  ?DG Pelvis 1-2 Views ? ?Result Date: 05/11/2021 ?CLINICAL DATA:  [place] yesterday. EXAM: PELVIS - 1-2 VIEW COMPARISON:  None Available. FINDINGS: There is no evidence of pelvic fracture or diastasis. No pelvic bone lesions are seen. Degenerative changes of both hips. 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 IMPRESSION: 1. No acute osseous abnormality. Electronically Signed   By: Titus Dubin M.D.   On: 05/11/2021 12:38  ? ?DG Femur Min 2 Views Right ? ?Result Date: 05/11/2021 ?CLINICAL DATA:  Right leg pain after fall yesterday. EXAM: RIGHT FEMUR 2 VIEWS COMPARISON:  None Available.  FINDINGS: No acute fracture or dislocation. Degenerative changes of the right hip and knee. Soft tissues are unremarkable. IMPRESSION: 1. No acute osseous abnormality. Electronically Signed   By: Titus Dubin M.D.   On: 05/11/2021 12:36   ? ?Assessment and Plan ? Danielle Zamora is a 76 y.o. female with medical history significant for hypertension, pernicious anemia, nicotine and marijuana use who presents to the ER for evaluation after she had a mechanical fall the day prior to her admission. ?She states that she has not felt well for over a week and has had decreased sense of smell, decreased taste, poor appetite, nausea and 1 episode of emesis. ? ?Acute kidney injury on chronic kidney injury stage IIIB ?prerenal azotemia ?poor PO intake ?acidosis ?-- hold Avapro and Lasix ?-- IV fluids with bicarb ?-- renal ultrasound ?-- patient follows with nephrology Dr Joylene John as outpatient. Baseline creatinine around 1.3--1.4 ( Feb 2023) ? ?Fall/generalized weakness ?-- PT evaluation appreciated-- recommends rehab ? ?essential hypertension ?-- BP soft ?-- hold Avapro, amlodipine, metoprolol ? ?tobacco abuse ?-- advised cessation ? ?anemia of chronic disease/CKD ?-- hemoglobin stable at 10 ?-- iron studies normal ? ?history of chronic hypercalcemia with hyperparathyroidism ?parathyroid adenoma ?-- patient has had evaluation with endocrinology at [facility]. ?-She is recommended for surgical evaluation as outpatient for parathyroid surgery. ? ? ? ? ?Procedures: ?Family communication :none today ?Consults :none ?CODE STATUS: full ?DVT Prophylaxis :enoxaparin ?Level of care: Telemetry Medical ?Status is: Inpatient ?  Remains inpatient appropriate because: acute renal failure on underlying chronic kidney disease... Getting IV fluids ?TOC for discharge planning to rehab ?  ? ?TOTAL TIME TAKING CARE OF THIS PATIENT: 30 minutes.  ?>50% time spent on counselling and coordination of care ? ?Note: This dictation was prepared with Dragon  dictation along with smaller phrase technology. Any transcriptional errors that result from this process are unintentional. ? ?Fritzi Mandes M.D  ? ? ?[facility]  ? ?CC: ?Primary care physician; Pleas Koch, NP  ?

## 2021-05-12 NOTE — TOC Initial Note (Signed)
Transition of Care (TOC) - Initial/Assessment Note  ? ? ?Patient Details  ?Name: Danielle Zamora ?MRN: [id number] ?Date of Birth: 18-Jul-1945 ? ?Transition of Care (TOC) CM/SW Contact:    ?Natasa Stigall A Shirley Bolle, LCSW ?Phone Number: ?05/12/2021, 1:51 PM ? ?Clinical Narrative:  CSW spoke with pt and pt is agreeable to SNF. Pt would prefer [facility] or [facility]. Referral has been sent.                ? ? ?Expected Discharge Plan: [place] ?Barriers to Discharge: Continued Medical Work up ? ? ?Patient Goals and CMS Choice ?Patient states their goals for this hospitalization and ongoing recovery are:: to get better ?  ?Choice offered to / list presented to : Patient ? ?Expected Discharge Plan and Services ?Expected Discharge Plan: [place] ?In-house Referral: Clinical Social Work ?  ?Post Acute Care Choice: [place] ?Living arrangements for the past 2 months: [place] ?                ?  ?  ?  ?  ?  ?  ?  ?  ?  ?  ? ?Prior Living Arrangements/Services ?Living arrangements for the past 2 months: [place] ?Lives with:: Self ?Patient language and need for interpreter reviewed:: Yes ?Do you feel safe going back to the place where you live?: Yes      ?Need for Family Participation in Patient Care: Yes (Comment) ?Care giver support system in place?: Yes (comment) ?  ?Criminal Activity/Legal Involvement Pertinent to Current Situation/Hospitalization: No - Comment as needed ? ?Activities of Daily Living ?Home Assistive Devices/Equipment: Gilford Rile (specify type), Cane (specify quad or straight) ?ADL Screening (condition at time of admission) ?Patient's cognitive ability adequate to safely complete daily activities?: Yes ?Is the patient deaf or have difficulty hearing?: No ?Does the patient have difficulty seeing, even when wearing glasses/contacts?: No ?Does the patient have difficulty concentrating, remembering, or making decisions?: No ?Patient able to express need for  assistance with ADLs?: Yes ?Does the patient have difficulty dressing or bathing?: Yes ?Independently performs ADLs?: Yes (appropriate for developmental age) ?Does the patient have difficulty walking or climbing stairs?: Yes ?Weakness of Legs: Both ?Weakness of Arms/Hands: Both ? ?Permission Sought/Granted ?Permission sought to share information with : Family Supports ?Permission granted to share information with : Yes, Release of Information Signed ? Share Information with NAME: [name] ?   ? Permission granted to share info w Relationship: brother ?   ? ?Emotional Assessment ?Appearance:: Appears stated age ?Attitude/Demeanor/Rapport: Engaged ?Affect (typically observed): Appropriate ?Orientation: : Oriented to Place, Oriented to Self, Oriented to  Time, Oriented to Situation ?Alcohol / Substance Use: Not Applicable ?Psych Involvement: No (comment) ? ?Admission diagnosis:  Generalized weakness [R53.1] ?AKI (acute kidney injury) ([place]) [N17.9] ?Fall in home, initial encounter [W19.XXXA, Y92.009] ?Patient Active Problem List  ? Diagnosis Date Noted  ? AKI (acute kidney injury) ([place]) 05/11/2021  ? Anemia due to chronic kidney disease 05/11/2021  ? Fall 05/11/2021  ? Hyperkalemia 05/11/2021  ? Aortic atherosclerosis ([place]) 02/24/2021  ? 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 Hyperparathyroidism due to renal insufficiency ([place]) 06/25/2020  ? Stage 3b chronic kidney disease ([place]) 06/25/2020  ? Unspecified abnormal findings in urine 06/25/2020  ? Gout 11/25/2019  ? Hyperlipidemia 11/25/2019  ? Osteoporosis 01/21/2019  ? Vitamin D deficiency 01/21/2019  ? Tobacco abuse 01/21/2019  ? Prediabetes 01/21/2019  ? CKD (chronic kidney disease) stage 4, GFR 15-29 ml/min (HCC) 10/09/2017  ?  Hyperparathyroidism ([place]) 10/09/2017  ? Essential hypertension 08/31/2016  ? Pernicious anemia 08/11/2016  ? ?PCP:  Pleas Koch, NP ?Pharmacy:   ?[facility], [address] ?[place] ?[place] [address] ?Phone: [phone] Fax: [phone] ? ? ? ? ?Social Determinants of Health (SDOH) Interventions ?  ? ?Readmission Risk Interventions ?   ? View : No data to display.  ?  ?  ?  ? ? ? ?

## 2021-05-12 NOTE — Telephone Encounter (Signed)
Noted. ?Patient currently admitted to [facility].  ?

## 2021-05-13 DIAGNOSIS — N179 Acute kidney failure, unspecified: Secondary | ICD-10-CM | POA: Diagnosis not present

## 2021-05-13 LAB — URINE CULTURE: Culture: <10000 — AB

## 2021-05-13 LAB — BASIC METABOLIC PANEL
Anion gap: 7 (ref 5–15)
BUN: 33 mg/dL — ABNORMAL HIGH (ref 8–23)
CO2: 20 mmol/L — ABNORMAL LOW (ref 22–32)
Calcium: 9.1 mg/dL (ref 8.9–10.3)
Chloride: 107 mmol/L (ref 98–111)
Creatinine, Ser: 2.54 mg/dL — ABNORMAL HIGH (ref 0.44–1.00)
GFR, Estimated: 19 mL/min — ABNORMAL LOW (ref >60)
Glucose, Bld: 116 mg/dL — ABNORMAL HIGH (ref 70–99)
Potassium: 4.6 mmol/L (ref 3.5–5.1)
Sodium: 134 mmol/L — ABNORMAL LOW (ref 135–145)

## 2021-05-13 NOTE — Progress Notes (Signed)
Physical Therapy Treatment ?Patient Details ?Name: Danielle Zamora ?MRN: [id number] ?DOB: 06/19/1945 ?Today's Date: 05/13/2021 ? ? ?History of Present Illness Danielle Zamora is a 76 y.o. female with a past history of hypertension, CKD, vitamin D deficiency with pernicious anemia who comes the ED complaining of a trip and fall yesterday resulting in right upper leg pain. ? ?  ?PT Comments  ? ? Patient alert, agreeable to therapy with encouragement. Denied pain, but did report L toe pain due to suspected gout when touched. Supine to sit with reliance on bed rails, extra time, CGA. Sit <> Stand with RW and modA, attempted twice prior to succeeding into coming up into standing. She was able to take several small, shuffled steps to recliner at bedside with minA, very effortful for pt. Positioned in chair in preparation for breakfast. The patient would benefit from further skilled PT intervention to continue to progress towards goals. Recommendation remains appropriate.  ?   ?Recommendations for follow up therapy are one component of a multi-disciplinary discharge planning process, led by the attending physician.  Recommendations may be updated based on patient status, additional functional criteria and insurance authorization. ? ?Follow Up Recommendations ? Skilled nursing-short term rehab (<3 hours/day) ?  ?  ?Assistance Recommended at Discharge Intermittent Supervision/Assistance  ?Patient can return home with the following Assistance with cooking/housework;A lot of help with bathing/dressing/bathroom;A lot of help with walking and/or transfers;Help with stairs or ramp for entrance;Assist for transportation ?  ?Equipment Recommendations ? Other (comment) (tbd by next venue of care)  ?  ?Recommendations for Other Services   ? ? ?  ?Precautions / Restrictions Precautions ?Precautions: Fall ?Restrictions ?Weight Bearing Restrictions: No  ?  ? ?Mobility ? Bed Mobility ?Overal bed mobility: Needs Assistance ?Bed Mobility: Supine  to Sit ?  ?  ?Supine to sit: HOB elevated, Min guard ?  ?  ?General bed mobility comments: extended time, reliant on bed rails ?  ? ?Transfers ?Overall transfer level: Needs assistance ?Equipment used: Rolling walker (2 wheels) ?Transfers: Sit to/from Stand ?Sit to Stand: Mod assist, From elevated surface ?  ?  ?  ?  ?  ?General transfer comment: bouts of momentum, two tries to come up into standing successfully ?  ? ?Ambulation/Gait ?Ambulation/Gait assistance: Min assist ?Gait Distance (Feet): 1 Feet ?Assistive device: Rolling walker (2 wheels) ?  ?  ?  ?  ?General Gait Details: able to take a few steps in room to transfer to recliner. very small, shuffled steps, effortful for pt ? ? ?Stairs ?  ?  ?  ?  ?  ? ? ?Wheelchair Mobility ?  ? ?Modified Rankin (Stroke Patients Only) ?  ? ? ?  ?Balance Overall balance assessment: Needs assistance ?Sitting-balance support: Feet supported, Bilateral upper extremity supported ?Sitting balance-Leahy Scale: Fair ?  ?  ?Standing balance support: Bilateral upper extremity supported, During functional activity, Reliant on assistive device for balance ?Standing balance-Leahy Scale: Poor ?Standing balance comment: heavy UE reliance and anterior trunk lean on AD to successfully maintain balance ?  ?  ?  ?  ?  ?  ?  ?  ?  ?  ?  ?  ? ?  ?Cognition Arousal/Alertness: Awake/alert ?Behavior During Therapy: [facility] for tasks assessed/performed ?Overall Cognitive Status: Within Functional Limits for tasks assessed ?  ?  ?  ?  ?  ?  ?  ?  ?  ?  ?  ?  ?  ?  ?  ?  ?  ?  ?  ? ?  ?  Exercises   ? ?  ?General Comments   ?  ?  ? ?Pertinent Vitals/Pain Pain Assessment ?Pain Assessment: No/denies pain  ? ? ?Home Living   ?  ?  ?  ?  ?  ?  ?  ?  ?  ?   ?  ?Prior Function    ?  ?  ?   ? ?PT Goals (current goals can now be found in the care plan section) Progress towards PT goals: Progressing toward goals ? ?  ?Frequency ? ? ? Min 2X/week ? ? ? ?  ?PT Plan Current plan remains appropriate  ? ? ?Co-evaluation    ?  ?  ?  ?  ? ?  ?AM-PAC PT "6 Clicks" Mobility   ?Outcome Measure ? Help needed turning from your back to your side while in a flat bed without using bedrails?: A Little ?Help needed moving from lying on your back to sitting on the side of a flat bed without using bedrails?: A Lot ?Help needed moving to and from a bed to a chair (including a wheelchair)?: A Lot ?Help needed standing up from a chair using your arms (e.g., wheelchair or bedside chair)?: A Lot ?Help needed to walk in hospital room?: Total ?Help needed climbing 3-5 steps with a railing? : Total ?6 Click Score: 11 ? ?  ?End of Session Equipment Utilized During Treatment: Gait belt ?Activity Tolerance: Patient tolerated treatment well ?Patient left: with call bell/phone within reach;with chair alarm set;in chair ?Nurse Communication: Mobility status ?PT Visit Diagnosis: Unsteadiness on feet (R26.81);Other abnormalities of gait and mobility (R26.89);Muscle weakness (generalized) (M62.81);History of falling (Z91.81);Difficulty in walking, not elsewhere classified (R26.2) ?  ? ? ?Time: 8032-1224 ?PT Time Calculation (min) (ACUTE ONLY): 19 min ? ?Charges:  $Therapeutic Activity: 8-22 mins          ?          ?Lieutenant Diego PT, DPT ?10:41 AM,05/13/21 ? ? ?

## 2021-05-13 NOTE — Plan of Care (Signed)

## 2021-05-13 NOTE — TOC Progression Note (Signed)
Transition of Care (TOC) - Progression Note  ? ? ?Patient Details  ?Name: Danielle Zamora ?MRN: [id number] ?Date of Birth: 1945-02-08 ? ?Transition of Care (TOC) CM/SW Contact  ?Muscab Brenneman A Brindley Madarang, LCSW ?Phone Number: ?05/13/2021, 1:39 PM ? ?Clinical Narrative:  Pt notified that [facility] declined but [facility] did accept pt. Pt is agreeable to [facility]. Pt ask that CSW contact her son  [name] and discuss he plan as well. CSW spoke with pt's son and he is updated and aware of potential dc tomorrow to [facility].  ? ? ? ?Expected Discharge Plan: [place] ?Barriers to Discharge: Continued Medical Work up ? ?Expected Discharge Plan and Services ?Expected Discharge Plan: [place] ?In-house Referral: Clinical Social Work ?  ?Post Acute Care Choice: [place] ?Living arrangements for the past 2 months: [place] ?                ?  ?  ?  ?  ?  ?  ?  ?  ?  ?  ? ? ?Social Determinants of Health (SDOH) Interventions ?  ? ?Readmission Risk Interventions ?   ? View : No data to display.  ?  ?  ?  ? ? ?

## 2021-05-13 NOTE — Progress Notes (Signed)
[place] at [facility] ? ? ?PATIENT NAME: Danielle Zamora   ? ?MR#:  [id number] ? ?DATE OF BIRTH:  [birth year] ? ?SUBJECTIVE:  ? ?patient came in with generalized weakness and difficulty ambulating climbing stairs poor appetite and poor PO intake. ? ?Currently sitting in chair. Eating BF ?VITALS:  ?Blood pressure 106/68, pulse 80, temperature 98.5 ?F (36.9 ?C), temperature source Oral, resp. rate 18, height '5\' 3"'$  (1.6 m), weight 88.5 kg, SpO2 100 %. ? ?PHYSICAL EXAMINATION:  ? ?GENERAL:  76 y.o.-year-old patient lying in the bed with no acute distress.  ?LUNGS: Normal breath sounds bilaterally, no wheezing, rales, rhonchi.  ?CARDIOVASCULAR: S1, S2 normal. No murmurs, rubs, or gallops.  ?ABDOMEN: Soft, nontender, nondistended.  ?EXTREMITIES: No  edema b/l.    ?NEUROLOGIC: nonfocal  patient is alert and awake ? ?LABORATORY PANEL:  ?CBC ?Recent Labs  ?Lab 05/12/21 ?0402  ?WBC 7.3  ?HGB 9.4*  ?HCT 29.2*  ?PLT 293  ? ? ? ?Chemistries  ?Recent Labs  ?Lab 05/13/21 ?1204  ?NA 134*  ?K 4.6  ?CL 107  ?CO2 20*  ?GLUCOSE 116*  ?BUN 33*  ?CREATININE 2.54*  ?CALCIUM 9.1  ? ? ?Cardiac Enzymes ?No results for input(s): TROPONINI in the last 168 hours. ?RADIOLOGY:  ?No results found. ? ?Assessment and Plan ? Danielle Zamora is a 76 y.o. female with medical history significant for hypertension, pernicious anemia, nicotine and marijuana use who presents to the ER for evaluation after she had a mechanical fall the day prior to her admission. ?She states that she has not felt well for over a week and has had decreased sense of smell, decreased taste, poor appetite, nausea and 1 episode of emesis. ? ?Acute kidney injury on chronic kidney injury stage IIIB ?prerenal azotemia ?poor PO intake ?acidosis ?-- hold Avapro and Lasix ?-- IV fluids with bicarb ?-- renal ultrasound ?-- patient follows with nephrology Dr Joylene John as outpatient. Baseline creatinine around 1.3--1.4 ( Feb 2023) ?--creat 3.9--2.5 ?--K  4.6 ? ?Fall/generalized weakness ?-- PT evaluation appreciated-- recommends rehab ? ?essential hypertension ?-- BP soft ?-- hold Avapro, amlodipine, metoprolol ? ?tobacco abuse ?-- advised cessation ? ?anemia of chronic disease/CKD ?-- hemoglobin stable at 10 ?-- iron studies normal ? ?history of chronic hypercalcemia with hyperparathyroidism ?parathyroid adenoma ?-- patient has had evaluation with endocrinology at [facility]. ?-She is recommended for surgical evaluation as outpatient for parathyroid surgery. ? ? ? ? ?Procedures: ?Family communication :Son Freada [place] ?Consults :none ?CODE STATUS: full ?DVT Prophylaxis :enoxaparin ?Level of care: Telemetry Medical ?Status is: Inpatient ?Remains inpatient appropriate because: acute renal failure on underlying chronic kidney disease... Getting IV fluids ?TOC for discharge planning to rehab ?  ? ?TOTAL TIME TAKING CARE OF THIS PATIENT: 30 minutes.  ?>50% time spent on counselling and coordination of care ? ?Note: This dictation was prepared with Dragon dictation along with smaller phrase technology. Any transcriptional errors that result from this process are unintentional. ? ?Danielle Zamora M.D  ? ? ?[facility]  ? ?CC: ?Primary care physician; Pleas Koch, NP  ?

## 2021-05-14 DIAGNOSIS — N179 Acute kidney failure, unspecified: Secondary | ICD-10-CM | POA: Diagnosis not present

## 2021-05-14 LAB — BASIC METABOLIC PANEL
Anion gap: 6 (ref 5–15)
BUN: 28 mg/dL — ABNORMAL HIGH (ref 8–23)
CO2: 21 mmol/L — ABNORMAL LOW (ref 22–32)
Calcium: 8.8 mg/dL — ABNORMAL LOW (ref 8.9–10.3)
Chloride: 109 mmol/L (ref 98–111)
Creatinine, Ser: 2.18 mg/dL — ABNORMAL HIGH (ref 0.44–1.00)
GFR, Estimated: 23 mL/min — ABNORMAL LOW (ref >60)
Glucose, Bld: 68 mg/dL — ABNORMAL LOW (ref 70–99)
Potassium: 4.3 mmol/L (ref 3.5–5.1)
Sodium: 136 mmol/L (ref 135–145)

## 2021-05-14 MED ORDER — NICOTINE 14 MG/24HR TD PT24: 14.0000 mg | MEDICATED_PATCH | Freq: Every day | TRANSDERMAL | 0 refills | Status: DC

## 2021-05-14 NOTE — Plan of Care (Addendum)
Patient discharged per MD orders at this time.All discharge instructions, education and medications reviewed with the patient.Patient expressed understanding and will follow dc instructions.follow up appointments was also communicated to the patient.no verbal c/o or any ssx of distress.Pt was [facility] to the [facility] for [facility]/OT services per order.report was called to staff nurse Ms Theone Murdoch before transport.Pt was transported was by two [place] [facility] and ambulance rescue services personnel on a stretcher. ?

## 2021-05-14 NOTE — Discharge Instructions (Addendum)
Keep log of BP at the facility and MD/NP to initiate BP meds accordingly ?

## 2021-05-14 NOTE — Discharge Summary (Signed)
?Physician Discharge Summary ?  ?Patient: Danielle Zamora MRN: [id number] DOB: Sep 10, 1945  ?Admit date:     05/11/2021  ?Discharge date: 05/14/21  ?Discharge Physician: Fritzi Mandes  ? ?PCP: Pleas Koch, NP  ? ?Recommendations at discharge:  ? ?keep log of blood pressure at the facility and MD/nurse practitioner to initiate BP meds accordingly ? ?Discharge Diagnoses: ? ?Acute on chronic kidney disease stage III B/prerenal azotemia/poor PO intake ? ?Hospital Course: ? ? Danielle Zamora is a 76 y.o. female with medical history significant for hypertension, pernicious anemia, nicotine and marijuana use who presents to the ER for evaluation after she had a mechanical fall the day prior to her admission. ?She states that she has not felt well for over a week and has had decreased sense of smell, decreased taste, poor appetite, nausea and 1 episode of emesis. ?  ?Acute kidney injury on chronic kidney injury stage IIIB ?prerenal azotemia ?poor PO intake ?acidosis ?-- hold Avapro and Lasix ?-- IV fluids with bicarb--now d/ced ?-- renal ultrasound ?-- patient follows with nephrology Dr Joylene John as outpatient. Baseline creatinine around 1.3--1.4 ( Feb 2023) ?-- creat 3.3--2.5--2.1 ?--K 4.3 ?  ?Fall/generalized weakness ?-- PT evaluation appreciated-- recommends rehab ?  ?Essential hypertension ?-- BP soft ?-- hold Avapro, amlodipine, metoprolol--keep log of BP at the facility and initiate meds accordingly ?  ?tobacco abuse ?-- advised cessation ?  ?anemia of chronic disease/CKD ?-- hemoglobin stable at 10 ?-- iron studies normal ?  ?history of chronic hypercalcemia with hyperparathyroidism ?parathyroid adenoma ?-- patient has had evaluation with endocrinology at [facility]. ?-She is recommended for surgical evaluation as outpatient for parathyroid surgery. ?  ?Family communication :Son Kirandeep Fariss 05/14/2021 ?Consults :none ?CODE STATUS: full ?DVT Prophylaxis :enoxaparin ?Level of care: Telemetry Medical ? ?D/c to rehab today ? ? ? ?   ? ? ?Disposition: Rehabilitation facility ?Diet recommendation:  ?Discharge Diet Orders (From admission, onward)  ? ?  Start     Ordered  ? 05/14/21 0000  Diet - low sodium heart healthy       ? 05/14/21 0935  ? ?  ?  ? ?  ? ?Cardiac diet ?DISCHARGE MEDICATION: ?Allergies as of 05/14/2021   ? ?   Reactions  ? Penicillins Swelling  ? Has patient had a PCN reaction causing immediate rash, facial/tongue/throat swelling, SOB or lightheadedness with hypotension: Yes ?Has patient had a PCN reaction causing severe rash involving mucus membranes or skin necrosis: Yes ?Has patient had a PCN reaction that required hospitalization: No ?Has patient had a PCN reaction occurring within the last 10 years: No ?If all of the above answers are "NO", then may proceed with Cephalosporin use.  ? ?  ? ?  ?Medication List  ?  ? ?STOP taking these medications   ? ?amLODipine 10 MG tablet ?Commonly known as: NORVASC ?  ?furosemide 20 MG tablet ?Commonly known as: LASIX ?  ?irbesartan 300 MG tablet ?Commonly known as: AVAPRO ?  ?metoprolol tartrate 25 MG tablet ?Commonly known as: LOPRESSOR ?  ? ?  ? ?TAKE these medications   ? ?atorvastatin 40 MG tablet ?Commonly known as: LIPITOR ?Take 1 tablet (40 mg total) by mouth every evening. for cholesterol. ?  ?colchicine 0.6 MG tablet ?Take 2 tablets by mouth at gout flare onset, take 1 additional tablet 2 hours later.  Then take 1 tablet twice daily until gout flare resolves. ?  ?Magnesium 500 MG Tabs ?Take by mouth. Taking 1 tablet once a day ?  ?  nicotine 14 mg/24hr patch ?Commonly known as: NICODERM CQ - dosed in mg/24 hours ?Place 1 patch (14 mg total) onto the skin daily. ?  ?raloxifene 60 MG tablet ?Commonly known as: EVISTA ?Take 60 mg by mouth daily. ?  ? ?  ? ? Contact information for follow-up providers   ? ? Pleas Koch, NP. Schedule an appointment as soon as possible for a visit in 1 week(s).   ?Specialty: Internal Medicine ?Why: hospital f/u ?Contact information: ?[place] [address] ?[phone] ? ? ?  ?  ? ? Lyla Son, MD. Schedule an appointment as soon as possible for a visit.   ?Specialty: Nephrology ?Why: f/u CKD ?Contact information: ?[address] ?[address] ?[place] [address] ?[phone] ? ? ?  ?  ? ?  ?  ? ? Contact information for after-discharge care   ? ? Destination   ? ? [facility] .   ?Service: Skilled Nursing ?Contact information: ?[address] ?[place] ?[phone] ? ?  ?  ? ?  ?  ? ?  ?  ? ?  ? ?Discharge Exam: ?Filed Weights  ? 05/11/21 1146  ?Weight: 88.5 kg  ? ? ? ?Condition at discharge: fair ? ?The results of significant diagnostics from this hospitalization (including imaging, microbiology, ancillary and laboratory) are listed below for reference.  ? ?Imaging Studies: ?DG Pelvis 1-2 Views ? ?Result Date: 05/11/2021 ?CLINICAL DATA:  [place] yesterday. EXAM: PELVIS - 1-2 VIEW COMPARISON:  None Available. FINDINGS: There is no evidence of pelvic fracture or diastasis. No pelvic bone lesions are seen. Degenerative changes of both hips. 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 IMPRESSION: 1. No acute osseous abnormality. Electronically Signed   By: Titus Dubin M.D.   On: 05/11/2021 12:38  ? ?CT CHEST LUNG CA SCREEN LOW DOSE W/O CM ? ?Result Date: 04/26/2021 ?CLINICAL DATA:  Lung cancer screening. Forty-three pack-year history. Current asymptomatic smoker. EXAM: CT CHEST WITHOUT CONTRAST LOW-DOSE FOR LUNG CANCER SCREENING TECHNIQUE: Multidetector CT imaging of the chest was performed following the standard protocol without IV contrast. RADIATION DOSE REDUCTION: This exam was performed according to the departmental dose-optimization program which includes automated exposure control, adjustment of the mA and/or kV according to patient size and/or use of iterative reconstruction technique. COMPARISON:  02/26/2020 FINDINGS: Cardiovascular: Heart size appears within normal limits. Aortic  atherosclerosis and coronary artery calcifications. The ascending thoracic aorta measures 4.2 cm, image 115/5. Unchanged from previous exam. No pericardial effusion. Mediastinum/Nodes: No enlarged mediastinal, hilar, or axillary lymph nodes. Thyroid gland, trachea, and esophagus demonstrate no significant findings. Lungs/Pleura: Moderate centrilobular and paraseptal emphysema. No pleural effusion or airspace consolidation. Scarring with architectural distortion is again identified within the right middle lobe and appears unchanged in the interval. Previously noted right lung nodules are unchanged in the interval measuring up to 4.6 mm. No new or suspicious lung nodules identified at this time. Upper Abdomen: No acute findings. Gallstones. Unchanged appearance of low-attenuation left adrenal nodule measuring 2.3 cm, image 52/2. This is compatible with a benign adenoma. No follow-up recommended Musculoskeletal: No acute findings. Thoracic degenerative disc disease. IMPRESSION: 1. Lung-RADS 2, benign appearance or behavior. Continue annual screening with low-dose chest CT without contrast in 12 months. 2. Gallstones. 3. Coronary artery calcifications. 4. 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 Recommend annual imaging followup by CTA or MRA. This recommendation follows 2010 ACCF/AHA/AATS/ACR/ASA/SCA/SCAI/SIR/STS/SVM Guidelines for the Diagnosis and Management of  Patients with Thoracic Aortic Disease. Circulation. 2010; 121: L390-Z009. Aortic aneurysm NOS (ICD10-I71.9) none 5. Aortic Atherosclerosis (ICD10-I70.0) and Emphysema (ICD10-J43.9). Electronically Signed   By: Kerby Moors M.D.   On: 04/26/2021 09:23 ? ?DG Femur Min 2 Views Right ? ?Result Date: 05/11/2021 ?CLINICAL DATA:  Right leg pain after fall yesterday. EXAM: RIGHT FEMUR 2 VIEWS COMPARISON:  None Available. FINDINGS: No acute fracture or dislocation. Degenerative changes of the right hip and knee. Soft tissues are  unremarkable. IMPRESSION: 1. No acute osseous abnormality. Electronically Signed   By: Titus Dubin M.D.   On: 05/11/2021 12:36   ? ?Microbiology: ?Results for orders placed or performed during the [facility]

## 2021-05-14 NOTE — Care Management Important Message (Signed)
Important Message ? ?Patient Details  ?Name: Danielle Zamora ?MRN: [id number] ?Date of Birth: 1945-10-10 ? ? ?Medicare Important Message Given:  N/A - LOS <3 / Initial given by admissions ? ? ? ? ?Juliann Pulse A [name] ?05/14/2021, 8:54 AM ?

## 2021-05-14 NOTE — TOC Transition Note (Signed)
Transition of Care (TOC) - CM/SW Discharge Note ? ? ?Patient Details  ?Name: Danielle Zamora ?MRN: [id number] ?Date of Birth: Dec 05, 1945 ? ?Transition of Care (TOC) CM/SW Contact:  ?Ignatius Kloos A Collette Pescador, LCSW ?Phone Number: ?05/14/2021, 10:20 AM ? ? ?Clinical Narrative:   Clinical Social Worker facilitated patient discharge including contacting patient family and facility to confirm patient discharge plans.  Clinical information faxed to facility and family agreeable with plan.  CSW arranged ambulance transport via [facility] to [place] to call [phone] for report prior to discharge. ? ? ? ?Final next level of care: [place] ?Barriers to Discharge: No Barriers Identified ? ? ?Patient Goals and CMS Choice ?Patient states their goals for this hospitalization and ongoing recovery are:: to get better ?  ?Choice offered to / list presented to : Patient ? ?Discharge Placement ?  ?           ?Patient chooses bed at:  [facility]) ?Patient to be transferred to facility by: [facility] ?Name of family member notified: son ?Patient and family notified of of transfer: 05/14/21 ? ?Discharge Plan and Services ?In-house Referral: Clinical Social Work ?  ?Post Acute Care Choice: [place]          ?  ?  ?  ?  ?  ?  ?  ?  ?  ?  ? ?Social Determinants of Health (SDOH) Interventions ?  ? ? ?Readmission Risk Interventions ?   ? View : No data to display.  ?  ?  ?  ? ? ? ? ? ?

## 2021-05-17 ENCOUNTER — Telehealth: Payer: Self-pay

## 2021-05-17 NOTE — Telephone Encounter (Signed)
[facility] Night - Client ?TELEPHONE ADVICE RECORD ?[facility]? ?Patient ?Name: ?Danielle ?A Zamora ?Gender: Unknown ?DOB: 19-Aug-1945 ?Age: 76 Y 62 M 37 ?D ?Return ?Phone ?Number: ?[phone] ?(Primary) ?Address: ?City/ ?State/ ?Zip: [place] ? [address] ?Client [place] Night - Client ?Client Site [place] ?Provider Alma Friendly - NP ?Contact Type Call ?Who Is Calling Patient / Member / Family / Caregiver ?Call Type Triage / Clinical ?Relationship To Patient Self ?Return Phone Number [phone] (Primary) ?Chief Complaint Joint Swelling ?Reason for Call Symptomatic / Request for Health Information ?Initial Comment Caller needs gout medication prescription. To ?prevent gout. She is in the rehab center and did too ?much and she is having a flare up. ?[place] Not Listed pt is currently at [facility] ?Translation No ?Nurse Assessment ?Nurse: Geraldine Contras RN, [place] Date/Time (Eastern Time): 05/16/2021 2:49:38 PM ?Confirm and document reason for call. If ?symptomatic, describe symptoms. ?---Caller states that she was waiting an update on ?medication for her gout. She is in the rehab center. She ?states that she is having a flare up and is suppose to ?take this new medication and wanting to know update. ?Pain to left big toe, pressure if she applies it, 10/10, if ?not applying pressure there is no pain. Pt has not taken ?any pain medication. ?Does the patient have any new or worsening ?symptoms? ---Yes ?Will a triage be completed? ---Yes ?Related visit to physician within the last 2 weeks? ---Yes ?Does the PT have any chronic conditions? (i.e. ?diabetes, asthma, this includes High risk factors for ?pregnancy, etc.) ?---Yes ?List chronic conditions. ---osteoporosis, HTN, ?Is this a behavioral health or substance abuse call? ---No ?Guidelines ?Guideline Title Affirmed Question Affirmed Notes Nurse Date/Time  (Eastern ?Time) ?Toe Pain [1] SEVERE pain ?(e.g., excruciating, ?unable to do any ?normal activities) ?AND [2] not ?Marmol, RN, ?[place] ?05/16/2021 2:57:11 PM ?PLEASE NOTE: All timestamps contained within this report are represented as [place] Standard Time. ?CONFIDENTIALTY NOTICE: This fax transmission is intended only for the addressee. It contains information that is legally privileged, confidential or ?otherwise protected from use or disclosure. If you are not the intended recipient, you are strictly prohibited from reviewing, disclosing, copying using ?or disseminating any of this information or taking any action in reliance on or regarding this information. If you have received this fax in error, please ?notify us immediately by telephone so that we can arrange for its return to [place]. Phone: [phone], Toll-Free: [phone], Fax: [phone] ?Page: 2 of 2 ?Call Id: [id number] ?Guidelines ?Guideline Title Affirmed Question Affirmed Notes Nurse Date/Time (Eastern ?Time) ?improved after ?2 hours of pain ?medicine ?Disp. Time (Eastern ?Time) Disposition Final User ?05/16/2021 3:28:42 PM See HCP within 4 Hours (or ?PCP triage) ?Yes Marmol, [place], [place] ?Caller Disagree/Comply Comply ?Caller Understands Yes ?PreDisposition Call Doctor ?Care Advice Given Per Guideline ?SEE HCP (OR PCP TRIAGE) WITHIN 4 HOURS: PAIN MEDICINES: * For pain relief, you can take either acetaminophen, ?ibuprofen, or naproxen. CALL BACK IF: * You become worse CARE ADVICE given per Toe Pain (Adult) guideline. ?Comments ?User: Darylene Price, RN Date/Time Eilene Ghazi Time): 05/16/2021 3:14:33 PM ?Pt is currently at [facility] for physical therapy. I got a dispo of see HCP within 4 hrs. ?There are nurses at the facility, asked pt to use the call light to get nurse in the room to be able to speak with them ?to let them know about dispo's recommendation and ask for pt's medication  prescription for her gout. She stated ?that she  has talked to the nurses at the facility and has not heard anything or had any updates. ?User: Darylene Price, RN Date/Time Eilene Ghazi Time): 05/16/2021 3:27:55 PM ?Spoke with pt's nurse at the skilled rehab, [employer]. Went over the recommendation. Stated that he would start ?pain medication protocol and let the doctor know about the pain pt is having to her big toe. At the moment there is ?no medication for her gout in her med list. ?Referrals ?GO TO FACILITY OTHER - SPECIF ?

## 2021-05-17 NOTE — Telephone Encounter (Signed)
Usually there is a provider at [facility] that sees pt there while they are residents. From access note appears access nurse spoke with someone at [facility]. Sending note to Allie Bossier NP who is out of office,Dr Einar Pheasant who is in office and [place] CMA. ?

## 2021-05-17 NOTE — Telephone Encounter (Signed)
If she is currently residing at [facility] for rehab then she needs to notify the nurse of her symptoms. Yes, there is a provider there on staff.  ?

## 2021-05-17 NOTE — Telephone Encounter (Signed)
Agree with plan to get assessment and treatment at rehab. Routing to MA to check back with patient tomorrow to ensure appropriate care.  ?

## 2021-05-17 NOTE — Telephone Encounter (Signed)
Called patient to let her know she states she thought at one time you were going to send her something in for gout. While I was on the phone with patient she said that her aid was there to get her up and I would have to call back at later time. Wanted to make sure you did not want to call something in and she needed to have addressed by someone there.  ?

## 2021-05-18 NOTE — Telephone Encounter (Signed)
She has a prescription for colchicine on file for [place], this is used as needed for gout flares.  She should have the same prescription on file at the rehab center.  They should be able to give her colchicine if needed for gout flare. ?

## 2021-05-18 NOTE — Telephone Encounter (Signed)
Spoke to patient and Danielle Zamora, her nurse at [facility], patient states no one is helping her there and it was not a good place to be. She did not have Colchicine medication. I spoke with Danielle Zamora who said they did not have Colchicine on her medication list, I gave her the name and the directions for the medication as we have in the chart. Danielle Zamora will let NP know in the morning about this to get this started prn, NP left for the day already. Danielle Zamora stated that patient has not said anything to her today about her toe hurting. Patient has been at rehab for about 2 to 3 days now. When I advised patient of everything and that nurse was not aware about the problem patient said "they know I have toe pain because I have been complaining about it all day." Not sure what exactly happened or where the break down is. Advised patient to make sure and express concerns to the nurse and the provider there as they will have to treat the patient while she is at their facility and let her know that provider suppose to check in on the patient in the morning. ?fyi ?

## 2021-05-19 NOTE — Telephone Encounter (Signed)
Noted. Yes, she needs to voice concerns to the nursing staff and provider located at the rehab center. ?

## 2021-05-20 ENCOUNTER — Other Ambulatory Visit: Payer: Self-pay | Admitting: Primary Care

## 2021-05-20 DIAGNOSIS — E785 Hyperlipidemia, unspecified: Secondary | ICD-10-CM

## 2021-05-24 ENCOUNTER — Inpatient Hospital Stay (HOSPITAL_COMMUNITY)
Admission: EM | Admit: 2021-05-24 | Discharge: 2021-05-30 | DRG: 439 | Disposition: A | Discharge status: Skilled Nursing Facility | Payer: Medicare Other | Source: Skilled Nursing Facility | Attending: Internal Medicine | Admitting: Internal Medicine

## 2021-05-24 ENCOUNTER — Other Ambulatory Visit: Payer: Self-pay

## 2021-05-24 ENCOUNTER — Emergency Department (HOSPITAL_COMMUNITY): Payer: Medicare Other

## 2021-05-24 DIAGNOSIS — N1832 Chronic kidney disease, stage 3b: Secondary | ICD-10-CM | POA: Diagnosis present

## 2021-05-24 DIAGNOSIS — D631 Anemia in chronic kidney disease: Secondary | ICD-10-CM | POA: Diagnosis present

## 2021-05-24 DIAGNOSIS — I7121 Aneurysm of the ascending aorta, without rupture: Secondary | ICD-10-CM | POA: Diagnosis present

## 2021-05-24 DIAGNOSIS — R9431 Abnormal electrocardiogram [ECG] [EKG]: Secondary | ICD-10-CM | POA: Diagnosis not present

## 2021-05-24 DIAGNOSIS — I129 Hypertensive chronic kidney disease with stage 1 through stage 4 chronic kidney disease, or unspecified chronic kidney disease: Secondary | ICD-10-CM | POA: Diagnosis present

## 2021-05-24 DIAGNOSIS — M810 Age-related osteoporosis without current pathological fracture: Secondary | ICD-10-CM | POA: Diagnosis present

## 2021-05-24 DIAGNOSIS — R7989 Other specified abnormal findings of blood chemistry: Principal | ICD-10-CM | POA: Diagnosis present

## 2021-05-24 DIAGNOSIS — M109 Gout, unspecified: Secondary | ICD-10-CM | POA: Diagnosis present

## 2021-05-24 DIAGNOSIS — K8020 Calculus of gallbladder without cholecystitis without obstruction: Secondary | ICD-10-CM | POA: Diagnosis present

## 2021-05-24 DIAGNOSIS — R17 Unspecified jaundice: Secondary | ICD-10-CM

## 2021-05-24 DIAGNOSIS — E871 Hypo-osmolality and hyponatremia: Secondary | ICD-10-CM | POA: Diagnosis present

## 2021-05-24 DIAGNOSIS — D510 Vitamin B12 deficiency anemia due to intrinsic factor deficiency: Secondary | ICD-10-CM | POA: Diagnosis present

## 2021-05-24 DIAGNOSIS — E213 Hyperparathyroidism, unspecified: Secondary | ICD-10-CM | POA: Diagnosis present

## 2021-05-24 DIAGNOSIS — Z880 Allergy status to penicillin: Secondary | ICD-10-CM

## 2021-05-24 DIAGNOSIS — E039 Hypothyroidism, unspecified: Secondary | ICD-10-CM | POA: Diagnosis present

## 2021-05-24 DIAGNOSIS — I10 Essential (primary) hypertension: Secondary | ICD-10-CM | POA: Diagnosis present

## 2021-05-24 DIAGNOSIS — K851 Biliary acute pancreatitis without necrosis or infection: Secondary | ICD-10-CM | POA: Diagnosis present

## 2021-05-24 DIAGNOSIS — I4510 Unspecified right bundle-branch block: Secondary | ICD-10-CM | POA: Diagnosis present

## 2021-05-24 DIAGNOSIS — Z79899 Other long term (current) drug therapy: Secondary | ICD-10-CM | POA: Diagnosis not present

## 2021-05-24 DIAGNOSIS — Z6834 Body mass index (BMI) 34.0-34.9, adult: Secondary | ICD-10-CM | POA: Diagnosis not present

## 2021-05-24 DIAGNOSIS — D509 Iron deficiency anemia, unspecified: Secondary | ICD-10-CM | POA: Diagnosis present

## 2021-05-24 DIAGNOSIS — K635 Polyp of colon: Secondary | ICD-10-CM | POA: Diagnosis present

## 2021-05-24 DIAGNOSIS — F17210 Nicotine dependence, cigarettes, uncomplicated: Secondary | ICD-10-CM | POA: Diagnosis present

## 2021-05-24 DIAGNOSIS — D649 Anemia, unspecified: Secondary | ICD-10-CM | POA: Diagnosis not present

## 2021-05-24 DIAGNOSIS — M47812 Spondylosis without myelopathy or radiculopathy, cervical region: Secondary | ICD-10-CM | POA: Diagnosis present

## 2021-05-24 DIAGNOSIS — K317 Polyp of stomach and duodenum: Secondary | ICD-10-CM | POA: Diagnosis present

## 2021-05-24 DIAGNOSIS — E8809 Other disorders of plasma-protein metabolism, not elsewhere classified: Secondary | ICD-10-CM | POA: Diagnosis present

## 2021-05-24 DIAGNOSIS — K8011 Calculus of gallbladder with chronic cholecystitis with obstruction: Secondary | ICD-10-CM

## 2021-05-24 DIAGNOSIS — E669 Obesity, unspecified: Secondary | ICD-10-CM | POA: Diagnosis present

## 2021-05-24 DIAGNOSIS — Z841 Family history of disorders of kidney and ureter: Secondary | ICD-10-CM

## 2021-05-24 DIAGNOSIS — R0603 Acute respiratory distress: Secondary | ICD-10-CM | POA: Diagnosis not present

## 2021-05-24 DIAGNOSIS — Z803 Family history of malignant neoplasm of breast: Secondary | ICD-10-CM

## 2021-05-24 HISTORY — DX: Calculus of gallbladder without cholecystitis without obstruction: K80.20

## 2021-05-24 LAB — URINALYSIS, ROUTINE W REFLEX MICROSCOPIC
Bilirubin Urine: NEGATIVE
Glucose, UA: NEGATIVE mg/dL
Ketones, ur: NEGATIVE mg/dL
Leukocytes,Ua: NEGATIVE
Nitrite: NEGATIVE
Protein, ur: 100 mg/dL — AB
Specific Gravity, Urine: 1.014 (ref 1.005–1.030)
pH: 6 (ref 5.0–8.0)

## 2021-05-24 LAB — CBC WITH DIFFERENTIAL/PLATELET
Abs Immature Granulocytes: 0.04 10*3/uL (ref 0.00–0.07)
Basophils Absolute: 0.1 10*3/uL (ref 0.0–0.1)
Basophils Relative: 1 %
Eosinophils Absolute: 0.1 10*3/uL (ref 0.0–0.5)
Eosinophils Relative: 1 %
HCT: 28.5 % — ABNORMAL LOW (ref 36.0–46.0)
Hemoglobin: 8.7 g/dL — ABNORMAL LOW (ref 12.0–15.0)
Immature Granulocytes: 1 %
Lymphocytes Relative: 33 %
Lymphs Abs: 2.5 10*3/uL (ref 0.7–4.0)
MCH: 30 pg (ref 26.0–34.0)
MCHC: 30.5 g/dL (ref 30.0–36.0)
MCV: 98.3 fL (ref 80.0–100.0)
Monocytes Absolute: 1 10*3/uL (ref 0.1–1.0)
Monocytes Relative: 14 %
Neutro Abs: 3.9 10*3/uL (ref 1.7–7.7)
Neutrophils Relative %: 50 %
Platelets: 383 10*3/uL (ref 150–400)
RBC: 2.9 MIL/uL — ABNORMAL LOW (ref 3.87–5.11)
RDW: 15.9 % — ABNORMAL HIGH (ref 11.5–15.5)
WBC: 7.6 10*3/uL (ref 4.0–10.5)
nRBC: 0.9 % — ABNORMAL HIGH (ref 0.0–0.2)

## 2021-05-24 LAB — HEPATITIS PANEL, ACUTE
HCV Ab: NONREACTIVE
Hep A IgM: NONREACTIVE
Hep B C IgM: NONREACTIVE
Hepatitis B Surface Ag: NONREACTIVE

## 2021-05-24 LAB — COMPREHENSIVE METABOLIC PANEL
ALT: 309 U/L — ABNORMAL HIGH (ref 0–44)
AST: 1131 U/L — ABNORMAL HIGH (ref 15–41)
Albumin: 1.8 g/dL — ABNORMAL LOW (ref 3.5–5.0)
Alkaline Phosphatase: 537 U/L — ABNORMAL HIGH (ref 38–126)
Anion gap: 7 (ref 5–15)
BUN: 24 mg/dL — ABNORMAL HIGH (ref 8–23)
CO2: 21 mmol/L — ABNORMAL LOW (ref 22–32)
Calcium: 10.3 mg/dL (ref 8.9–10.3)
Chloride: 105 mmol/L (ref 98–111)
Creatinine, Ser: 1.45 mg/dL — ABNORMAL HIGH (ref 0.44–1.00)
GFR, Estimated: 37 mL/min — ABNORMAL LOW (ref >60)
Glucose, Bld: 113 mg/dL — ABNORMAL HIGH (ref 70–99)
Potassium: 5 mmol/L (ref 3.5–5.1)
Sodium: 133 mmol/L — ABNORMAL LOW (ref 135–145)
Total Bilirubin: 3.9 mg/dL — ABNORMAL HIGH (ref 0.3–1.2)
Total Protein: 8.6 g/dL — ABNORMAL HIGH (ref 6.5–8.1)

## 2021-05-24 LAB — PROTIME-INR
INR: 1 (ref 0.8–1.2)
Prothrombin Time: 13.5 seconds (ref 11.4–15.2)

## 2021-05-24 LAB — LIPASE, BLOOD: Lipase: 138 U/L — ABNORMAL HIGH (ref 11–51)

## 2021-05-24 LAB — ETHANOL: Alcohol, Ethyl (B): <10 mg/dL (ref <10)

## 2021-05-24 MED ORDER — NICOTINE 14 MG/24HR TD PT24
14.0000 mg | MEDICATED_PATCH | Freq: Every day | TRANSDERMAL | Status: DC
  Administered 2021-05-24 – 2021-05-30 (×7): 14 mg via TRANSDERMAL
  Filled 2021-05-24 (×7): qty 1

## 2021-05-24 MED ORDER — SODIUM CHLORIDE 0.9 % IV SOLN: INTRAVENOUS | Status: AC

## 2021-05-24 MED ORDER — LORAZEPAM 2 MG/ML IJ SOLN
1.0000 mg | INTRAMUSCULAR | Status: AC | PRN
  Administered 2021-05-25: 1 mg via INTRAVENOUS
  Filled 2021-05-24: qty 1

## 2021-05-24 MED ORDER — ONDANSETRON HCL 4 MG/2ML IJ SOLN
4.0000 mg | Freq: Four times a day (QID) | INTRAMUSCULAR | Status: DC | PRN
Start: 2021-05-24 — End: 2021-05-30

## 2021-05-24 MED ORDER — ONDANSETRON HCL 4 MG PO TABS: 4.0000 mg | ORAL_TABLET | Freq: Four times a day (QID) | ORAL | Status: DC | PRN

## 2021-05-24 NOTE — ED Notes (Signed)
Pt's son updated that the pt will be admitted to the hospital. Son requesting to be updated when she is moved to a room upstairs  ? ?

## 2021-05-24 NOTE — Assessment & Plan Note (Signed)
Stable

## 2021-05-24 NOTE — Subjective & Objective (Signed)
CC: elevated LFTs ?HPI: ?76 year old African-American female history of hypertension, chronic kidney stage stage IIIb, anemia chronic kidney disease, history of pernicious anemia, history of hyperparathyroidism who was recently discharged to nursing home after spending about a week in the hospital at [facility].  She was discharged to the [facility] after a fall. ? ?Routine lab work performed at the facility showed elevated LFTs (reportedly).  A copy of her LFTs were not sent.  Repeat LFTs here in the ER showed AST of 1131, ALT 309, alk phos of 537, total bili 3.9.  Lipase is slightly elevated at 138.  Patient not having any pain.  She denies any nausea or vomiting. ? ?Hemoglobin is 8.7.  Last week when she was discharged it was 9.4. ? ?Right upper quadrant ultrasound has been ordered but not yet performed. ? ?[facility] hospitalist contacted for admission. ? ?

## 2021-05-24 NOTE — Assessment & Plan Note (Signed)
Has known gallstones on CT chest from 04/2021. ?

## 2021-05-24 NOTE — ED Provider Notes (Signed)
?[facility] ?Provider Note ? ? ?CSN: [id number] ?Arrival date & time: 05/24/21  1800 ? ?  ? ?History ? ?Chief Complaint  ?Patient presents with  ? abornal labs  ? ? ?Danielle Zamora is a 76 y.o. female. ? ?76 year old female with prior medical history as detailed below presents for evaluation.  Patient sent to the ED for evaluation of noted elevated liver function test. ? ?Patient without complaint of abdominal pain.  She denies nausea or vomiting.  She denies recent fever.  She complains of painful gouty flare in the left great toe.  This does not appear to be a new issue ? ?She does have a history of gallstones. ? ?The history is provided by the patient and medical records.  ?Illness ?Location:  Elevated liver function tests ?Severity:  Moderate ?Onset quality:  Unable to specify ?Timing:  Unable to specify ?Progression:  Unable to specify ?Chronicity:  New ? ?  ? ?Home Medications ?Prior to Admission medications   ?Medication Sig Start Date End Date Taking? Authorizing Provider  ?atorvastatin (LIPITOR) 40 MG tablet Take 1 tablet (40 mg total) by mouth every evening. for cholesterol. 04/23/21   Pleas Koch, NP  ?colchicine 0.6 MG tablet Take 2 tablets by mouth at gout flare onset, take 1 additional tablet 2 hours later.  Then take 1 tablet twice daily until gout flare resolves. 04/21/21   Pleas Koch, NP  ?Magnesium 500 MG TABS Take by mouth. Taking 1 tablet once a day    [provider]  ?nicotine (NICODERM CQ - DOSED IN MG/24 HOURS) 14 mg/24hr patch Place 1 patch (14 mg total) onto the skin daily. 05/14/21   Fritzi Mandes, MD  ?raloxifene (EVISTA) 60 MG tablet Take 60 mg by mouth daily. 01/19/21   [provider]  ?   ? ?Allergies    ?Penicillins   ? ?Review of Systems   ?Review of Systems  ?All other systems reviewed and are negative. ? ?Physical Exam ?Updated Vital Signs ?BP (!) 134/96   Pulse 98   Temp 99.8 ?F (37.7 ?C) (Oral)   Resp 19   Ht 5'  3" (1.6 m)   Wt 81.6 kg   SpO2 100%   BMI 31.89 kg/m?  ?Physical Exam ?Vitals and nursing note reviewed.  ?Constitutional:   ?   General: She is not in acute distress. ?   Appearance: Normal appearance. She is well-developed.  ?HENT:  ?   Head: Normocephalic and atraumatic.  ?Eyes:  ?   Conjunctiva/sclera: Conjunctivae normal.  ?   Pupils: Pupils are equal, round, and reactive to light.  ?Cardiovascular:  ?   Rate and Rhythm: Normal rate and regular rhythm.  ?   Heart sounds: Normal heart sounds.  ?Pulmonary:  ?   Effort: Pulmonary effort is normal. No respiratory distress.  ?   Breath sounds: Normal breath sounds.  ?Abdominal:  ?   General: There is no distension.  ?   Palpations: Abdomen is soft.  ?   Tenderness: There is no abdominal tenderness.  ?Musculoskeletal:     ?   General: No deformity. Normal range of motion.  ?   Cervical back: Normal range of motion and neck supple.  ?Skin: ?   General: Skin is warm and dry.  ?Neurological:  ?   General: No focal deficit present.  ?   Mental Status: She is alert and oriented to person, place, and time.  ? ? ?ED Results / Procedures /  Treatments   ?Labs ?(all labs ordered are listed, but only abnormal results are displayed) ?Labs Reviewed  ?COMPREHENSIVE METABOLIC PANEL - Abnormal; Notable for the following components:  ?    Result Value  ? Sodium 133 (*)   ? CO2 21 (*)   ? Glucose, Bld 113 (*)   ? BUN 24 (*)   ? Creatinine, Ser 1.45 (*)   ? Total Protein 8.6 (*)   ? Albumin 1.8 (*)   ? AST 1,131 (*)   ? ALT 309 (*)   ? Alkaline Phosphatase 537 (*)   ? Total Bilirubin 3.9 (*)   ? GFR, Estimated 37 (*)   ? All other components within normal limits  ?CBC WITH DIFFERENTIAL/PLATELET - Abnormal; Notable for the following components:  ? RBC 2.90 (*)   ? Hemoglobin 8.7 (*)   ? HCT 28.5 (*)   ? RDW 15.9 (*)   ? nRBC 0.9 (*)   ? All other components within normal limits  ?LIPASE, BLOOD - Abnormal; Notable for the following components:  ? Lipase 138 (*)   ? All other  components within normal limits  ?ETHANOL  ?PROTIME-INR  ?URINALYSIS, ROUTINE W REFLEX MICROSCOPIC  ?HEPATITIS PANEL, ACUTE  ? ? ?EKG ?None ? ?Radiology ?No results found. ? ?Procedures ?Procedures  ? ? ?Medications Ordered in ED ?Medications - No data to display ? ?ED Course/ Medical Decision Making/ A&P ?  ?                        ?Medical Decision Making ?Amount and/or Complexity of Data Reviewed ?Labs: ordered. ?Radiology: ordered. ? ?Risk ?Decision regarding hospitalization. ? ? ? ?Medical Screen Complete ? ?This patient presented to the ED with complaint of elevated liver function test. ? ?This complaint involves an extensive number of treatment options. The initial differential diagnosis includes, but is not limited to, transaminitis secondary to gallstones, hepatic dysfunction, etc. ? ?This presentation is: Acute, Chronic, Self-Limited, Previously Undiagnosed, Uncertain Prognosis, Complicated, Systemic Symptoms, and Threat to Life/Bodily Function ? ?Patient is presenting with reported elevated liver function test. ? ?Patient is without symptoms such as abdominal pain, fever, nausea, vomiting, or other complaint. ? ?Patient is currently being treated with colchicine for gout exacerbation.  Patient does have history of gallstones. ? ?Patient will require work-up and inpatient evaluation. ? ?Hospitalist services made aware of case and will evaluate. ? ?Additional history obtained: ? ?External records from outside sources obtained and reviewed including prior ED visits and prior Inpatient records.  ? ? ?Lab Tests: ? ?I ordered and personally interpreted labs.  The pertinent results include: CBC, CMP, lipase, UA, INR, EtOH ? ?Cardiac Monitoring: ? ?The patient was maintained on a cardiac monitor.  I personally viewed and interpreted the cardiac monitor which showed an underlying rhythm of: NSR ? ?Problem List / ED Course: ? ?Elevated liver function tests ? ? ?Reevaluation: ? ?After the interventions noted above,  I reevaluated the patient and found that they have:stayed the same ? ?Disposition: ? ?After consideration of the diagnostic results and the patients response to treatment, I feel that the patent would benefit from admission.  ? ? ? ? ? ? ? ? ?Final Clinical Impression(s) / ED Diagnoses ?Final diagnoses:  ?Elevated liver function tests  ? ? ?Rx / DC Orders ?ED Discharge Orders   ? ? None  ? ?  ? ? ?  ?Valarie Merino, MD ?05/24/21 2007 ? ?

## 2021-05-24 NOTE — Assessment & Plan Note (Addendum)
Admit to med/surg bed. RUQ U/S ordered. May need MRCP. Will need GI consult +/- surgery consult to consider cholecystectomy if she has CBD obstruction. Hold statin. Keep NPO. Start gentle IVF. Hold chemical DVT prophylaxis in case she need ERCP +/- cholecystectomy/cholecystostomy ?

## 2021-05-24 NOTE — ED Notes (Signed)
Pt's son [name] called per pt request and given an update regarding the pt's care.  ?

## 2021-05-24 NOTE — ED Triage Notes (Signed)
Pt BIB EMS due to elevated LFT's from dr office. Pt is axox4. VSS.  ?

## 2021-05-24 NOTE — H&P (Signed)
?History and Physical  ? ? ?Danielle Zamora TGY:[id number] DOB: 25-Apr-1945 DOA: 05/24/2021 ? ?DOS: the patient was seen and examined on 05/24/2021 ? ?PCP: Pleas Koch, NP  ? ?Patient coming from: [facility] ? ?I have personally briefly reviewed patient's old medical records in [place] ? ?CC: elevated LFTs ?HPI: ?76 year old African-American female history of hypertension, chronic kidney stage stage IIIb, anemia chronic kidney disease, history of pernicious anemia, history of hyperparathyroidism who was recently discharged to nursing home after spending about a week in the hospital at [facility].  She was discharged to the [facility] after a fall. ? ?Routine lab work performed at the facility showed elevated LFTs (reportedly).  A copy of her LFTs were not sent.  Repeat LFTs here in the ER showed AST of 1131, ALT 309, alk phos of 537, total bili 3.9.  Lipase is slightly elevated at 138.  Patient not having any pain.  She denies any nausea or vomiting. ? ?Hemoglobin is 8.7.  Last week when she was discharged it was 9.4. ? ?Right upper quadrant ultrasound has been ordered but not yet performed. ? ?[facility] hospitalist contacted for admission. ? ?ED Course: RUQ U/S ordered. ? ?Review of Systems:  ?Review of Systems  ?Constitutional: Negative.   ?HENT: Negative.    ?Eyes: Negative.   ?Respiratory: Negative.    ?Cardiovascular: Negative.   ?Gastrointestinal: Negative.  Negative for abdominal pain, constipation, diarrhea, nausea and vomiting.  ?Genitourinary: Negative.   ?Musculoskeletal: Negative.   ?Skin: Negative.   ?Neurological:  Positive for weakness.  ?     Legs feel weak. Was like this last week when she was discharged to SNF  ?Endo/Heme/Allergies: Negative.   ?Psychiatric/Behavioral: Negative.    ?All other systems reviewed and are negative. ? ?Past Medical History:  ?Diagnosis Date  ? Essential hypertension   ? Pernicious anemia   ? ? ?No past surgical history on file. ? ? reports that she has  been smoking cigarettes. She has a 42.00 pack-year smoking history. She has never used smokeless tobacco. She reports current alcohol use of about 2.0 standard drinks per week. She reports current drug use. Drug: Marijuana. ? ?Allergies  ?Allergen Reactions  ? Penicillins Swelling  ?  Has patient had a PCN reaction causing immediate rash, facial/tongue/throat swelling, SOB or lightheadedness with hypotension: Yes ?Has patient had a PCN reaction causing severe rash involving mucus membranes or skin necrosis: Yes ?Has patient had a PCN reaction that required hospitalization: No ?Has patient had a PCN reaction occurring within the last 10 years: No ?If all of the above answers are "NO", then may proceed with Cephalosporin use. ?  ? ? ?Family History  ?Problem Relation Age of Onset  ? Kidney disease Mother   ? Breast cancer Sister   ? ? ?Prior to Admission medications   ?Medication Sig Start Date End Date Taking? Authorizing Provider  ?atorvastatin (LIPITOR) 40 MG tablet Take 1 tablet (40 mg total) by mouth every evening. for cholesterol. 04/23/21   Pleas Koch, NP  ?colchicine 0.6 MG tablet Take 2 tablets by mouth at gout flare onset, take 1 additional tablet 2 hours later.  Then take 1 tablet twice daily until gout flare resolves. 04/21/21   Pleas Koch, NP  ?Magnesium 500 MG TABS Take by mouth. Taking 1 tablet once a day    [provider]  ?nicotine (NICODERM CQ - DOSED IN MG/24 HOURS) 14 mg/24hr patch Place 1 patch (14 mg total) onto the  skin daily. 05/14/21   Fritzi Mandes, MD  ?raloxifene (EVISTA) 60 MG tablet Take 60 mg by mouth daily. 01/19/21   [provider]  ? ? ?Physical Exam: ?Vitals:  ? 05/24/21 1845 05/24/21 1900 05/24/21 1915 05/24/21 1925  ?BP: 140/78 126/82 (!) 134/96   ?Pulse:   98   ?Resp:   19   ?Temp:      ?TempSrc:      ?SpO2:   100% 100%  ?Weight:      ?Height:      ? ? ?Physical Exam ?Vitals and nursing note reviewed.  ?Constitutional:   ?   General: She is not in  acute distress. ?   Appearance: She is not ill-appearing, toxic-appearing or diaphoretic.  ?HENT:  ?   Head: Normocephalic and atraumatic.  ?   Nose: Nose normal. No rhinorrhea.  ?Eyes:  ?   General: Scleral icterus present.  ?Cardiovascular:  ?   Rate and Rhythm: Regular rhythm. Tachycardia present.  ?   Pulses: Normal pulses.  ?Pulmonary:  ?   Effort: Pulmonary effort is normal. No respiratory distress.  ?   Breath sounds: Normal breath sounds. No wheezing.  ?Abdominal:  ?   General: Bowel sounds are normal. There is no distension.  ?   Palpations: There is no mass.  ?   Tenderness: There is no abdominal tenderness. There is no guarding.  ?   Comments: No RUQ tenderness or pain with palpation ?No epigastric tenderness.  ?Musculoskeletal:  ?   Right lower leg: No edema.  ?   Left lower leg: No edema.  ?Skin: ?   General: Skin is warm and dry.  ?   Capillary Refill: Capillary refill takes less than 2 seconds.  ?Neurological:  ?   General: No focal deficit present.  ?   Mental Status: She is alert and oriented to person, place, and time.  ?  ? ?Labs on Admission: I have personally reviewed following labs and imaging studies ? ?CBC: ?Recent Labs  ?Lab 05/24/21 ?1830  ?WBC 7.6  ?NEUTROABS 3.9  ?HGB 8.7*  ?HCT 28.5*  ?MCV 98.3  ?PLT 383  ? ?Basic Metabolic Panel: ?Recent Labs  ?Lab 05/24/21 ?1830  ?NA 133*  ?K 5.0  ?CL 105  ?CO2 21*  ?GLUCOSE 113*  ?BUN 24*  ?CREATININE 1.45*  ?CALCIUM 10.3  ? ?GFR: ?Estimated Creatinine Clearance: 33.4 mL/min (A) (by C-G formula based on SCr of 1.45 mg/dL (H)). ?Liver Function Tests: ?Recent Labs  ?Lab 05/24/21 ?1830  ?AST 1,131*  ?ALT 309*  ?ALKPHOS 537*  ?BILITOT 3.9*  ?PROT 8.6*  ?ALBUMIN 1.8*  ? ?Recent Labs  ?Lab 05/24/21 ?1830  ?LIPASE 138*  ? ?No results for input(s): AMMONIA in the last 168 hours. ?Coagulation Profile: ?Recent Labs  ?Lab 05/24/21 ?1830  ?INR 1.0  ? ?Cardiac Enzymes: ?No results for input(s): CKTOTAL, CKMB, CKMBINDEX, TROPONINI, TROPONINIHS in the last 168  hours. ?BNP (last 3 results) ?No results for input(s): PROBNP in the last 8760 hours. ?HbA1C: ?No results for input(s): HGBA1C in the last 72 hours. ?CBG: ?No results for input(s): GLUCAP in the last 168 hours. ?Lipid Profile: ?No results for input(s): CHOL, HDL, LDLCALC, TRIG, CHOLHDL, LDLDIRECT in the last 72 hours. ?Thyroid Function Tests: ?No results for input(s): TSH, T4TOTAL, FREET4, T3FREE, THYROIDAB in the last 72 hours. ?Anemia Panel: ?No results for input(s): VITAMINB12, FOLATE, FERRITIN, TIBC, IRON, RETICCTPCT in the last 72 hours. ?Urine analysis: ?   ?Component Value Date/Time  ? COLORURINE AMBER (  A) 05/11/2021 1658  ? APPEARANCEUR CLOUDY (A) 05/11/2021 1658  ? LABSPEC 1.015 05/11/2021 1658  ? PHURINE 5.0 05/11/2021 1658  ? GLUCOSEU NEGATIVE 05/11/2021 1658  ? HGBUR SMALL (A) 05/11/2021 1658  ? [place] NEGATIVE 05/11/2021 1658  ? [place] NEGATIVE 05/11/2021 1658  ? PROTEINUR 30 (A) 05/11/2021 1658  ? NITRITE NEGATIVE 05/11/2021 1658  ? LEUKOCYTESUR NEGATIVE 05/11/2021 1658  ? ? ?Radiological Exams on Admission: I have personally reviewed images ?No results found. ? ?EKG: My personal review of EKG shows: no ekg ? ? ? ?Assessment/Plan ?Principal Problem: ?  Elevated LFTs ?Active Problems: ?  Cholelithiasis ?  Essential hypertension ?  Stage 3b chronic kidney disease ([place]) ?  Anemia due to chronic kidney disease ?  ? ?Assessment and Plan: ?* Elevated LFTs ?Admit to med/surg bed. RUQ U/S ordered. May need MRCP. Will need GI consult +/- surgery consult to consider cholecystectomy if she has CBD obstruction. Hold statin. Keep NPO. Start gentle IVF. Hold chemical DVT prophylaxis in case she need ERCP +/- cholecystectomy/cholecystostomy ? ?Cholelithiasis ?Has known gallstones on CT chest from 04/2021. ? ?Anemia due to chronic kidney disease ?Stable. ? ?Stage 3b chronic kidney disease ([place]) ?Stable. ? ?Essential hypertension ?Stable. ? ? ? ?DVT prophylaxis: SCDs ?Code Status: Full Code ?Family  Communication: no family at bedside  ?Disposition Plan: return to SNF  ?Consults called: none  ?Admission status: Inpatient, Med-Surg ? ? ?Kristopher Oppenheim, DO ?[facility] ?05/24/2021, 8:07 PM  ? ? ?

## 2021-05-25 ENCOUNTER — Inpatient Hospital Stay (HOSPITAL_COMMUNITY): Payer: Medicare Other

## 2021-05-25 ENCOUNTER — Encounter (HOSPITAL_COMMUNITY): Payer: Self-pay | Admitting: Internal Medicine

## 2021-05-25 DIAGNOSIS — R7989 Other specified abnormal findings of blood chemistry: Secondary | ICD-10-CM | POA: Diagnosis not present

## 2021-05-25 LAB — CBC WITH DIFFERENTIAL/PLATELET
Abs Immature Granulocytes: 0.05 10*3/uL (ref 0.00–0.07)
Basophils Absolute: 0.1 10*3/uL (ref 0.0–0.1)
Basophils Relative: 1 %
Eosinophils Absolute: 0.1 10*3/uL (ref 0.0–0.5)
Eosinophils Relative: 2 %
HCT: 23.8 % — ABNORMAL LOW (ref 36.0–46.0)
Hemoglobin: 7.5 g/dL — ABNORMAL LOW (ref 12.0–15.0)
Immature Granulocytes: 1 %
Lymphocytes Relative: 27 %
Lymphs Abs: 1.9 10*3/uL (ref 0.7–4.0)
MCH: 30.6 pg (ref 26.0–34.0)
MCHC: 31.5 g/dL (ref 30.0–36.0)
MCV: 97.1 fL (ref 80.0–100.0)
Monocytes Absolute: 1 10*3/uL (ref 0.1–1.0)
Monocytes Relative: 15 %
Neutro Abs: 3.9 10*3/uL (ref 1.7–7.7)
Neutrophils Relative %: 54 %
Platelets: 360 10*3/uL (ref 150–400)
RBC: 2.45 MIL/uL — ABNORMAL LOW (ref 3.87–5.11)
RDW: 15.9 % — ABNORMAL HIGH (ref 11.5–15.5)
WBC: 7 10*3/uL (ref 4.0–10.5)
nRBC: 1.4 % — ABNORMAL HIGH (ref 0.0–0.2)

## 2021-05-25 LAB — COMPREHENSIVE METABOLIC PANEL
ALT: 272 U/L — ABNORMAL HIGH (ref 0–44)
AST: 974 U/L — ABNORMAL HIGH (ref 15–41)
Albumin: 1.5 g/dL — ABNORMAL LOW (ref 3.5–5.0)
Alkaline Phosphatase: 447 U/L — ABNORMAL HIGH (ref 38–126)
Anion gap: 7 (ref 5–15)
BUN: 27 mg/dL — ABNORMAL HIGH (ref 8–23)
CO2: 20 mmol/L — ABNORMAL LOW (ref 22–32)
Calcium: 9.9 mg/dL (ref 8.9–10.3)
Chloride: 107 mmol/L (ref 98–111)
Creatinine, Ser: 1.42 mg/dL — ABNORMAL HIGH (ref 0.44–1.00)
GFR, Estimated: 38 mL/min — ABNORMAL LOW (ref >60)
Glucose, Bld: 94 mg/dL (ref 70–99)
Potassium: 4.8 mmol/L (ref 3.5–5.1)
Sodium: 134 mmol/L — ABNORMAL LOW (ref 135–145)
Total Bilirubin: 3.2 mg/dL — ABNORMAL HIGH (ref 0.3–1.2)
Total Protein: 7.7 g/dL (ref 6.5–8.1)

## 2021-05-25 LAB — BRAIN NATRIURETIC PEPTIDE: B Natriuretic Peptide: 55.1 pg/mL (ref 0.0–100.0)

## 2021-05-25 LAB — LIPASE, BLOOD: Lipase: 120 U/L — ABNORMAL HIGH (ref 11–51)

## 2021-05-25 LAB — MAGNESIUM: Magnesium: 2.5 mg/dL — ABNORMAL HIGH (ref 1.7–2.4)

## 2021-05-25 MED ORDER — TRAMADOL HCL 50 MG PO TABS
50.0000 mg | ORAL_TABLET | Freq: Every day | ORAL | Status: DC | PRN
  Administered 2021-05-26 – 2021-05-27 (×3): 50 mg via ORAL
  Filled 2021-05-25 (×3): qty 1

## 2021-05-25 MED ORDER — TRAZODONE HCL 50 MG PO TABS
50.0000 mg | ORAL_TABLET | Freq: Every evening | ORAL | Status: DC | PRN
  Administered 2021-05-25 – 2021-05-27 (×3): 50 mg via ORAL
  Filled 2021-05-25 (×4): qty 1

## 2021-05-25 MED ORDER — DM-GUAIFENESIN ER 30-600 MG PO TB12: 1.0000 | ORAL_TABLET | Freq: Two times a day (BID) | ORAL | Status: DC | PRN

## 2021-05-25 MED ORDER — FUROSEMIDE 10 MG/ML IJ SOLN
40.0000 mg | Freq: Once | INTRAMUSCULAR | Status: AC
  Administered 2021-05-25: 40 mg via INTRAVENOUS
  Filled 2021-05-25: qty 4

## 2021-05-25 MED ORDER — OXYCODONE HCL 5 MG PO TABS
5.0000 mg | ORAL_TABLET | ORAL | Status: DC | PRN
  Administered 2021-05-25 – 2021-05-30 (×10): 5 mg via ORAL
  Filled 2021-05-25 (×10): qty 1

## 2021-05-25 MED ORDER — HYDRALAZINE HCL 20 MG/ML IJ SOLN: 10.0000 mg | INTRAMUSCULAR | Status: DC | PRN

## 2021-05-25 MED ORDER — METOPROLOL TARTRATE 5 MG/5ML IV SOLN: 5.0000 mg | INTRAVENOUS | Status: DC | PRN

## 2021-05-25 MED ORDER — IPRATROPIUM-ALBUTEROL 0.5-2.5 (3) MG/3ML IN SOLN: 3.0000 mL | RESPIRATORY_TRACT | Status: DC | PRN

## 2021-05-25 MED ORDER — SENNOSIDES-DOCUSATE SODIUM 8.6-50 MG PO TABS
1.0000 | ORAL_TABLET | Freq: Every evening | ORAL | Status: DC | PRN
Start: 2021-05-25 — End: 2021-05-30

## 2021-05-25 MED ORDER — GADOBUTROL 1 MMOL/ML IV SOLN
7.0000 mL | Freq: Once | INTRAVENOUS | Status: AC | PRN
  Administered 2021-05-25: 7 mL via INTRAVENOUS

## 2021-05-25 NOTE — Progress Notes (Signed)
?PROGRESS NOTE ? ? ? ?Danielle Zamora  KGM:[id number] DOB: 30-Jul-1945 DOA: 05/24/2021 ?PCP: Pleas Koch, NP  ? ?Brief Narrative:  ?77 year old African-American female with history of HTN, CKD stage IIIb, anemia of chronic disease, pernicious anemia, hypothyroidism recently discharged to skilled nursing home after spending several days at [facility] after sustaining mechanical fall complicated by acute kidney injury on 5/5.  At the facility she was noted to have elevated LFTs which was confirmed on the laboratory care.  Right upper quadrant ultrasound showed gallstones but no evidence of acute cholecystitis.  MRCP showed gallstones but no evidence of ductal dilation, mild pancreatitis.  GI and general surgery consulted. ? ? ?Assessment & Plan: ? Principal Problem: ?  Elevated LFTs ?Active Problems: ?  Cholelithiasis ?  Essential hypertension ?  Stage 3b chronic kidney disease ([place]) ?  Anemia due to chronic kidney disease ?  ? ? ?Assessment and Plan: ?* Elevated LFTs ?Mildly elevated lipase suggestive of acute pancreatitis ?-Right upper quadrant ultrasound shows gallstones but no evidence of acute cholecystitis.  MRCP shows possible mild acute pancreatitis but no evidence of ductal dilation.  She may have recently passed a gallstone.  We will keep her n.p.o. for now, IV fluids, antiemetics as needed pain control ?- Consulted general surgery for possible lap cholecystectomy. ?- Acute hepatitis panel is negative ? ?Acute mild respiratory distress ?- Get chest x-ray, BNP.  Lasix 40 mg IV once.  As needed bronchodilators ? ?Cholelithiasis ?Has known gallstones on CT chest from 04/2021.  Management as mentioned above ? ?Anemia due to chronic kidney disease ?Stable.  Hemoglobin has drifted down today at 7.5 without evidence of acute bleeding.  Baseline is around 9.0. ? ?Stage 3b chronic kidney disease ([place]) ?Stable.  Creatinine baseline 1.4 ? ?Essential hypertension ?Stable.  Currently stable, will place on IV as needed  hydralazine and Lopressor ? ?Has history of chronic hypercalcemia with hyperparathyroidism ?Parathyroid adenoma ?- She is seen by [facility] endocrinology who is recommended outpatient parathyroid surgery. ? ? ? ?DVT prophylaxis: SCDs Start: 05/24/21 2023 ?Code Status: Full  ?Family Communication:  Called Son, Fannin ? ?Status is: Inpatient ?Remains inpatient appropriate because: Maintain hospital stay due to severe transaminitis. ? ? ?Subjective: ? ? ? ? ?Examination: ? ?General exam: Appears calm and comfortable  ?Respiratory system: bibasilar crackles. ?Cardiovascular system: S1 & S2 heard, RRR. No JVD, murmurs, rubs, gallops or clicks. No pedal edema. ?Gastrointestinal system: Abdomen is nondistended, soft, some epigastricenderness.   No organomegaly or masses felt. Normal bowel sounds heard. ?Central nervous system: Alert and oriented. No focal neurological deficits. ?Extremities: Symmetric 5 x 5 power. ?Skin: No rashes, lesions or ulcers ?Psychiatry: Judgement and insight appear normal. Mood & affect appropriate.  ? ? ? ?Objective: ?Vitals:  ? 05/25/21 0345 05/25/21 0400 05/25/21 0415 05/25/21 0504  ?BP: 100/61 111/65 111/77   ?Pulse:  97    ?Resp: '18 16 17   '$ ?Temp:    98 ?F (36.7 ?C)  ?TempSrc:    Oral  ?SpO2:  95%    ?Weight:      ?Height:      ? ?No intake or output data in the 24 hours ending 05/25/21 0742 ?Filed Weights  ? 05/24/21 1809  ?Weight: 81.6 kg  ? ? ? ?Data Reviewed:  ? ?CBC: ?Recent Labs  ?Lab 05/24/21 ?1830 05/25/21 ?0453  ?WBC 7.6 7.0  ?NEUTROABS 3.9 3.9  ?HGB 8.7* 7.5*  ?HCT 28.5* 23.8*  ?MCV 98.3 97.1  ?PLT 383 360  ? ?Basic Metabolic Panel: ?  Recent Labs  ?Lab 05/24/21 ?1830 05/25/21 ?0453  ?NA 133* 134*  ?K 5.0 4.8  ?CL 105 107  ?CO2 21* 20*  ?GLUCOSE 113* 94  ?BUN 24* 27*  ?CREATININE 1.45* 1.42*  ?CALCIUM 10.3 9.9  ?MG  --  2.5*  ? ?GFR: ?Estimated Creatinine Clearance: 34.1 mL/min (A) (by C-G formula based on SCr of 1.42 mg/dL (H)). ?Liver Function Tests: ?Recent Labs  ?Lab 05/24/21 ?1830  05/25/21 ?0453  ?AST 1,131* 974*  ?ALT 309* 272*  ?ALKPHOS 537* 447*  ?BILITOT 3.9* 3.2*  ?PROT 8.6* 7.7  ?ALBUMIN 1.8* 1.5*  ? ?Recent Labs  ?Lab 05/24/21 ?1830  ?LIPASE 138*  ? ?No results for input(s): AMMONIA in the last 168 hours. ?Coagulation Profile: ?Recent Labs  ?Lab 05/24/21 ?1830  ?INR 1.0  ? ?Cardiac Enzymes: ?No results for input(s): CKTOTAL, CKMB, CKMBINDEX, TROPONINI in the last 168 hours. ?BNP (last 3 results) ?No results for input(s): PROBNP in the last 8760 hours. ?HbA1C: ?No results for input(s): HGBA1C in the last 72 hours. ?CBG: ?No results for input(s): GLUCAP in the last 168 hours. ?Lipid Profile: ?No results for input(s): CHOL, HDL, LDLCALC, TRIG, CHOLHDL, LDLDIRECT in the last 72 hours. ?Thyroid Function Tests: ?No results for input(s): TSH, T4TOTAL, FREET4, T3FREE, THYROIDAB in the last 72 hours. ?Anemia Panel: ?No results for input(s): VITAMINB12, FOLATE, FERRITIN, TIBC, IRON, RETICCTPCT in the last 72 hours. ?Sepsis Labs: ?No results for input(s): PROCALCITON, LATICACIDVEN in the last 168 hours. ? ?No results found for this or any previous visit (from the past 240 hour(s)).  ? ? ? ? ? ?Radiology Studies: ?MR ABDOMEN MRCP W WO CONTAST ? ?Result Date: 05/25/2021 ?CLINICAL DATA:  Cholelithiasis EXAM: MRI ABDOMEN WITHOUT AND WITH CONTRAST (INCLUDING MRCP) TECHNIQUE: Multiplanar multisequence MR imaging of the abdomen was performed both before and after the administration of intravenous contrast. Heavily T2-weighted images of the biliary and pancreatic ducts were obtained, and three-dimensional MRCP images were rendered by post processing. CONTRAST:  12m GADAVIST GADOBUTROL 1 MMOL/ML IV SOLN COMPARISON:  Right upper quadrant ultrasound dated 05/24/2021 FINDINGS: Lower chest: Lung bases are clear. Hepatobiliary: Liver is within normal limits. No suspicious/enhancing hepatic lesions. Layering small gallstones (series 4/image 19), without gallbladder wall thickening or inflammatory changes. No  intrahepatic or extrahepatic ductal dilatation. Common duct measures 4 mm. No choledocholithiasis is seen. Pancreas: Very mild peripancreatic fluid/stranding along the pancreatic tail, suggesting mild acute pancreatitis. No pancreatic mass or ductal dilatation. Spleen:  Within normal limits. Adrenals/Urinary Tract: 2.8 cm left adrenal nodule with signal loss on opposed phase imaging, compatible with a benign adrenal adenoma. Right adrenal glands are within normal limits. Right renal cortical scarring/atrophy. Bilateral renal cysts, measuring up to 3.6 cm in the left upper kidney. No hydronephrosis. Stomach/Bowel: Stomach is within normal limits. Visualized bowel is unremarkable. Vascular/Lymphatic:  No evidence of abdominal aortic aneurysm. No suspicious abdominal lymphadenopathy. Other:  No abdominal ascites. Musculoskeletal: No focal osseous lesions. IMPRESSION: Cholelithiasis, without associated inflammatory changes to suggest acute cholecystitis. No intrahepatic or extrahepatic ductal dilatation. Common duct measures 4 mm. No choledocholithiasis is seen. Very mild peripancreatic fluid/stranding along the pancreatic tail, correlate for mild acute pancreatitis. Benign left adrenal adenoma. Electronically Signed   By: SJulian HyM.D.   On: 05/25/2021 01:32  ? ?UKoreaAbdomen Limited RUQ (LIVER/GB) ? ?Result Date: 05/24/2021 ?CLINICAL DATA:  Transaminitis EXAM: ULTRASOUND ABDOMEN LIMITED RIGHT UPPER QUADRANT COMPARISON:  None Available. FINDINGS: Gallbladder: 7 mm gallstone. No gallbladder wall thickening or pericholecystic fluid. Negative sonographic Murphy's sign.  Common bile duct: Diameter: 4 mm Liver: At the upper limits of normal for parenchymal echogenicity. No focal hepatic lesion is seen. Portal vein is patent on color Doppler imaging with normal direction of blood flow towards the liver. Other: None. IMPRESSION: Cholelithiasis, without associated inflammatory changes to suggest acute cholecystitis.  Electronically Signed   By: Julian Hy M.D.   On: 05/24/2021 20:37   ? ? ? ? ? ?Scheduled Meds: ? nicotine  14 mg Transdermal Daily  ? ?Continuous Infusions: ? sodium chloride 100 mL/hr at 05/25/21 0449  ? ? ? LOS:

## 2021-05-25 NOTE — ED Notes (Signed)
Patient transported to MRI 

## 2021-05-25 NOTE — Progress Notes (Signed)
MRCP negative for CBD stone.  Shows gallstones.  With elevated LFTS and mild elevated lipase, I wonder if she recently passed a obstructing stone?? ? ?acute hepatitis panel is negative. ? ?Kristopher Oppenheim, DO ?[facility] ?

## 2021-05-25 NOTE — ED Notes (Signed)
Pts linens and brief changed. Purewick applied. Pt repositioned in bed ?

## 2021-05-25 NOTE — Consult Note (Signed)
? ? ? ?Danielle Zamora ?12-Apr-1945  ?[id number].   ? ?Requesting MD: Dr. Gerlean Ren ?Chief Complaint/Reason for Consult: biliary pancreatitis ? ?HPI:  ?This is a 76 yo female with a history of HTN, CKD, anemia, pernicious anemia, hypothryoidism, who was recently admitted on 5/2 secondary to a fall with AKI.  She was discharged to [facility] on 5/5.   ?She has had some mild epigastric tenderness along with N/V for the last week.  The SNF routinely checks LFTs and these were noted to be elevated and she was therefore sent to the [facility] for evaluation.  She is noted to have a lipase of 138 and LFTS, with TB 3.9, alkphos 537, AST/ALT 309/1131.  WBC is normal.  She underwent an abdominal US which revealed cholelithiasis but no changes c/w cholecystitis.  She then underwent an MRCP which revealed the same findings as the [place], but no choledocholithiasis or intra and extrahepatic ductal dilatation.  She has mild peripancreatic stranding c/w mild acute pancreatitis.  She is being admitted to the medical service and have been asked to see her for further evaluation and recommendations. She is not on blood thinners. Prior tubal ligation. No other abdominal surgeries. Previously drank 1 pint of gin over the course of 3 days but has not drank alcohol since the fall on 5/2. She lives at home alone and walks with cane/walker at baseline. Reports she can walk ~63f before she gets tired/sob and needs to take a break.  ? ?ROS: ?ROS: Please see HPI ? ?Family History  ?Problem Relation Age of Onset  ? Kidney disease Mother   ? Breast cancer Sister   ? ? ?Past Medical History:  ?Diagnosis Date  ? Essential hypertension   ? Pernicious anemia   ? ? ?No past surgical history on file. ? ?Social History:  reports that she has been smoking cigarettes. She has a 42.00 pack-year smoking history. She has never used smokeless tobacco. She reports current alcohol use of about 2.0 standard drinks per week. She reports current drug use. Drug:  Marijuana. ? ?Allergies:  ?Allergies  ?Allergen Reactions  ? Penicillins Swelling  ?  Has patient had a PCN reaction causing immediate rash, facial/tongue/throat swelling, SOB or lightheadedness with hypotension: Yes ?Has patient had a PCN reaction causing severe rash involving mucus membranes or skin necrosis: Yes ?Has patient had a PCN reaction that required hospitalization: No ?Has patient had a PCN reaction occurring within the last 10 years: No ?If all of the above answers are "NO", then may proceed with Cephalosporin use. ?  ? ? ?(Not in a hospital admission) ? ? ? ?Physical Exam: ?Blood pressure 111/77, pulse 97, temperature 98 ?F (36.7 ?C), temperature source Oral, resp. rate 17, height '5\' 3"'$  (1.6 m), weight 81.6 kg, SpO2 95 %. ?General: pleasant, WD, WN female who is laying in bed in NAD ?HEENT: head is normocephalic, atraumatic.  Sclera icterus noted. PERRL.  Ears and nose without any masses or lesions.  Mouth is pink and moist. Edentulous.  ?Heart: regular, rate, and rhythm.  No obvious murmurs noted.  Palpable radial and pedal pulses bilaterally ?Lungs: CTAB, no wheezes, rhonchi, or rales noted.  Respiratory effort nonlabored ?Abd: Soft, ND, mild epigastric tenderness without peritonitis. +BS, no masses, hernias, or organomegaly ?MS: all 4 extremities are symmetrical with no cyanosis, clubbing, or edema. ?Skin: warm and dry with no masses, lesions, or rashes ?Neuro: Cranial nerves 3-12 grossly intact, sensation is normal throughout, MAE's ?Psych: A&Ox3 with an appropriate affect. ? ? ?  Results for orders placed or performed during the hospital encounter of 05/24/21 (from the past 48 hour(s))  ?Comprehensive metabolic panel     Status: Abnormal  ? Collection Time: 05/24/21  6:30 PM  ?Result Value Ref Range  ? Sodium 133 (L) 135 - 145 mmol/L  ? Potassium 5.0 3.5 - 5.1 mmol/L  ? Chloride 105 98 - 111 mmol/L  ? CO2 21 (L) 22 - 32 mmol/L  ? Glucose, Bld 113 (H) 70 - 99 mg/dL  ?  Comment: Glucose reference  range applies only to samples taken after fasting for at least 8 hours.  ? BUN 24 (H) 8 - 23 mg/dL  ? Creatinine, Ser 1.45 (H) 0.44 - 1.00 mg/dL  ? Calcium 10.3 8.9 - 10.3 mg/dL  ? Total Protein 8.6 (H) 6.5 - 8.1 g/dL  ? Albumin 1.8 (L) 3.5 - 5.0 g/dL  ? AST 1,131 (H) 15 - 41 U/L  ? ALT 309 (H) 0 - 44 U/L  ? Alkaline Phosphatase 537 (H) 38 - 126 U/L  ? Total Bilirubin 3.9 (H) 0.3 - 1.2 mg/dL  ? GFR, Estimated 37 (L) >60 mL/min  ?  Comment: (NOTE) ?Calculated using the CKD-EPI Creatinine Equation (2021) ?  ? Anion gap 7 5 - 15  ?  Comment: Performed at [facility], [place] [address], [place], [place] [address]  ?CBC with Differential     Status: Abnormal  ? Collection Time: 05/24/21  6:30 PM  ?Result Value Ref Range  ? WBC 7.6 4.0 - 10.5 K/uL  ? RBC 2.90 (L) 3.87 - 5.11 MIL/uL  ? Hemoglobin 8.7 (L) 12.0 - 15.0 g/dL  ? HCT 28.5 (L) 36.0 - 46.0 %  ? MCV 98.3 80.0 - 100.0 fL  ? MCH 30.0 26.0 - 34.0 pg  ? MCHC 30.5 30.0 - 36.0 g/dL  ? RDW 15.9 (H) 11.5 - 15.5 %  ? Platelets 383 150 - 400 K/uL  ? nRBC 0.9 (H) 0.0 - 0.2 %  ? Neutrophils Relative % 50 %  ? Neutro Abs 3.9 1.7 - 7.7 K/uL  ? Lymphocytes Relative 33 %  ? Lymphs Abs 2.5 0.7 - 4.0 K/uL  ? Monocytes Relative 14 %  ? Monocytes Absolute 1.0 0.1 - 1.0 K/uL  ? Eosinophils Relative 1 %  ? Eosinophils Absolute 0.1 0.0 - 0.5 K/uL  ? Basophils Relative 1 %  ? Basophils Absolute 0.1 0.0 - 0.1 K/uL  ? Immature Granulocytes 1 %  ? Abs Immature Granulocytes 0.04 0.00 - 0.07 K/uL  ?  Comment: Performed at [facility], [place] [address], [place], [place] [address]  ?Lipase, blood     Status: Abnormal  ? Collection Time: 05/24/21  6:30 PM  ?Result Value Ref Range  ? Lipase 138 (H) 11 - 51 U/L  ?  Comment: Performed at [facility], [place] [address] [place], [place] [address]  ?Ethanol     Status: None  ? Collection Time: 05/24/21  6:30 PM  ?Result Value Ref Range  ? Alcohol, Ethyl (B) <10 <10 mg/dL  ?  Comment: (NOTE) ?Lowest detectable limit for serum alcohol is 10  mg/dL. ? ?For medical purposes only. ?Performed at [facility], [place] [address], [place], [place] ?[address] ?  ?Protime-INR     Status: None  ? Collection Time: 05/24/21  6:30 PM  ?Result Value Ref Range  ? Prothrombin Time 13.5 11.4 - 15.2 seconds  ? INR 1.0 0.8 - 1.2  ?  Comment: (NOTE) ?INR goal varies based on device and disease states. ?Performed at [facility], [place] [address] [place], [place] ?[address] ?  ?Hepatitis panel, acute     Status: None  ? Collection Time: 05/24/21  7:42 PM  ?Result Value Ref Range  ? Hepatitis B Surface Ag NON REACTIVE NON REACTIVE  ? HCV Ab NON REACTIVE NON REACTIVE  ?  Comment: (NOTE) ?Nonreactive HCV antibody screen is consistent with no HCV infections,  ?unless recent infection is suspected or other evidence exists to ?indicate HCV infection. ? ?  ? Hep A IgM NON REACTIVE NON REACTIVE  ? Hep B C IgM NON REACTIVE NON REACTIVE  ?  Comment: Performed at [facility], [place] [address] [place], [place] [address]  ?Urinalysis, Routine w reflex microscopic Urine, Clean Catch     Status: Abnormal  ? Collection Time: 05/24/21 11:30 PM  ?Result Value Ref Range  ? Color, Urine AMBER (A) YELLOW  ?  Comment: BIOCHEMICALS MAY BE AFFECTED BY COLOR  ? APPearance CLEAR CLEAR  ? Specific Gravity, Urine 1.014 1.005 - 1.030  ? pH 6.0 5.0 - 8.0  ? Glucose, UA NEGATIVE NEGATIVE mg/dL  ? Hgb urine dipstick LARGE (A) NEGATIVE  ? Bilirubin Urine NEGATIVE NEGATIVE  ? Ketones, ur NEGATIVE NEGATIVE mg/dL  ? Protein, ur 100 (A) NEGATIVE mg/dL  ? Nitrite NEGATIVE NEGATIVE  ? Leukocytes,Ua NEGATIVE NEGATIVE  ? RBC / HPF 0-5 0 - 5 RBC/hpf  ? WBC, UA 0-5 0 - 5 WBC/hpf  ? Bacteria, UA RARE (A) NONE SEEN  ? Squamous Epithelial / LPF 0-5 0 - 5  ?  Comment: Performed at [facility], [place] [address] [place], [place] [address]  ?CBC with Differential/Platelet     Status: Abnormal  ? Collection Time: 05/25/21  4:53 AM  ?Result Value Ref Range  ? WBC 7.0 4.0 - 10.5 K/uL  ? RBC 2.45 (L) 3.87 -  5.11 MIL/uL  ? Hemoglobin 7.5 (L) 12.0 - 15.0 g/dL  ? HCT 23.8 (L) 36.0 - 46.0 %  ? MCV 97.1 80.0 - 100.0 fL  ? MCH 30.6 26.0 - 34.0 pg  ? MCHC 31.5 30.0 - 36.0 g/dL  ? RDW 15.9 (H) 11.5 - 15.5 %  ? Platelets

## 2021-05-26 ENCOUNTER — Inpatient Hospital Stay (HOSPITAL_COMMUNITY): Payer: Medicare Other

## 2021-05-26 DIAGNOSIS — N1832 Chronic kidney disease, stage 3b: Secondary | ICD-10-CM | POA: Diagnosis not present

## 2021-05-26 DIAGNOSIS — K8011 Calculus of gallbladder with chronic cholecystitis with obstruction: Secondary | ICD-10-CM | POA: Diagnosis not present

## 2021-05-26 DIAGNOSIS — R17 Unspecified jaundice: Secondary | ICD-10-CM | POA: Diagnosis not present

## 2021-05-26 DIAGNOSIS — R9431 Abnormal electrocardiogram [ECG] [EKG]: Secondary | ICD-10-CM

## 2021-05-26 DIAGNOSIS — D631 Anemia in chronic kidney disease: Secondary | ICD-10-CM

## 2021-05-26 DIAGNOSIS — R7989 Other specified abnormal findings of blood chemistry: Secondary | ICD-10-CM | POA: Diagnosis not present

## 2021-05-26 LAB — ECHOCARDIOGRAM COMPLETE
AR max vel: 2.78 cm2
AV Area VTI: 2.81 cm2
AV Area mean vel: 2.86 cm2
AV Mean grad: 5 mmHg
AV Peak grad: 9.9 mmHg
Ao pk vel: 1.57 m/s
Height: 63 in
S' Lateral: 3.1 cm
Weight: 2880 oz

## 2021-05-26 LAB — IRON AND TIBC
Iron: 92 ug/dL (ref 28–170)
Saturation Ratios: 37 % — ABNORMAL HIGH (ref 10.4–31.8)
TIBC: 252 ug/dL (ref 250–450)
UIBC: 160 ug/dL

## 2021-05-26 LAB — CBC
HCT: 26.6 % — ABNORMAL LOW (ref 36.0–46.0)
Hemoglobin: 8.2 g/dL — ABNORMAL LOW (ref 12.0–15.0)
MCH: 29.6 pg (ref 26.0–34.0)
MCHC: 30.8 g/dL (ref 30.0–36.0)
MCV: 96 fL (ref 80.0–100.0)
Platelets: 397 10*3/uL (ref 150–400)
RBC: 2.77 MIL/uL — ABNORMAL LOW (ref 3.87–5.11)
RDW: 16.3 % — ABNORMAL HIGH (ref 11.5–15.5)
WBC: 9.5 10*3/uL (ref 4.0–10.5)
nRBC: 1.8 % — ABNORMAL HIGH (ref 0.0–0.2)

## 2021-05-26 LAB — PROTIME-INR
INR: 1.2 (ref 0.8–1.2)
Prothrombin Time: 14.6 seconds (ref 11.4–15.2)

## 2021-05-26 LAB — COMPREHENSIVE METABOLIC PANEL
ALT: 299 U/L — ABNORMAL HIGH (ref 0–44)
AST: 1147 U/L — ABNORMAL HIGH (ref 15–41)
Albumin: 1.7 g/dL — ABNORMAL LOW (ref 3.5–5.0)
Alkaline Phosphatase: 426 U/L — ABNORMAL HIGH (ref 38–126)
Anion gap: 8 (ref 5–15)
BUN: 28 mg/dL — ABNORMAL HIGH (ref 8–23)
CO2: 19 mmol/L — ABNORMAL LOW (ref 22–32)
Calcium: 10.6 mg/dL — ABNORMAL HIGH (ref 8.9–10.3)
Chloride: 107 mmol/L (ref 98–111)
Creatinine, Ser: 1.45 mg/dL — ABNORMAL HIGH (ref 0.44–1.00)
GFR, Estimated: 37 mL/min — ABNORMAL LOW (ref >60)
Glucose, Bld: 127 mg/dL — ABNORMAL HIGH (ref 70–99)
Potassium: 4.6 mmol/L (ref 3.5–5.1)
Sodium: 134 mmol/L — ABNORMAL LOW (ref 135–145)
Total Bilirubin: 3.5 mg/dL — ABNORMAL HIGH (ref 0.3–1.2)
Total Protein: 8.6 g/dL — ABNORMAL HIGH (ref 6.5–8.1)

## 2021-05-26 LAB — MAGNESIUM: Magnesium: 2.3 mg/dL (ref 1.7–2.4)

## 2021-05-26 LAB — FERRITIN: Ferritin: 3986 ng/mL — ABNORMAL HIGH (ref 11–307)

## 2021-05-26 MED ORDER — LACTATED RINGERS IV SOLN: INTRAVENOUS | Status: DC

## 2021-05-26 NOTE — TOC Progression Note (Addendum)
Transition of Care (TOC) - Initial/Assessment Note  ? ? ?Patient Details  ?Name: Danielle Zamora ?MRN: [id number] ?Date of Birth: 18-May-1945 ? ?Transition of Care (TOC) CM/SW Contact:    ?Paulene Floor Jamilyn Pigeon, LCSWA ?Phone Number: ?05/26/2021, 3:53 PM ? ?Clinical Narrative:                 ?CSW contacted Sharyn Lull in admissions at [facility] and was informed that the patient was receiving ST rehab at the facility and can return when medically ready. ? ?TOC will continue to follow.  ? ?  ?  ? ? ?Patient Goals and CMS Choice ?  ?  ?  ? ?Expected Discharge Plan and Services ?  ?  ?  ?  ?  ?                ?  ?  ?  ?  ?  ?  ?  ?  ?  ?  ? ?Prior Living Arrangements/Services ?  ?  ?  ?       ?  ?  ?  ?  ? ?Activities of Daily Living ?Home Assistive Devices/Equipment: Shower chair without back, Walker (specify type) ?ADL Screening (condition at time of admission) ?Patient's cognitive ability adequate to safely complete daily activities?: Yes ?Is the patient deaf or have difficulty hearing?: No ?Does the patient have difficulty seeing, even when wearing glasses/contacts?: No ?Does the patient have difficulty concentrating, remembering, or making decisions?: No ?Patient able to express need for assistance with ADLs?: Yes ?Does the patient have difficulty dressing or bathing?: Yes ?Independently performs ADLs?: No ?Communication: Independent ?Dressing (OT): Needs assistance ?Is this a change from baseline?: Pre-admission baseline ?Grooming: Needs assistance ?Is this a change from baseline?: Pre-admission baseline ?Feeding: Needs assistance ?Is this a change from baseline?: Pre-admission baseline ?Bathing: Needs assistance ?Is this a change from baseline?: Pre-admission baseline ?Toileting: Needs assistance ?Is this a change from baseline?: Pre-admission baseline ?In/Out Bed: Needs assistance ?Is this a change from baseline?: Pre-admission baseline ?Walks in Home: Needs assistance ?Is this a change from baseline?: Pre-admission  baseline ?Does the patient have difficulty walking or climbing stairs?: Yes ?Weakness of Legs: Both ?Weakness of Arms/Hands: Both ? ?Permission Sought/Granted ?  ?  ?   ?   ?   ?   ? ?Emotional Assessment ?  ?  ?  ?  ?  ?  ? ?Admission diagnosis:  Elevated liver function tests [R79.89] ?Elevated LFTs [R79.89] ?Patient Active Problem List  ? Diagnosis Date Noted  ? Elevated LFTs 05/24/2021  ? Cholelithiasis 05/24/2021  ? Anemia due to chronic kidney disease 05/11/2021  ? Aortic atherosclerosis ([place]) 02/24/2021  ? 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 Hyperparathyroidism due to renal insufficiency ([place]) 06/25/2020  ? Stage 3b chronic kidney disease ([place]) 06/25/2020  ? Unspecified abnormal findings in urine 06/25/2020  ? Gout 11/25/2019  ? Hyperlipidemia 11/25/2019  ? Osteoporosis 01/21/2019  ? Vitamin D deficiency 01/21/2019  ? Tobacco abuse 01/21/2019  ? Prediabetes 01/21/2019  ? CKD (chronic kidney disease) stage 4, GFR 15-29 ml/min (HCC) 10/09/2017  ? Hyperparathyroidism ([place]) 10/09/2017  ? Essential hypertension 08/31/2016  ? Pernicious anemia 08/11/2016  ? ?PCP:  Pleas Koch, NP ?Pharmacy:   ?[facility] [place] [address] ?[place] ?[place] [address] ?Phone: [phone] Fax: [phone] ? ? ? ? ?Social Determinants of Health (SDOH) Interventions ?  ? ?Readmission Risk  Interventions ?   ? View : No data to display.  ?  ?  ?  ? ? ? ?

## 2021-05-26 NOTE — Progress Notes (Signed)
Echocardiogram ?2D Echocardiogram has been performed. ? Darlina Sicilian M ?05/26/2021, 1:00 PM ?

## 2021-05-26 NOTE — Hospital Course (Addendum)
HTN, CKD stage IIIb, anemia of chronic disease, pernicious anemia, hypothyroidism recently discharged to skilled nursing home after spending several days at [facility] after sustaining mechanical fall complicated by acute kidney injury on 5/5.  At the facility she was noted to have elevated LFTs which was confirmed on the laboratory care.  Right upper quadrant ultrasound showed gallstones but no evidence of acute cholecystitis.  MRCP showed gallstones but no evidence of ductal dilation, mild pancreatitis.  GI and general surgery consulted.  MRCP with possible mild acute pancreatitis but no evidence of ductal dilatation, may have recently passed a gallstone.  Patient managed with IV fluids, pain medication.  Acute hepatitis respiratory panel negative.  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  Patient had anemia needing 1 unit blood transfusion anemia panel shows anemia of chronic disease ferritin elevated.  Patient has had neck pain cervical spine shows arthritis being managed symptomatically, followed by PT OT recommending skilled nursing facility

## 2021-05-26 NOTE — Consult Note (Addendum)
? ? ? Attending physician's note  ? ?I have taken a history, reviewed the chart, and examined the patient. I performed a substantive portion of this encounter, including complete performance of at least one of the key components, in conjunction with the APP. I agree with the APP's note, impression, and recommendations with my edits.  ? ?76 year old female with medical history as outlined below presents with abdominal pain, nausea/vomiting and noted to have elevated liver enzymes on arrival.  Admission evaluation notable for the following: ? ?- AST/ALT 1131/309 --> 974/272 --> 1147/299 ?- T. bili 3.9 --> 3.2 --> 3.5 ?- ALP 537 --> 447 --> 426 ?- H/H 8.2/26 (baseline Hgb ~11-12) ?- Lipase 138 --> 120 ?- Negative viral hepatitis panel ?- Negative EtOH ?- BUN/creatinine 28/1.45 ?- INR 1 ?- RUQ [place] (5/15): Cholelithiasis without cholecystitis.  CBD 4 mm ?- MRCP (5/16): Cholelithiasis without cholecystitis.  CBD 4 mm without CDL.  Very mild peripancreatic fluid stranding along pancreatic tail ? ?Comparison labs from 02/24/2021 with normal liver enzymes (AST/ALT 17/7, ALP 91, T. bili 0.9) ? ? ?1) Elevated liver enzymes (AST predominant) ?2) Elevated bilirubin ?3) Elevated alkaline phosphatase ?4) Abdominal pain ?5) Nausea/Vomiting ?6) Mild pancreatitis ? ?- Trend liver enzymes ?- Will perform extended serologic work-up to evaluate for concomitant liver disease.  Make sure to also include HSV, CMV, EBV given possibility of associated viral prodrome ?- Interestingly iron studies earlier this month with iron saturation 65%.  No ferritin was performed.  Repeat iron panel with ferritin ?- Was seen by the General Surgery service.  Holding off on ccy with IOC at this juncture ?- No role for ERCP at this juncture ? ?6) Anemia of chronic disease ?- No e/o overt bleeding.  H/H stable but down from baseline ?- Rechecking iron panel as above.  Can check B12 and folate ? ?7) Hypoalbuminemia ?- Albumin 1.7.  Was normal (3.8) in February.   Monitoring ? ?GI service will continue to follow ? ?[address] DO, FACG ?([phone] office  ? ?   ? ? ? ? Consultation ? ?Referring Provider:  Dr. Antonieta Pert    ?Primary Care Physician:  Pleas Koch, NP ?Primary Gastroenterologist:  Althia Forts       ?Reason for Consultation:  Elevated LFT's, abdominal pain with nausea and vomiting ?       ? HPI:   ?Danielle Zamora is a 76 y.o. female with a past medical history as listed below including CKD stage III, anemia of chronic kidney disease and history of pernicious anemia as well as hyperparathyroidism, who presented to the hospital on 05/24/2021 from her skilled nursing facility with elevated LFTs, abdominal pain and nausea and vomiting. ?   At time of arrival noted that patient had lab work performed at the facility routinely and they showed elevated LFTs.  LFTs in the ER were AST 1131, ALT 309, alk phos 537, total bili 3.9, lipase minimally elevated at 138, patient was not having any symptoms.  Hemoglobin was 8.7 ( week before 9.4).  Patient had a right upper quadrant ultrasound ordered.  Did discuss some epigastric tenderness and nausea and vomiting for the past week. ?   05/24/2021 right upper quadrant ultrasound with cholelithiasis and no associated inflammatory changes to suggest acute cholecystitis. ?   05/25/2021 MRCP with cholelithiasis without associated inflammatory changes to suggest acute cholecystitis and no ductal dilation.  Very mild peripancreatic fluid/stranding along the pancreatic tail.  Benign left adrenal adenoma. ?   05/26/2021 LFTs with  an AST of 1147, ALT 299, alk phos 426, total bilirubin 3.5. ?   Today, the patient is a poor historian.  She tells me that when she was in high school there was "something going on with my liver", but cannot remember exactly what other than that she experienced pain on her right side for period of time.  Since then has not really had any problems until about a week ago when she started with "unbearable  pain" in her right upper quadrant that was accompanied by nausea and vomiting.  This continued for about a week, but over the past 24 hours her pain is much better and in fact she tells me she does not have any at the moment and her nausea and vomiting has resolved. ?   Denies fever, chills, weight loss, change in bowel habits, heartburn, reflux, history of liver disease, family history of liver disease, previous IV drug use, supplement use or recent change/addition of medications. ? ?GI history: ?None ? ?Past Medical History:  ?Diagnosis Date  ? Essential hypertension   ? Pernicious anemia   ? ? ?History reviewed. No pertinent surgical history. ? ?Family History  ?Problem Relation Age of Onset  ? Kidney disease Mother   ? Breast cancer Sister   ? ? ?Social History  ? ?Tobacco Use  ? Smoking status: Every Day  ?  Packs/day: 0.75  ?  Years: 56.00  ?  Pack years: 42.00  ?  Types: Cigarettes  ? Smokeless tobacco: Never  ?Vaping Use  ? Vaping Use: Never used  ?Substance Use Topics  ? Alcohol use: Not Currently  ?  Comment: see note has been at assisted living facility  ? Drug use: Not Currently  ?  Types: Marijuana  ?  Comment: see note has been at assisted living facility  ? ? ?Prior to Admission medications   ?Medication Sig Start Date End Date Taking? Authorizing Provider  ?atorvastatin (LIPITOR) 40 MG tablet Take 1 tablet (40 mg total) by mouth every evening. for cholesterol. 04/23/21  Yes Pleas Koch, NP  ?colchicine 0.6 MG tablet Take 2 tablets by mouth at gout flare onset, take 1 additional tablet 2 hours later.  Then take 1 tablet twice daily until gout flare resolves. ?Patient taking differently: Take 0.6-1.2 mg by mouth See admin instructions. Take 2 tablets by mouth at gout flare onset, take 1 additional tablet 2 hours later.  Then take 1 tablet twice daily until gout flare resolves. 04/21/21  Yes Pleas Koch, NP  ?Magnesium 500 MG TABS Take 500 mg by mouth daily.   Yes [provider]   ?nicotine (NICODERM CQ - DOSED IN MG/24 HOURS) 14 mg/24hr patch Place 1 patch (14 mg total) onto the skin daily. 05/14/21  Yes Fritzi Mandes, MD  ?raloxifene (EVISTA) 60 MG tablet Take 60 mg by mouth daily. 01/19/21  Yes [provider]  ?traMADol (ULTRAM) 50 MG tablet Take 50 mg by mouth daily as needed for moderate pain. 05/24/21  Yes [provider]  ? ? ?Current Facility-Administered Medications  ?Medication Dose Route Frequency Provider Last Rate Last Admin  ? dextromethorphan-guaiFENesin (MUCINEX DM) 30-600 MG per 12 hr tablet 1 tablet  1 tablet Oral BID PRN Amin, Ankit Chirag, MD      ? hydrALAZINE (APRESOLINE) injection 10 mg  10 mg Intravenous Q4H PRN Amin, Ankit Chirag, MD      ? ipratropium-albuterol (DUONEB) 0.5-2.5 (3) MG/3ML nebulizer solution 3 mL  3 mL Nebulization Q4H PRN  Amin, Jeanella Flattery, MD      ? lactated ringers infusion   Intravenous Continuous Antonieta Pert, MD 75 mL/hr at 05/26/21 1136 New Bag at 05/26/21 1136  ? metoprolol tartrate (LOPRESSOR) injection 5 mg  5 mg Intravenous Q4H PRN Amin, Ankit Chirag, MD      ? nicotine (NICODERM CQ - dosed in mg/24 hours) patch 14 mg  14 mg Transdermal Daily Kristopher Oppenheim, DO   14 mg at 05/26/21 1002  ? ondansetron (ZOFRAN) tablet 4 mg  4 mg Oral Q6H PRN Kristopher Oppenheim, DO      ? Or  ? ondansetron (ZOFRAN) injection 4 mg  4 mg Intravenous Q6H PRN Kristopher Oppenheim, DO      ? oxyCODONE (Oxy IR/ROXICODONE) immediate release tablet 5 mg  5 mg Oral Q4H PRN Damita Lack, MD   5 mg at 05/26/21 0425  ? senna-docusate (Senokot-S) tablet 1 tablet  1 tablet Oral QHS PRN Amin, Ankit Chirag, MD      ? traMADol (ULTRAM) tablet 50 mg  50 mg Oral Daily PRN Damita Lack, MD   50 mg at 05/26/21 0647  ? traZODone (DESYREL) tablet 50 mg  50 mg Oral QHS PRN Damita Lack, MD   50 mg at 05/25/21 2152  ? ? ?Allergies as of 05/24/2021 - Review Complete 05/24/2021  ?Allergen Reaction Noted  ? Penicillins Swelling 08/10/2016  ? ? ? ?Review of Systems:     ?Constitutional: No weight loss, fever or chills ?Skin: No rash  ?Cardiovascular: No chest pain  ?Respiratory: No SOB ?Gastrointestinal: See HPI and otherwise negative ?Genitourinary: No dysuria  ?Neurological: N

## 2021-05-26 NOTE — Progress Notes (Signed)
? ? ?   ?Subjective: ?Patient laying in bed and says she hurts because of how she is laying.  Denies nausea, denies abdominal pain . ? ?ROS: See above, otherwise other systems negative ? ?Objective: ?Vital signs in last 24 hours: ?Temp:  [98.5 ?F (36.9 ?C)-100.4 ?F (38 ?C)] 99.1 ?F (37.3 ?C) (05/17 1017) ?Pulse Rate:  [95-105] 105 (05/17 0625) ?Resp:  [16-18] 18 (05/17 5102) ?BP: (108-144)/(65-88) 144/88 (05/17 5852) ?SpO2:  [90 %-97 %] 90 % (05/17 0625) ?  ? ?Intake/Output from previous day: ?05/16 0701 - 05/17 0700 ?In: 1596.8 [P.O.:610; I.V.:986.8] ?Out: 1150 [Urine:1150] ?Intake/Output this shift: ?No intake/output data recorded. ? ?PE: ?Gen: NAD ?Heart: irregular ?Lungs: CTAB ?Abd: soft, NT, ND, obese ? ?Lab Results:  ?Recent Labs  ?  05/25/21 ?7782 05/26/21 ?4235  ?WBC 7.0 9.5  ?HGB 7.5* 8.2*  ?HCT 23.8* 26.6*  ?PLT 360 397  ? ?BMET ?Recent Labs  ?  05/25/21 ?0453 05/26/21 ?3614  ?NA 134* 134*  ?K 4.8 4.6  ?CL 107 107  ?CO2 20* 19*  ?GLUCOSE 94 127*  ?BUN 27* 28*  ?CREATININE 1.42* 1.45*  ?CALCIUM 9.9 10.6*  ? ?PT/INR ?Recent Labs  ?  05/24/21 ?1830  ?LABPROT 13.5  ?INR 1.0  ? ?CMP  ?   ?Component Value Date/Time  ? NA 134 (L) 05/26/2021 0546  ? K 4.6 05/26/2021 0546  ? CL 107 05/26/2021 0546  ? CO2 19 (L) 05/26/2021 0546  ? GLUCOSE 127 (H) 05/26/2021 0546  ? BUN 28 (H) 05/26/2021 0546  ? CREATININE 1.45 (H) 05/26/2021 0546  ? CALCIUM 10.6 (H) 05/26/2021 0546  ? PROT 8.6 (H) 05/26/2021 0546  ? ALBUMIN 1.7 (L) 05/26/2021 0546  ? AST 1,147 (H) 05/26/2021 0546  ? ALT 299 (H) 05/26/2021 0546  ? ALKPHOS 426 (H) 05/26/2021 0546  ? BILITOT 3.5 (H) 05/26/2021 0546  ? GFRNONAA 37 (L) 05/26/2021 0546  ? GFRAA >60 08/11/2016 1432  ? ?Lipase  ?   ?Component Value Date/Time  ? LIPASE 120 (H) 05/25/2021 0453  ? ? ? ? ? ?Studies/Results: ?DG Chest Port 1 View ? ?Result Date: 05/25/2021 ?CLINICAL DATA:  Dyspnea. EXAM: PORTABLE CHEST 1 VIEW COMPARISON:  August 10, 2016. FINDINGS: Stable cardiomegaly. Both lungs are clear. The  visualized skeletal structures are unremarkable. IMPRESSION: No active disease. Electronically Signed   By: Marijo Conception M.D.   On: 05/25/2021 11:35  ? ?MR ABDOMEN MRCP W WO CONTAST ? ?Result Date: 05/25/2021 ?CLINICAL DATA:  Cholelithiasis EXAM: MRI ABDOMEN WITHOUT AND WITH CONTRAST (INCLUDING MRCP) TECHNIQUE: Multiplanar multisequence MR imaging of the abdomen was performed both before and after the administration of intravenous contrast. Heavily T2-weighted images of the biliary and pancreatic ducts were obtained, and three-dimensional MRCP images were rendered by post processing. CONTRAST:  64m GADAVIST GADOBUTROL 1 MMOL/ML IV SOLN COMPARISON:  Right upper quadrant ultrasound dated 05/24/2021 FINDINGS: Lower chest: Lung bases are clear. Hepatobiliary: Liver is within normal limits. No suspicious/enhancing hepatic lesions. Layering small gallstones (series 4/image 19), without gallbladder wall thickening or inflammatory changes. No intrahepatic or extrahepatic ductal dilatation. Common duct measures 4 mm. No choledocholithiasis is seen. Pancreas: Very mild peripancreatic fluid/stranding along the pancreatic tail, suggesting mild acute pancreatitis. No pancreatic mass or ductal dilatation. Spleen:  Within normal limits. Adrenals/Urinary Tract: 2.8 cm left adrenal nodule with signal loss on opposed phase imaging, compatible with a benign adrenal adenoma. Right adrenal glands are within normal limits. Right renal cortical scarring/atrophy. Bilateral renal cysts, measuring up to 3.6  cm in the left upper kidney. No hydronephrosis. Stomach/Bowel: Stomach is within normal limits. Visualized bowel is unremarkable. Vascular/Lymphatic:  No evidence of abdominal aortic aneurysm. No suspicious abdominal lymphadenopathy. Other:  No abdominal ascites. Musculoskeletal: No focal osseous lesions. IMPRESSION: Cholelithiasis, without associated inflammatory changes to suggest acute cholecystitis. No intrahepatic or extrahepatic  ductal dilatation. Common duct measures 4 mm. No choledocholithiasis is seen. Very mild peripancreatic fluid/stranding along the pancreatic tail, correlate for mild acute pancreatitis. Benign left adrenal adenoma. Electronically Signed   By: Julian Hy M.D.   On: 05/25/2021 01:32  ? ?US Abdomen Limited RUQ (LIVER/GB) ? ?Result Date: 05/24/2021 ?CLINICAL DATA:  Transaminitis EXAM: ULTRASOUND ABDOMEN LIMITED RIGHT UPPER QUADRANT COMPARISON:  None Available. FINDINGS: Gallbladder: 7 mm gallstone. No gallbladder wall thickening or pericholecystic fluid. Negative sonographic Murphy's sign. Common bile duct: Diameter: 4 mm Liver: At the upper limits of normal for parenchymal echogenicity. No focal hepatic lesion is seen. Portal vein is patent on color Doppler imaging with normal direction of blood flow towards the liver. Other: None. IMPRESSION: Cholelithiasis, without associated inflammatory changes to suggest acute cholecystitis. Electronically Signed   By: Julian Hy M.D.   On: 05/24/2021 20:37   ? ?Anti-infectives: ?Anti-infectives (From admission, onward)  ? ? None  ? ?  ? ? ? ?Assessment/Plan ?Presumed biliary pancreatitis with worsening LFTs ?-lipase was never higher than 130s, this would be very mild for pancreatitis, but MRCP suggests some stranding around the pancreas ?-no choledocholithiasis was noted on MRCP, but LFTs are trending back up again today for unclear reasons unless she is passing another stone, although if so she is asymptomatic from it. ?-hepatitis panel is negative ?-would recommend GI evaluation given elevation of LFTs with no evidence of cholecystitis on her imaging prior to lap chole ?-discussed with primary service ? ?FEN - NPO/IVFs ?VTE - ok for chemical prophlyaxis from our standpoint ?ID - none currently needed ? ?Heart arrhythmia - EKG.  Unable to tell if this a sinus arrhythmia or new onset a fib.  Discussed with primary service ?HTN ?Pernicious anemia ? ?I reviewed  hospitalist notes, last 24 h vitals and pain scores, last 48 h intake and output, last 24 h labs and trends, and last 24 h imaging results. ? ? LOS: 2 days  ? ? ?Henreitta Cea , PA-C ?[facility] ?05/26/2021, 8:30 AM ?Please see Amion for pager number during day hours 7:00am-4:30pm or 7:00am -11:30am on weekends ? ?

## 2021-05-26 NOTE — Progress Notes (Signed)
?PROGRESS NOTE ?Danielle Zamora  YQI:[id number] DOB: 05/06/1945 DOA: 05/24/2021 ?PCP: Pleas Koch, NP  ? ?Brief Narrative/Hospital Course: ?HTN, CKD stage IIIb, anemia of chronic disease, pernicious anemia, hypothyroidism recently discharged to skilled nursing home after spending several days at [facility] after sustaining mechanical fall complicated by acute kidney injury on 5/5.  At the facility she was noted to have elevated LFTs which was confirmed on the laboratory care.  Right upper quadrant ultrasound showed gallstones but no evidence of acute cholecystitis.  MRCP showed gallstones but no evidence of ductal dilation, mild pancreatitis.  GI and general surgery consulted.  MRCP with possible mild acute pancreatitis but no evidence of ductal dilatation, may have recently passed a gallstone.  Patient managed with IV fluids, pain medication.  Acute hepatitis respiratory panel negative.  ?  ?Subjective: ?Seen and examined this morning, alert awake resting comfortably denies abdominal pain. ?Overnight fever 100.4, BP stable on room air ?Labs shows worsening AST, TB.  Creatinine stable at 1.4 WBC stable ? ?Assessment and Plan: ?Principal Problem: ?  Elevated LFTs ?Active Problems: ?  Cholelithiasis ?  Essential hypertension ?  Stage 3b chronic kidney disease ([place]) ?  Anemia due to chronic kidney disease ?  ?Elevated LFTs ?Cholelithiasis ?Mildly elevated lipase suggestive of acute pancreatitis: ?RUQ ultrasound gallstone no evidence of acute cholecystitis but MRCP showed mild acute pancreatitis no evidence of ductal dilatation, may have recently passed a gall stone.  Acute hepatitis panel negative.  General surgery following, this morning LFTs are worsening, requesting formal GI consultation before proceeding to lap cholecystectomy.  Continue to hold statin, keep n.p.o., IV fluids, holding chemical prophylaxis in case of surgery. ? ?Anemia due to chronic kidney disease ?Hemoglobin overall stable.  Monitor ?Recent Labs   ?Lab 05/24/21 ?1830 05/25/21 ?0453 05/26/21 ?3875  ?HGB 8.7* 7.5* 8.2*  ?HCT 28.5* 23.8* 26.6*  ?  ?Stage 3b chronic kidney disease: Creatinine stable at 1.4.  Monitor  ? ?Essential hypertension: Well-controlled.  Not on home meds currently. ?History of chronic hypercalcemia with hyperparathyroidism ?Parathyroid anemia: ?Seen at [facility] endocrinology recommended outpatient parathyroid surgery ? ?Coronary artery calcification on CT 04/23/2021: Patient has no chest pain, mobility limited at baseline.  Obtain echocardiogram.EKG with sinus tachycardia frequent PVC, RBBB present on previous EKG in 5/1.   ? 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 ?Class I Obesity:Patient's Body mass index is 31.89 kg/m?. : Will benefit with PCP follow-up, weight loss  healthy lifestyle and outpatient sleep evaluation. ? ? ?DVT prophylaxis: SCDs Start: 05/24/21 2023 ?Code Status:   Code Status: Full Code ?Family Communication: plan of care discussed with patient at bedside. ?Patient status is: Inpatient because of ongoing management of cholelithiasis/elevated LFTs ?Level of care: Med-Surg  ? ?Dispo: The patient is from: home ?           Anticipated disposition: home ? ?Mobility Assessment (last 72 hours)   ? ? Mobility Assessment   ? ? [place] Name 05/25/21 1359  ?  ?  ?  ?  ? Does patient have an order for bedrest or is patient medically unstable No - Continue assessment      ? What is the highest level of mobility based on the progressive mobility assessment? Level 3 (Stands with assist) - Balance while standing  and cannot march in place      ? Is the above level different from baseline mobility prior to current illness? Yes - Recommend PT order      ? ?  ?  ? ?  ?  ? ?  Objective: ?Vitals last 24 hrs: ?Vitals:  ? 05/25/21 2203 05/26/21 0200 05/26/21 0625 05/26/21 0853  ?BP: 117/83 113/81 (!) 144/88 131/84  ?Pulse: 95 (!) 101 (!) 105 88  ?Resp: '18 18 18 19  '$ ?Temp: 99 ?F (37.2 ?C) (!) 100.4 ?F (38 ?C)  99.1 ?F (37.3 ?C) 98.8 ?F (37.1 ?C)  ?TempSrc: Oral Oral Oral Oral  ?SpO2: 95% 94% 90% 92%  ?Weight:      ?Height:      ? ?Weight change:  ? ?Physical Examination: ?General exam: alert awake, on room air, appears older than stated age, weak appearing. ?HEENT:Oral mucosa moist, Ear/Nose WNL grossly, dentition normal. ?Respiratory system: bilaterally diminished BS, no use of accessory muscle ?Cardiovascular system: S1 & S2 +, No JVD. ?Gastrointestinal system: Abdomen soft,NT,ND, BS+ ?Nervous System:Alert, awake, moving extremities and grossly nonfocal ?Extremities: LE edema none,distal peripheral pulses palpable.  ?Skin: No rashes,no icterus. ?MSK: Normal muscle bulk,tone, power ? ?Medications reviewed:  ?Scheduled Meds: ? nicotine  14 mg Transdermal Daily  ? ?Continuous Infusions: ? ?  ?Diet Order   ? ?       ?  Diet NPO time specified  Diet effective midnight       ?  ? ?  ?  ? ?  ?  ? ?  ?  ?  ? ? ?Intake/Output Summary (Last 24 hours) at 05/26/2021 1016 ?Last data filed at 05/26/2021 0900 ?Gross per 24 hour  ?Intake 610 ml  ?Output 1650 ml  ?Net -1040 ml  ? ?Net IO Since Admission: -53.19 mL [05/26/21 1016]  ?Wt Readings from Last 3 Encounters:  ?06-07-2021 81.6 kg  ?05/11/21 88.5 kg  ?04/23/21 88 kg  ?  ? ?Unresulted Labs (From admission, onward)  ? ?  Start     Ordered  ? 05/27/21 0500  Lipase, blood  Tomorrow morning,   R       ? 05/26/21 0835  ? 05/26/21 0500  Comprehensive metabolic panel  Daily,   R     ? 05/25/21 0749  ? 05/26/21 0500  CBC  Daily,   R     ? 05/25/21 0749  ? 05/26/21 0500  Magnesium  Daily,   R     ? 05/25/21 0749  ? ?  ?  ? ?  ?Data Reviewed: I have personally reviewed following labs and imaging studies ?CBC: ?Recent Labs  ?Lab 2021/06/07 ?1830 05/25/21 ?0453 05/26/21 ?8099  ?WBC 7.6 7.0 9.5  ?NEUTROABS 3.9 3.9  --   ?HGB 8.7* 7.5* 8.2*  ?HCT 28.5* 23.8* 26.6*  ?MCV 98.3 97.1 96.0  ?PLT 383 360 397  ? ?Basic Metabolic Panel: ?Recent Labs  ?Lab Jun 07, 2021 ?1830 05/25/21 ?0453 05/26/21 ?8338  ?NA  133* 134* 134*  ?K 5.0 4.8 4.6  ?CL 105 107 107  ?CO2 21* 20* 19*  ?GLUCOSE 113* 94 127*  ?BUN 24* 27* 28*  ?CREATININE 1.45* 1.42* 1.45*  ?CALCIUM 10.3 9.9 10.6*  ?MG  --  2.5* 2.3  ? ?GFR: ?Estimated Creatinine Clearance: 33.4 mL/min (A) (by C-G formula based on SCr of 1.45 mg/dL (H)). ?Liver Function Tests: ?Recent Labs  ?Lab 06-07-2021 ?1830 05/25/21 ?0453 05/26/21 ?2505  ?AST 1,131* 974* 1,147*  ?ALT 309* 272* 299*  ?ALKPHOS 537* 447* 426*  ?BILITOT 3.9* 3.2* 3.5*  ?PROT 8.6* 7.7 8.6*  ?ALBUMIN 1.8* 1.5* 1.7*  ? ?Recent Labs  ?Lab 07-Jun-2021 ?1830 05/25/21 ?0453  ?LIPASE 138* 120*  ? ?No results for input(s): AMMONIA in the last 168 hours. ?Coagulation Profile: ?  Recent Labs  ?Lab 05/24/21 ?1830  ?INR 1.0  ? ?BNP (last 3 results) ?No results for input(s): PROBNP in the last 8760 hours. ?HbA1C: ?No results for input(s): HGBA1C in the last 72 hours. ?CBG: ?No results for input(s): GLUCAP in the last 168 hours. ?Lipid Profile: ?No results for input(s): CHOL, HDL, LDLCALC, TRIG, CHOLHDL, LDLDIRECT in the last 72 hours. ?Thyroid Function Tests: ?No results for input(s): TSH, T4TOTAL, FREET4, T3FREE, THYROIDAB in the last 72 hours. ?Sepsis Labs: ?No results for input(s): PROCALCITON, LATICACIDVEN in the last 168 hours. ? ?No results found for this or any previous visit (from the past 240 hour(s)).  ?Antimicrobials: ?Anti-infectives (From admission, onward)  ? ? None  ? ?  ? ?Culture/Microbiology ?   ?Component Value Date/Time  ? SDES  05/11/2021 1658  ?  URINE, CLEAN CATCH ?Performed at [facility], [address] [place], [place] [address] ?  ? SPECREQUEST  05/11/2021 1658  ?  NONE ?Performed at [facility], [address], [place], [place] [address] ?  ? CULT (A) 05/11/2021 1658  ?  <10,000 COLONIES/mL INSIGNIFICANT GROWTH ?Performed at [facility], [place] [address], [place], [place] [address] ?  ? REPTSTATUS 05/13/2021 FINAL 05/11/2021 1658  ?  ?Other culture-see note  ?Radiology  Studies: ?DG Chest Port 1 View ? ?Result Date: 05/25/2021 ?CLINICAL DATA:  Dyspnea. EXAM: PORTABLE CHEST 1 VIEW COMPARISON:  August 10, 2016. FINDINGS: Stable cardiomegaly. Both lungs are clear. The visualized skeletal

## 2021-05-27 ENCOUNTER — Inpatient Hospital Stay (HOSPITAL_COMMUNITY): Payer: Medicare Other

## 2021-05-27 DIAGNOSIS — R17 Unspecified jaundice: Secondary | ICD-10-CM | POA: Diagnosis not present

## 2021-05-27 DIAGNOSIS — R7989 Other specified abnormal findings of blood chemistry: Secondary | ICD-10-CM | POA: Diagnosis not present

## 2021-05-27 DIAGNOSIS — K8011 Calculus of gallbladder with chronic cholecystitis with obstruction: Secondary | ICD-10-CM | POA: Diagnosis not present

## 2021-05-27 LAB — ANA W/REFLEX IF POSITIVE: Anti Nuclear Antibody (ANA): NEGATIVE

## 2021-05-27 LAB — COMPREHENSIVE METABOLIC PANEL
ALT: 270 U/L — ABNORMAL HIGH (ref 0–44)
AST: 976 U/L — ABNORMAL HIGH (ref 15–41)
Albumin: 1.6 g/dL — ABNORMAL LOW (ref 3.5–5.0)
Alkaline Phosphatase: 334 U/L — ABNORMAL HIGH (ref 38–126)
Anion gap: 6 (ref 5–15)
BUN: 30 mg/dL — ABNORMAL HIGH (ref 8–23)
CO2: 19 mmol/L — ABNORMAL LOW (ref 22–32)
Calcium: 10.6 mg/dL — ABNORMAL HIGH (ref 8.9–10.3)
Chloride: 110 mmol/L (ref 98–111)
Creatinine, Ser: 1.35 mg/dL — ABNORMAL HIGH (ref 0.44–1.00)
GFR, Estimated: 41 mL/min — ABNORMAL LOW (ref >60)
Glucose, Bld: 114 mg/dL — ABNORMAL HIGH (ref 70–99)
Potassium: 4.5 mmol/L (ref 3.5–5.1)
Sodium: 135 mmol/L (ref 135–145)
Total Bilirubin: 3 mg/dL — ABNORMAL HIGH (ref 0.3–1.2)
Total Protein: 7.8 g/dL (ref 6.5–8.1)

## 2021-05-27 LAB — MAGNESIUM: Magnesium: 2.2 mg/dL (ref 1.7–2.4)

## 2021-05-27 LAB — CBC
HCT: 23.7 % — ABNORMAL LOW (ref 36.0–46.0)
Hemoglobin: 7.5 g/dL — ABNORMAL LOW (ref 12.0–15.0)
MCH: 31.1 pg (ref 26.0–34.0)
MCHC: 31.6 g/dL (ref 30.0–36.0)
MCV: 98.3 fL (ref 80.0–100.0)
Platelets: 335 10*3/uL (ref 150–400)
RBC: 2.41 MIL/uL — ABNORMAL LOW (ref 3.87–5.11)
RDW: 16.6 % — ABNORMAL HIGH (ref 11.5–15.5)
WBC: 9.2 10*3/uL (ref 4.0–10.5)
nRBC: 3.6 % — ABNORMAL HIGH (ref 0.0–0.2)

## 2021-05-27 LAB — LIPASE, BLOOD: Lipase: 73 U/L — ABNORMAL HIGH (ref 11–51)

## 2021-05-27 LAB — PATHOLOGIST SMEAR REVIEW

## 2021-05-27 LAB — MITOCHONDRIAL ANTIBODIES: Mitochondrial M2 Ab, IgG: <20 Units (ref 0.0–20.0)

## 2021-05-27 LAB — ANTI-SMOOTH MUSCLE ANTIBODY, IGG: F-Actin IgG: 77 Units — ABNORMAL HIGH (ref 0–19)

## 2021-05-27 LAB — ALPHA-1-ANTITRYPSIN: A-1 Antitrypsin, Ser: 204 mg/dL — ABNORMAL HIGH (ref 101–187)

## 2021-05-27 MED ORDER — TRAMADOL HCL 50 MG PO TABS: 50.0000 mg | ORAL_TABLET | Freq: Four times a day (QID) | ORAL | Status: DC | PRN

## 2021-05-27 MED ORDER — METHOCARBAMOL 500 MG PO TABS
500.0000 mg | ORAL_TABLET | Freq: Four times a day (QID) | ORAL | Status: DC | PRN
  Administered 2021-05-27: 500 mg via ORAL
  Filled 2021-05-27: qty 1

## 2021-05-27 MED ORDER — DICLOFENAC SODIUM 1 % EX GEL
2.0000 g | Freq: Three times a day (TID) | CUTANEOUS | Status: DC
  Administered 2021-05-27 – 2021-05-30 (×10): 2 g via TOPICAL
  Filled 2021-05-27: qty 100

## 2021-05-27 NOTE — Care Management Important Message (Signed)
Important Message  Patient Details  Name: Danielle Zamora MRN: [id number] Date of Birth: Jul 13, 1945   Medicare Important Message Given:  Yes     [name] Montine Circle 05/27/2021, 2:55 PM

## 2021-05-27 NOTE — Progress Notes (Signed)
Mobility Specialist: Progress Note   05/27/21 1530  Mobility  Activity Dangled on edge of bed  Level of Assistance Maximum assist, patient does 25-49%  Assistive Device None  Activity Response Tolerated poorly  $Mobility charge 1 Mobility   Pt received in the bed and agreeable to mobility. Pt required verbal cues as well as physical assist to sit EOB from supine as well as for balance when sitting. Pt with mod-heavy R lateral lean when sitting EOB requiring max encouragement and physical assist to correct posture. Pt assisted back to bed with help from RN. PT/OT order placed. Pt back in bed with call bell at her side. RN present in the room.   [facility] Thompson Mckim Mobility Specialist Mobility Specialist 5 North: [phone] Mobility Specialist 6 North: [phone]

## 2021-05-27 NOTE — Progress Notes (Signed)
Subjective: Laying in bed.  No pain.  Ate CLD ok yesterday.  ROS: See above, otherwise other systems negative  Objective: Vital signs in last 24 hours: Temp:  [98.2 F (36.8 C)] 98.2 F (36.8 C) (05/18 0351) Pulse Rate:  [75-101] 97 (05/18 0351) Resp:  [16-19] 16 (05/18 0351) BP: (100-119)/(60-75) 100/74 (05/18 0351) SpO2:  [95 %-100 %] 95 % (05/18 0351) Weight:  [84.1 kg] 84.1 kg (05/18 0355)    Intake/Output from previous day: 05/17 0701 - 05/18 0700 In: 1252.4 [I.V.:1252.4] Out: 1550 [Urine:1550] Intake/Output this shift: No intake/output data recorded.  PE: Gen: NAD Abd: soft, NT, ND, obese  Lab Results:  Recent Labs    05/26/21 0546 05/27/21 0132  WBC 9.5 9.2  HGB 8.2* 7.5*  HCT 26.6* 23.7*  PLT 397 335   BMET Recent Labs    05/26/21 0546 05/27/21 0132  NA 134* 135  K 4.6 4.5  CL 107 110  CO2 19* 19*  GLUCOSE 127* 114*  BUN 28* 30*  CREATININE 1.45* 1.35*  CALCIUM 10.6* 10.6*   PT/INR Recent Labs    05/24/21 1830 05/26/21 1408  LABPROT 13.5 14.6  INR 1.0 1.2   CMP     Component Value Date/Time   NA 135 05/27/2021 0132   K 4.5 05/27/2021 0132   CL 110 05/27/2021 0132   CO2 19 (L) 05/27/2021 0132   GLUCOSE 114 (H) 05/27/2021 0132   BUN 30 (H) 05/27/2021 0132   CREATININE 1.35 (H) 05/27/2021 0132   CALCIUM 10.6 (H) 05/27/2021 0132   PROT 7.8 05/27/2021 0132   ALBUMIN 1.6 (L) 05/27/2021 0132   AST 976 (H) 05/27/2021 0132   ALT 270 (H) 05/27/2021 0132   ALKPHOS 334 (H) 05/27/2021 0132   BILITOT 3.0 (H) 05/27/2021 0132   GFRNONAA 41 (L) 05/27/2021 0132   GFRAA >60 08/11/2016 1432   Lipase     Component Value Date/Time   LIPASE 73 (H) 05/27/2021 0132       Studies/Results: DG Chest Port 1 View  Result Date: 05/25/2021 CLINICAL DATA:  Dyspnea. EXAM: PORTABLE CHEST 1 VIEW COMPARISON:  August 10, 2016. FINDINGS: Stable cardiomegaly. Both lungs are clear. The visualized skeletal structures are unremarkable. IMPRESSION: No  active disease. Electronically Signed   By: Marijo Conception M.D.   On: 05/25/2021 11:35   ECHOCARDIOGRAM COMPLETE  Result Date: 05/26/2021    ECHOCARDIOGRAM REPORT   Patient Name:   Danielle Zamora Date of Exam: 05/26/2021 Medical Rec #:  [id number]     Height:       63.0 in Accession #:    [id number]    Weight:       180.0 lb Date of Birth:  Sep 09, 1945     BSA:          1.849 m Patient Age:    76 years      BP:           131/84 mmHg Patient Gender: F             HR:           88 bpm. Exam Location:  Inpatient Procedure: 2D Echo, Cardiac Doppler and Color Doppler Indications:    Abnormal ECG R94.31  History:        Patient has no prior history of Echocardiogram examinations.                 Chronic kidney stage stage IIIb, anemia chronic kidney disease,  history of pernicious anemia, history of hyperparathyroidism.  Sonographer:    Darlina Sicilian RDCS Referring Phys: [id number] [place] IMPRESSIONS  1. Left ventricular ejection fraction, by estimation, is >75%. The left ventricle has hyperdynamic function. The left ventricle has no regional wall motion abnormalities. Left ventricular diastolic function could not be evaluated.  2. Right ventricular systolic function is normal. The right ventricular size is normal.  3. The mitral valve was not well visualized. No evidence of mitral valve regurgitation. No evidence of mitral stenosis.  4. The aortic valve is tricuspid. There is mild calcification of the aortic valve. Aortic valve regurgitation is not visualized. Aortic valve sclerosis/calcification is present, without any evidence of aortic stenosis.  5. Aortic dilatation noted. There is mild dilatation of the ascending aorta, measuring 41 mm.  6. The inferior vena cava is normal in size with greater than 50% respiratory variability, suggesting right atrial pressure of 3 mmHg. Comparison(s): No prior Echocardiogram. Conclusion(s)/Recommendation(s): Hyperdynamic LV with LV cavity peak gradient 47 mmHg.  Consider high flow state vs hypovolemia. FINDINGS  Left Ventricle: LV cavity peak gradient 47 mmHg. Left ventricular ejection fraction, by estimation, is >75%. The left ventricle has hyperdynamic function. The left ventricle has no regional wall motion abnormalities. The left ventricular internal cavity  size was normal in size. Suboptimal image quality limits for assessment of left ventricular hypertrophy. Left ventricular diastolic function could not be evaluated due to nondiagnostic images. Left ventricular diastolic function could not be evaluated. Right Ventricle: The right ventricular size is normal. Right vetricular wall thickness was not well visualized. Right ventricular systolic function is normal. Left Atrium: Left atrial size was not well visualized. Right Atrium: Right atrial size was not well visualized. Pericardium: There is no evidence of pericardial effusion. Presence of epicardial fat layer. Mitral Valve: The mitral valve was not well visualized. No evidence of mitral valve regurgitation. No evidence of mitral valve stenosis. Tricuspid Valve: The tricuspid valve is not well visualized. Tricuspid valve regurgitation is trivial. No evidence of tricuspid stenosis. Aortic Valve: The aortic valve is tricuspid. There is mild calcification of the aortic valve. Aortic valve regurgitation is not visualized. Aortic valve sclerosis/calcification is present, without any evidence of aortic stenosis. Aortic valve mean gradient measures 5.0 mmHg. Aortic valve peak gradient measures 9.9 mmHg. Aortic valve area, by VTI measures 2.81 cm. Pulmonic Valve: The pulmonic valve was not well visualized. Pulmonic valve regurgitation is trivial. No evidence of pulmonic stenosis. Aorta: Aortic dilatation noted. There is mild dilatation of the ascending aorta, measuring 41 mm. Venous: The inferior vena cava is normal in size with greater than 50% respiratory variability, suggesting right atrial pressure of 3 mmHg. IAS/Shunts:  The interatrial septum was not well visualized.  LEFT VENTRICLE PLAX 2D LVIDd:         4.00 cm LVIDs:         3.10 cm LV PW:         1.00 cm LV IVS:        1.20 cm LVOT diam:     1.90 cm LV SV:         62 LV SV Index:   33 LVOT Area:     2.84 cm  RIGHT VENTRICLE RV S prime:     14.90 cm/s LEFT ATRIUM             Index LA diam:        2.70 cm 1.46 cm/m LA Vol (A2C):   33.7 ml 18.23 ml/m LA Vol (  A4C):   41.6 ml 22.50 ml/m LA Biplane Vol: 37.9 ml 20.50 ml/m  AORTIC VALVE AV Area (Vmax):    2.78 cm AV Area (Vmean):   2.86 cm AV Area (VTI):     2.81 cm AV Vmax:           157.00 cm/s AV Vmean:          102.000 cm/s AV VTI:            0.219 m AV Peak Grad:      9.9 mmHg AV Mean Grad:      5.0 mmHg LVOT Vmax:         154.00 cm/s LVOT Vmean:        103.000 cm/s LVOT VTI:          0.217 m LVOT/AV VTI ratio: 0.99  AORTA Ao Root diam: 3.00 cm Ao Asc diam:  4.10 cm  SHUNTS Systemic VTI:  0.22 m Systemic Diam: 1.90 cm Buford Dresser MD Electronically signed by Buford Dresser MD Signature Date/Time: 05/26/2021/8:01:33 PM    Final     Anti-infectives: Anti-infectives (From admission, onward)    None        Assessment/Plan Presumed biliary pancreatitis with worsening LFTs -lipase was never higher than 130s, this would be very mild for pancreatitis, but MRCP suggests some stranding around the pancreas -no choledocholithiasis was noted on MRCP, but LFTs are trending back up again today for unclear reasons unless she is passing another stone, although if so she is asymptomatic from it. -hepatitis panel is negative -GI work up in process. -LFTs slightly downtrending today -will continue to monitor for readiness/plans for lap chole. -this is a very odd presentation given the patient has never had symptoms and all of this was found incidentally on routine labs. -will continue to monitor  FEN - NPO/IVFs VTE - ok for chemical prophlyaxis from our standpoint ID - none currently needed  Heart  arrhythmia - sinus with PVCs and RBBB HTN Pernicious anemia  I reviewed hospitalist notes, consultant GI notes, last 24 h vitals and pain scores, last 48 h intake and output, last 24 h labs and trends, and last 24 h imaging results.   LOS: 3 days    Henreitta Cea , [facility] 05/27/2021, 9:12 AM Please see Amion for pager number during day hours 7:00am-4:30pm or 7:00am -11:30am on weekends

## 2021-05-27 NOTE — Progress Notes (Signed)
PROGRESS NOTE Danielle Zamora  ESP:[id number] DOB: Oct 27, 1945 DOA: 05/24/2021 PCP: Pleas Koch, NP   Brief Narrative/Hospital Course: HTN, CKD stage IIIb, anemia of chronic disease, pernicious anemia, hypothyroidism recently discharged to skilled nursing home after spending several days at [facility] after sustaining mechanical fall complicated by acute kidney injury on 5/5.  At the facility she was noted to have elevated LFTs which was confirmed on the laboratory care.  Right upper quadrant ultrasound showed gallstones but no evidence of acute cholecystitis.  MRCP showed gallstones but no evidence of ductal dilation, mild pancreatitis.  GI and general surgery consulted.  MRCP with possible mild acute pancreatitis but no evidence of ductal dilatation, may have recently passed a gallstone.  Patient managed with IV fluids, pain medication.  Acute hepatitis respiratory panel negative.    Subjective: Seen and examined this morning.Resting comfortably denies any abdomen pain nausea vomiting. Tolerated clear liquid diet. LFTs slightly downtrending.  Assessment and Plan: Principal Problem:   Elevated LFTs Active Problems:   Cholelithiasis   Essential hypertension   Stage 3b chronic kidney disease (HCC)   Anemia due to chronic kidney disease   Elevated bilirubin   Elevated LFTs Cholelithiasis Presumed acute biliary pancreatitis: RUQ ultrasound gallstone no evidence of acute cholecystitis but MRCP showed mild acute pancreatitis no evidence of ductal dilatation, may have recently passed a gall stone.  Acute hepatitis panel negative.  General and GI following.  LFTs slightly downtrending today, surgery monitoring closely for readiness/plan for lap cholecystectomy.  GI has initiated work-up ASMA, AMA.  Ferritin level elevated at 3986, PT/INR normal, alpha 1 antitrypsin elevated at 204, ANA negative.  Continue plan of care as per GI and surgery.  Anemia due to chronic kidney disease Hemoglobin stable.   Monitor Recent Labs  Lab 05/24/21 1830 05/25/21 0453 05/26/21 0546 05/27/21 0132  HGB 8.7* 7.5* 8.2* 7.5*  HCT 28.5* 23.8* 26.6* 23.7*     Stage 3b chronic kidney disease: Creatinine stable monitor. Recent Labs  Lab 05/24/21 1830 05/25/21 0453 05/26/21 0546 05/27/21 0132  BUN 24* 27* 28* 30*  CREATININE 1.45* 1.42* 1.45* 1.35*    Essential hypertension: Well-controlled.  Not on home meds currently.  History of chronic hypercalcemia with hyperparathyroidism Parathyroid anemia: Seen at [facility] endocrinology recommended outpatient parathyroid surgery.  Electrolytes are stable calcium is slightly on higher side 10.6 Recent Labs  Lab 05/24/21 1830 05/25/21 0453 05/26/21 0546 05/27/21 0132  K 5.0 4.8 4.6 4.5  CALCIUM 10.3 9.9 10.6* 10.6*  MG  --  2.5* 2.3 2.2    Coronary artery calcification on CT 04/23/2021: Patient has no chest pain, mobility limited at baseline.  Obtained echocardiogram-EF more than 63%, LV diastolic function could not be evaluated, no regional wall motion abnormalities..EKG with sinus tachycardia frequent PVC, RBBB present on previous EKG in 5/1.    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  DVT prophylaxis: SCDs Start: 05/24/21 2023 Code Status:   Code Status: Full Code Family Communication: plan of care discussed with patient at bedside. Patient status is: Inpatient because of ongoing management of cholelithiasis/elevated LFTs Level of care: Med-Surg   Dispo: The patient is from: home            Anticipated disposition: home once cleared by GI/surgery  Mobility Assessment (last 72 hours)     Mobility Assessment  [place] Name 05/25/21 1359           Does patient have an order for bedrest or is patient medically unstable No - Continue assessment        What is the highest level of mobility based on the progressive mobility assessment? Level 3 (Stands with assist) - Balance while standing  and cannot march in place       Is the above level different from baseline mobility prior to current illness? Yes - Recommend PT order                 Objective: Vitals last 24 hrs: Vitals:   05/26/21 1644 05/26/21 2112 05/27/21 0351 05/27/21 0355  BP: 113/75 119/60 100/74   Pulse: 75 (!) 101 97   Resp: '19 16 16   '$ Temp: 98.2 F (36.8 C) 98.2 F (36.8 C) 98.2 F (36.8 C)   TempSrc:   Oral   SpO2: 100% 98% 95%   Weight:    84.1 kg  Height:       Weight change:   Physical Examination:  General exam: AA,Ill appearing, older than stated age, weak appearing. HEENT:Oral mucosa moist, Ear/Nose WNL grossly, dentition normal. Respiratory system: bilaterally diminished,no use of accessory muscle Cardiovascular system: S1 & S2 +, No JVD,. Gastrointestinal system: Abdomen soft,NT,ND, BS+ Nervous System:Alert, awake, moving extremities and grossly nonfocal Extremities: edema neg,distal peripheral pulses palpable.  Skin: No rashes,no icterus. MSK: Normal muscle bulk,tone, power  Medications reviewed:  Scheduled Meds:  nicotine  14 mg Transdermal Daily   Continuous Infusions:  lactated ringers 75 mL/hr at 05/27/21 0353      Diet Order             Diet clear liquid Room service appropriate? Yes; Fluid consistency: Thin  Diet effective now                  Intake/Output Summary (Last 24 hours) at 05/27/2021 1126 Last data filed at 05/27/2021 1100 Gross per 24 hour  Intake 1552.41 ml  Output 1050 ml  Net 502.41 ml    Net IO Since Admission: 449.22 mL [05/27/21 1126]  Wt Readings from Last 3 Encounters:  05/27/21 84.1 kg  05/11/21 88.5 kg  04/23/21 88 kg     Unresulted Labs (From admission, onward)     Start     Ordered   05/27/21 1121  Hemochromatosis DNA-PCR(c282y,h63d)  Once,   R        05/27/21 1121   05/27/21 0132   Pathologist smear review  Once,   R        05/27/21 0132   05/26/21 1338  Mitochondrial antibodies  (Mitochondiral/Smooth Muscle AB PNL (PNL))  Once,   R        05/26/21 1343   05/26/21 1338  Anti-smooth muscle antibody, IgG  (Mitochondiral/Smooth Muscle AB PNL (PNL))  Once,   R        05/26/21 1343   05/26/21 0500  Comprehensive metabolic panel  Daily,   R      05/25/21 0749   05/26/21 0500  CBC  Daily,   R      05/25/21 0749   05/26/21 0500  Magnesium  Daily,   R      05/25/21 0749          Data Reviewed: I have personally reviewed following labs and imaging studies CBC: Recent Labs  Lab 05/24/21 1830 05/25/21 0453 05/26/21 0546 05/27/21 0132  WBC 7.6 7.0 9.5 9.2  NEUTROABS 3.9 3.9  --   --   HGB 8.7* 7.5* 8.2* 7.5*  HCT 28.5* 23.8* 26.6* 23.7*  MCV 98.3 97.1 96.0 98.3  PLT 383 360 397 093    Basic Metabolic Panel: Recent Labs  Lab 05/24/21 1830 05/25/21 0453 05/26/21 0546 05/27/21 0132  NA 133* 134* 134* 135  K 5.0 4.8 4.6 4.5  CL 105 107 107 110  CO2 21* 20* 19* 19*  GLUCOSE 113* 94 127* 114*  BUN 24* 27* 28* 30*  CREATININE 1.45* 1.42* 1.45* 1.35*  CALCIUM 10.3 9.9 10.6* 10.6*  MG  --  2.5* 2.3 2.2    GFR: Estimated Creatinine Clearance: 36.4 mL/min (A) (by C-G formula based on SCr of 1.35 mg/dL (H)). Liver Function Tests: Recent Labs  Lab 05/24/21 1830 05/25/21 0453 05/26/21 0546 05/27/21 0132  AST 1,131* 974* 1,147* 976*  ALT 309* 272* 299* 270*  ALKPHOS 537* 447* 426* 334*  BILITOT 3.9* 3.2* 3.5* 3.0*  PROT 8.6* 7.7 8.6* 7.8  ALBUMIN 1.8* 1.5* 1.7* 1.6*    Recent Labs  Lab 05/24/21 1830 05/25/21 0453 05/27/21 0132  LIPASE 138* 120* 73*    No results for input(s): AMMONIA in the last 168 hours. Coagulation Profile: Recent Labs  Lab 05/24/21 1830 05/26/21 1408  INR 1.0 1.2    BNP (last 3 results) No results for input(s): PROBNP in the last 8760 hours. HbA1C: No results for input(s): HGBA1C in the last 72 hours. CBG: No  results for input(s): GLUCAP in the last 168 hours. Lipid Profile: No results for input(s): CHOL, HDL, LDLCALC, TRIG, CHOLHDL, LDLDIRECT in the last 72 hours. Thyroid Function Tests: No results for input(s): TSH, T4TOTAL, FREET4, T3FREE, THYROIDAB in the last 72 hours. Sepsis Labs: No results for input(s): PROCALCITON, LATICACIDVEN in the last 168 hours.  No results found for this or any previous visit (from the past 240 hour(s)).  Antimicrobials: Anti-infectives (From admission, onward)    None      Culture/Microbiology    Component Value Date/Time   SDES  05/11/2021 1658    URINE, CLEAN CATCH Performed at [facility], [address] [place], [place] [address]    [facility]  05/11/2021 1658    NONE Performed at [facility], [address] [place], [place] [address]    CULT (A) 05/11/2021 1658    <10,000 COLONIES/mL INSIGNIFICANT GROWTH Performed at [facility] [address] [place], [place] [address]    REPTSTATUS 05/13/2021 FINAL 05/11/2021 1658    Other culture-see note  Radiology Studies: DG Chest Port 1 View  Result Date: 05/25/2021 CLINICAL DATA:  Dyspnea. EXAM: PORTABLE CHEST 1 VIEW COMPARISON:  August 10, 2016. FINDINGS: Stable cardiomegaly. Both lungs are clear. The visualized skeletal structures are unremarkable. IMPRESSION: No active disease. Electronically Signed   By: Marijo Conception M.D.   On: 05/25/2021 11:35   ECHOCARDIOGRAM COMPLETE  Result Date: 05/26/2021    ECHOCARDIOGRAM REPORT   Patient Name:   BRANDILEE PIES Date of Exam: 05/26/2021 Medical Rec #:  [id number]     Height:       63.0 in Accession #:    [id number]    Weight:       180.0 lb Date of Birth:  1945-07-12     BSA:          1.849 m Patient Age:    59 years      BP:           131/84 mmHg  Patient Gender: F             HR:           88 bpm. Exam Location:  Inpatient Procedure: 2D Echo, Cardiac Doppler and Color Doppler Indications:    Abnormal ECG R94.31  History:         Patient has no prior history of Echocardiogram examinations.                 Chronic kidney stage stage IIIb, anemia chronic kidney disease,                 history of pernicious anemia, history of hyperparathyroidism.  Sonographer:    Darlina Sicilian RDCS Referring Phys: [id number] [place] IMPRESSIONS  1. Left ventricular ejection fraction, by estimation, is >75%. The left ventricle has hyperdynamic function. The left ventricle has no regional wall motion abnormalities. Left ventricular diastolic function could not be evaluated.  2. Right ventricular systolic function is normal. The right ventricular size is normal.  3. The mitral valve was not well visualized. No evidence of mitral valve regurgitation. No evidence of mitral stenosis.  4. The aortic valve is tricuspid. There is mild calcification of the aortic valve. Aortic valve regurgitation is not visualized. Aortic valve sclerosis/calcification is present, without any evidence of aortic stenosis.  5. Aortic dilatation noted. There is mild dilatation of the ascending aorta, measuring 41 mm.  6. The inferior vena cava is normal in size with greater than 50% respiratory variability, suggesting right atrial pressure of 3 mmHg. Comparison(s): No prior Echocardiogram. Conclusion(s)/Recommendation(s): Hyperdynamic LV with LV cavity peak gradient 47 mmHg. Consider high flow state vs hypovolemia. FINDINGS  Left Ventricle: LV cavity peak gradient 47 mmHg. Left ventricular ejection fraction, by estimation, is >75%. The left ventricle has hyperdynamic function. The left ventricle has no regional wall motion abnormalities. The left ventricular internal cavity  size was normal in size. Suboptimal image quality limits for assessment of left ventricular hypertrophy. Left ventricular diastolic function could not be evaluated due to nondiagnostic images. Left ventricular diastolic function could not be evaluated. Right Ventricle: The right ventricular size is normal. Right  vetricular wall thickness was not well visualized. Right ventricular systolic function is normal. Left Atrium: Left atrial size was not well visualized. Right Atrium: Right atrial size was not well visualized. Pericardium: There is no evidence of pericardial effusion. Presence of epicardial fat layer. Mitral Valve: The mitral valve was not well visualized. No evidence of mitral valve regurgitation. No evidence of mitral valve stenosis. Tricuspid Valve: The tricuspid valve is not well visualized. Tricuspid valve regurgitation is trivial. No evidence of tricuspid stenosis. Aortic Valve: The aortic valve is tricuspid. There is mild calcification of the aortic valve. Aortic valve regurgitation is not visualized. Aortic valve sclerosis/calcification is present, without any evidence of aortic stenosis. Aortic valve mean gradient measures 5.0 mmHg. Aortic valve peak gradient measures 9.9 mmHg. Aortic valve area, by VTI measures 2.81 cm. Pulmonic Valve: The pulmonic valve was not well visualized. Pulmonic valve regurgitation is trivial. No evidence of pulmonic stenosis. Aorta: Aortic dilatation noted. There is mild dilatation of the ascending aorta, measuring 41 mm. Venous: The inferior vena cava is normal in size with greater than 50% respiratory variability, suggesting right atrial pressure of 3 mmHg. IAS/Shunts: The interatrial septum was not well visualized.  LEFT VENTRICLE PLAX 2D LVIDd:         4.00 cm LVIDs:         3.10 cm LV PW:  1.00 cm LV IVS:        1.20 cm LVOT diam:     1.90 cm LV SV:         62 LV SV Index:   33 LVOT Area:     2.84 cm  RIGHT VENTRICLE RV S prime:     14.90 cm/s LEFT ATRIUM             Index LA diam:        2.70 cm 1.46 cm/m LA Vol (A2C):   33.7 ml 18.23 ml/m LA Vol (A4C):   41.6 ml 22.50 ml/m LA Biplane Vol: 37.9 ml 20.50 ml/m  AORTIC VALVE AV Area (Vmax):    2.78 cm AV Area (Vmean):   2.86 cm AV Area (VTI):     2.81 cm AV Vmax:           157.00 cm/s AV Vmean:          102.000  cm/s AV VTI:            0.219 m AV Peak Grad:      9.9 mmHg AV Mean Grad:      5.0 mmHg LVOT Vmax:         154.00 cm/s LVOT Vmean:        103.000 cm/s LVOT VTI:          0.217 m LVOT/AV VTI ratio: 0.99  AORTA Ao Root diam: 3.00 cm Ao Asc diam:  4.10 cm  SHUNTS Systemic VTI:  0.22 m Systemic Diam: 1.90 cm Buford Dresser MD Electronically signed by Buford Dresser MD Signature Date/Time: 05/26/2021/8:01:33 PM    Final      LOS: 3 days   Antonieta Pert, MD [facility]  05/27/2021, 11:26 AM

## 2021-05-27 NOTE — Progress Notes (Addendum)
Attending physician's note   I have taken a history, reviewed the chart, and examined the patient. I performed a substantive portion of this encounter, including complete performance of at least one of the key components, in conjunction with the APP. I agree with the APP's note, impression, and recommendations with my edits.   Liver enzymes are downtrending. Patient asking to advance her diet. A1AT normal and the remainder of the extended serologic evaluation still pending.  Ferritin was elevated at 3986 with an otherwise normal iron panel. Elevated ferritin could certainly be an acute phase reactant, but interestingly, the iron % earlier in the month was  elevated at 65% (no ferritin at that time).   - Checking HFE - Can repeat LAEs in the AM to ensure continued downtrend, and if down, can d/c trend  - No role for ERCP based on MRCP and lab/clinical improvement - Ok to advance diet from GI standpoint - Will f/u on extended lab w/u    Gerrit Heck, DO, FACG [phone] office          Progress Note   Subjective  Chief Complaint: Elevated LFTs, abdominal pain with nausea and vomiting  This morning, patient tells me she is doing well other than being uncomfortable in the bed.  She tells me she has not had anything to eat today.  Denies any new complaints or concerns.   Objective   Vital signs in last 24 hours: Temp:  [98.2 F (36.8 C)] 98.2 F (36.8 C) (05/18 0351) Pulse Rate:  [75-101] 97 (05/18 0351) Resp:  [16-19] 16 (05/18 0351) BP: (100-119)/(60-75) 100/74 (05/18 0351) SpO2:  [95 %-100 %] 95 % (05/18 0351) Weight:  [84.1 kg] 84.1 kg (05/18 0355)   General:   AA female in NAD Heart:  Regular rate and rhythm; no murmurs Lungs: Respirations even and unlabored, lungs CTA bilaterally Abdomen:  Soft, nontender and nondistended. Normal bowel sounds. Psych:  Cooperative. Normal mood and affect.  Intake/Output from previous day: 05/17 0701 - 05/18 0700 In:  1252.4 [I.V.:1252.4] Out: 1550 [Urine:1550] Intake/Output this shift: Total I/O In: 150 [I.V.:150] Out: -   Lab Results: Recent Labs    05/25/21 0453 05/26/21 0546 05/27/21 0132  WBC 7.0 9.5 9.2  HGB 7.5* 8.2* 7.5*  HCT 23.8* 26.6* 23.7*  PLT 360 397 335   BMET Recent Labs    05/25/21 0453 05/26/21 0546 05/27/21 0132  NA 134* 134* 135  K 4.8 4.6 4.5  CL 107 107 110  CO2 20* 19* 19*  GLUCOSE 94 127* 114*  BUN 27* 28* 30*  CREATININE 1.42* 1.45* 1.35*  CALCIUM 9.9 10.6* 10.6*      Latest Ref Rng & Units 05/27/2021    1:32 AM 05/26/2021    5:46 AM 05/25/2021    4:53 AM  Hepatic Function  Total Protein 6.5 - 8.1 g/dL 7.8   8.6   7.7    Albumin 3.5 - 5.0 g/dL 1.6   1.7   1.5    AST 15 - 41 U/L 976   1,147   974    ALT 0 - 44 U/L 270   299   272    Alk Phosphatase 38 - 126 U/L 334   426   447    Total Bilirubin 0.3 - 1.2 mg/dL 3.0   3.5   3.2       PT/INR Recent Labs    05/24/21 1830 05/26/21 1408  LABPROT 13.5 14.6  INR 1.0  1.2    Studies/Results: DG Chest Port 1 View  Result Date: 05/25/2021 CLINICAL DATA:  Dyspnea. EXAM: PORTABLE CHEST 1 VIEW COMPARISON:  August 10, 2016. FINDINGS: Stable cardiomegaly. Both lungs are clear. The visualized skeletal structures are unremarkable. IMPRESSION: No active disease. Electronically Signed   By: Marijo Conception M.D.   On: 05/25/2021 11:35   ECHOCARDIOGRAM COMPLETE  Result Date: 05/26/2021    ECHOCARDIOGRAM REPORT   Patient Name:   Danielle Zamora Date of Exam: 05/26/2021 Medical Rec #:  [id number]     Height:       63.0 in Accession #:    [id number]    Weight:       180.0 lb Date of Birth:  09/28/1945     BSA:          1.849 m Patient Age:    76 years      BP:           131/84 mmHg Patient Gender: F             HR:           88 bpm. Exam Location:  Inpatient Procedure: 2D Echo, Cardiac Doppler and Color Doppler Indications:    Abnormal ECG R94.31  History:        Patient has no prior history of Echocardiogram examinations.                  Chronic kidney stage stage IIIb, anemia chronic kidney disease,                 history of pernicious anemia, history of hyperparathyroidism.  Sonographer:    Darlina Sicilian RDCS Referring Phys: [id number] [place] IMPRESSIONS  1. Left ventricular ejection fraction, by estimation, is >75%. The left ventricle has hyperdynamic function. The left ventricle has no regional wall motion abnormalities. Left ventricular diastolic function could not be evaluated.  2. Right ventricular systolic function is normal. The right ventricular size is normal.  3. The mitral valve was not well visualized. No evidence of mitral valve regurgitation. No evidence of mitral stenosis.  4. The aortic valve is tricuspid. There is mild calcification of the aortic valve. Aortic valve regurgitation is not visualized. Aortic valve sclerosis/calcification is present, without any evidence of aortic stenosis.  5. Aortic dilatation noted. There is mild dilatation of the ascending aorta, measuring 41 mm.  6. The inferior vena cava is normal in size with greater than 50% respiratory variability, suggesting right atrial pressure of 3 mmHg. Comparison(s): No prior Echocardiogram. Conclusion(s)/Recommendation(s): Hyperdynamic LV with LV cavity peak gradient 47 mmHg. Consider high flow state vs hypovolemia. FINDINGS  Left Ventricle: LV cavity peak gradient 47 mmHg. Left ventricular ejection fraction, by estimation, is >75%. The left ventricle has hyperdynamic function. The left ventricle has no regional wall motion abnormalities. The left ventricular internal cavity  size was normal in size. Suboptimal image quality limits for assessment of left ventricular hypertrophy. Left ventricular diastolic function could not be evaluated due to nondiagnostic images. Left ventricular diastolic function could not be evaluated. Right Ventricle: The right ventricular size is normal. Right vetricular wall thickness was not well visualized. Right ventricular  systolic function is normal. Left Atrium: Left atrial size was not well visualized. Right Atrium: Right atrial size was not well visualized. Pericardium: There is no evidence of pericardial effusion. Presence of epicardial fat layer. Mitral Valve: The mitral valve was not well visualized. No evidence of mitral valve regurgitation. No evidence of mitral valve  stenosis. Tricuspid Valve: The tricuspid valve is not well visualized. Tricuspid valve regurgitation is trivial. No evidence of tricuspid stenosis. Aortic Valve: The aortic valve is tricuspid. There is mild calcification of the aortic valve. Aortic valve regurgitation is not visualized. Aortic valve sclerosis/calcification is present, without any evidence of aortic stenosis. Aortic valve mean gradient measures 5.0 mmHg. Aortic valve peak gradient measures 9.9 mmHg. Aortic valve area, by VTI measures 2.81 cm. Pulmonic Valve: The pulmonic valve was not well visualized. Pulmonic valve regurgitation is trivial. No evidence of pulmonic stenosis. Aorta: Aortic dilatation noted. There is mild dilatation of the ascending aorta, measuring 41 mm. Venous: The inferior vena cava is normal in size with greater than 50% respiratory variability, suggesting right atrial pressure of 3 mmHg. IAS/Shunts: The interatrial septum was not well visualized.  LEFT VENTRICLE PLAX 2D LVIDd:         4.00 cm LVIDs:         3.10 cm LV PW:         1.00 cm LV IVS:        1.20 cm LVOT diam:     1.90 cm LV SV:         62 LV SV Index:   33 LVOT Area:     2.84 cm  RIGHT VENTRICLE RV S prime:     14.90 cm/s LEFT ATRIUM             Index LA diam:        2.70 cm 1.46 cm/m LA Vol (A2C):   33.7 ml 18.23 ml/m LA Vol (A4C):   41.6 ml 22.50 ml/m LA Biplane Vol: 37.9 ml 20.50 ml/m  AORTIC VALVE AV Area (Vmax):    2.78 cm AV Area (Vmean):   2.86 cm AV Area (VTI):     2.81 cm AV Vmax:           157.00 cm/s AV Vmean:          102.000 cm/s AV VTI:            0.219 m AV Peak Grad:      9.9 mmHg AV Mean  Grad:      5.0 mmHg LVOT Vmax:         154.00 cm/s LVOT Vmean:        103.000 cm/s LVOT VTI:          0.217 m LVOT/AV VTI ratio: 0.99  AORTA Ao Root diam: 3.00 cm Ao Asc diam:  4.10 cm  SHUNTS Systemic VTI:  0.22 m Systemic Diam: 1.90 cm Buford Dresser MD Electronically signed by Buford Dresser MD Signature Date/Time: 05/26/2021/8:01:33 PM    Final        Assessment / Plan:   Assessment: 1.  Elevated LFTs: Previously normal 02/24/2021, elevated at time of arrival (and prior per nursing facility), have started to trend down, MRCP with no ductal dilation, cholelithiasis on imaging, no new medications or supplement use, no history of liver disease, did present with some abdominal pain, nausea and vomiting, ferritin elevated at 3986, percent saturation elevated at 37, PT/INR normal, alpha-1 antitrypsin elevated at 204, ANA negative 2.  Cholelithiasis  Plan: 1.  Awaiting rest of labs from liver work-up: ASMA, AMA 2.  Continue to trend LFTs daily 3.  Continue supportive measures 4.  Likely Ferritin is acute phase reactant but will order HFE testing in case  Thank you for your kind consultation.   LOS: 3 days   Levin Erp  05/27/2021,  10:47 AM

## 2021-05-28 ENCOUNTER — Inpatient Hospital Stay (HOSPITAL_COMMUNITY): Payer: Medicare Other

## 2021-05-28 ENCOUNTER — Inpatient Hospital Stay: Payer: Medicare Other | Admitting: Primary Care

## 2021-05-28 DIAGNOSIS — R7989 Other specified abnormal findings of blood chemistry: Secondary | ICD-10-CM | POA: Diagnosis not present

## 2021-05-28 LAB — IRON AND TIBC
Iron: 88 ug/dL (ref 28–170)
Saturation Ratios: 40 % — ABNORMAL HIGH (ref 10.4–31.8)
TIBC: 221 ug/dL — ABNORMAL LOW (ref 250–450)
UIBC: 133 ug/dL

## 2021-05-28 LAB — CBC
HCT: 21.4 % — ABNORMAL LOW (ref 36.0–46.0)
Hemoglobin: 6.4 g/dL — CL (ref 12.0–15.0)
MCH: 30 pg (ref 26.0–34.0)
MCHC: 29.9 g/dL — ABNORMAL LOW (ref 30.0–36.0)
MCV: 100.5 fL — ABNORMAL HIGH (ref 80.0–100.0)
Platelets: 292 10*3/uL (ref 150–400)
RBC: 2.13 MIL/uL — ABNORMAL LOW (ref 3.87–5.11)
RDW: 17.6 % — ABNORMAL HIGH (ref 11.5–15.5)
WBC: 8.2 10*3/uL (ref 4.0–10.5)
nRBC: 2.9 % — ABNORMAL HIGH (ref 0.0–0.2)

## 2021-05-28 LAB — COMPREHENSIVE METABOLIC PANEL
ALT: 249 U/L — ABNORMAL HIGH (ref 0–44)
AST: 830 U/L — ABNORMAL HIGH (ref 15–41)
Albumin: <1.5 g/dL — ABNORMAL LOW (ref 3.5–5.0)
Alkaline Phosphatase: 310 U/L — ABNORMAL HIGH (ref 38–126)
Anion gap: 6 (ref 5–15)
BUN: 39 mg/dL — ABNORMAL HIGH (ref 8–23)
CO2: 20 mmol/L — ABNORMAL LOW (ref 22–32)
Calcium: 10.5 mg/dL — ABNORMAL HIGH (ref 8.9–10.3)
Chloride: 109 mmol/L (ref 98–111)
Creatinine, Ser: 1.49 mg/dL — ABNORMAL HIGH (ref 0.44–1.00)
GFR, Estimated: 36 mL/min — ABNORMAL LOW (ref >60)
Glucose, Bld: 92 mg/dL (ref 70–99)
Potassium: 4.4 mmol/L (ref 3.5–5.1)
Sodium: 135 mmol/L (ref 135–145)
Total Bilirubin: 2.7 mg/dL — ABNORMAL HIGH (ref 0.3–1.2)
Total Protein: 7 g/dL (ref 6.5–8.1)

## 2021-05-28 LAB — VITAMIN B12: Vitamin B-12: 647 pg/mL (ref 180–914)

## 2021-05-28 LAB — RETICULOCYTES
Immature Retic Fract: 43.4 % — ABNORMAL HIGH (ref 2.3–15.9)
RBC.: 2.15 MIL/uL — ABNORMAL LOW (ref 3.87–5.11)
Retic Count, Absolute: 147.9 10*3/uL (ref 19.0–186.0)
Retic Ct Pct: 6.9 % — ABNORMAL HIGH (ref 0.4–3.1)

## 2021-05-28 LAB — MAGNESIUM: Magnesium: 2.1 mg/dL (ref 1.7–2.4)

## 2021-05-28 LAB — HEMOGLOBIN AND HEMATOCRIT, BLOOD
HCT: 24.9 % — ABNORMAL LOW (ref 36.0–46.0)
Hemoglobin: 7.9 g/dL — ABNORMAL LOW (ref 12.0–15.0)

## 2021-05-28 LAB — FERRITIN: Ferritin: 3122 ng/mL — ABNORMAL HIGH (ref 11–307)

## 2021-05-28 LAB — FOLATE: Folate: 18 ng/mL (ref >5.9)

## 2021-05-28 LAB — VITAMIN D 25 HYDROXY (VIT D DEFICIENCY, FRACTURES): Vit D, 25-Hydroxy: 50 ng/mL (ref 30–100)

## 2021-05-28 LAB — PREPARE RBC (CROSSMATCH)

## 2021-05-28 MED ORDER — SODIUM CHLORIDE 0.9% IV SOLUTION: Freq: Once | INTRAVENOUS | Status: AC

## 2021-05-28 NOTE — Progress Notes (Signed)
1 unit PRBCs ordered to be transfused for hemoglobin down to 6.4K from 7.5K.

## 2021-05-28 NOTE — Progress Notes (Signed)
Progress Note   Subjective  Chief Complaint: Elevated LFTs, abdominal pain with nausea and vomiting and decreasing hemoglobin  This morning, the patient tells me that she is tired and "I do not quite know what is going on because there has been a lot happening".  Denies any new complaints or concerns.  Does not believe she is passing any blood in her stool.  Per nursing staff Hemoccult has been ordered which they are collecting.   Objective   Vital signs in last 24 hours: Temp:  [98.3 F (36.8 C)-98.6 F (37 C)] 98.3 F (36.8 C) (05/19 1010) Pulse Rate:  [85-100] 91 (05/19 1010) Resp:  [14-17] 16 (05/19 1010) BP: (103-117)/(55-76) 112/65 (05/19 1010) SpO2:  [95 %-100 %] 99 % (05/19 1010) Weight:  [84.1 kg] 84.1 kg (05/18 2115)   General:   AA female in NAD Heart:  Regular rate and rhythm; no murmurs Lungs: Respirations even and unlabored, lungs CTA bilaterally Abdomen:  Soft, nontender and nondistended. Normal bowel sounds. Psych:  Cooperative. Normal mood and affect.  Intake/Output from previous day: 05/18 0701 - 05/19 0700 In: 2859.8 [P.O.:1130; I.V.:1729.8] Out: 4503 [Urine:1675]   Lab Results: Recent Labs    05/26/21 0546 05/27/21 0132 05/28/21 0338  WBC 9.5 9.2 8.2  HGB 8.2* 7.5* 6.4*  HCT 26.6* 23.7* 21.4*  PLT 397 335 292   BMET Recent Labs    05/26/21 0546 05/27/21 0132 05/28/21 0338  NA 134* 135 135  K 4.6 4.5 4.4  CL 107 110 109  CO2 19* 19* 20*  GLUCOSE 127* 114* 92  BUN 28* 30* 39*  CREATININE 1.45* 1.35* 1.49*  CALCIUM 10.6* 10.6* 10.5*      Latest Ref Rng & Units 05/28/2021    3:38 AM 05/27/2021    1:32 AM 05/26/2021    5:46 AM  Hepatic Function  Total Protein 6.5 - 8.1 g/dL 7.0   7.8   8.6    Albumin 3.5 - 5.0 g/dL <1.5   1.6   1.7    AST 15 - 41 U/L 830   976   1,147    ALT 0 - 44 U/L 249   270   299    Alk Phosphatase 38 - 126 U/L 310   334   426    Total Bilirubin 0.3 - 1.2 mg/dL 2.7   3.0   3.5       PT/INR Recent Labs     05/26/21 1408  LABPROT 14.6  INR 1.2    Studies/Results: DG Cervical Spine 2 or 3 views  Result Date: 05/27/2021 CLINICAL DATA:  Neck pain, no injury or fall EXAM: CERVICAL SPINE - 2-3 VIEW COMPARISON:  None Available. FINDINGS: The cervical spine is visualized from the skull base through C6. There is no evidence of cervical spine fracture or prevertebral soft tissue swelling. Alignment is normal. No other significant bone abnormalities are identified. IMPRESSION: No acute fracture in the imaged cervical spine. Electronically Signed   By: Merilyn Baba M.D.   On: 05/27/2021 16:49   ECHOCARDIOGRAM COMPLETE  Result Date: 05/26/2021    ECHOCARDIOGRAM REPORT   Patient Name:   Danielle Zamora Date of Exam: 05/26/2021 Medical Rec #:  [id number]     Height:       63.0 in Accession #:    [id number]    Weight:       180.0 lb Date of Birth:  1945-09-29     BSA:  1.849 m Patient Age:    76 years      BP:           131/84 mmHg Patient Gender: F             HR:           88 bpm. Exam Location:  Inpatient Procedure: 2D Echo, Cardiac Doppler and Color Doppler Indications:    Abnormal ECG R94.31  History:        Patient has no prior history of Echocardiogram examinations.                 Chronic kidney stage stage IIIb, anemia chronic kidney disease,                 history of pernicious anemia, history of hyperparathyroidism.  Sonographer:    Darlina Sicilian RDCS Referring Phys: [id number] [place] IMPRESSIONS  1. Left ventricular ejection fraction, by estimation, is >75%. The left ventricle has hyperdynamic function. The left ventricle has no regional wall motion abnormalities. Left ventricular diastolic function could not be evaluated.  2. Right ventricular systolic function is normal. The right ventricular size is normal.  3. The mitral valve was not well visualized. No evidence of mitral valve regurgitation. No evidence of mitral stenosis.  4. The aortic valve is tricuspid. There is mild calcification of the  aortic valve. Aortic valve regurgitation is not visualized. Aortic valve sclerosis/calcification is present, without any evidence of aortic stenosis.  5. Aortic dilatation noted. There is mild dilatation of the ascending aorta, measuring 41 mm.  6. The inferior vena cava is normal in size with greater than 50% respiratory variability, suggesting right atrial pressure of 3 mmHg. Comparison(s): No prior Echocardiogram. Conclusion(s)/Recommendation(s): Hyperdynamic LV with LV cavity peak gradient 47 mmHg. Consider high flow state vs hypovolemia. FINDINGS  Left Ventricle: LV cavity peak gradient 47 mmHg. Left ventricular ejection fraction, by estimation, is >75%. The left ventricle has hyperdynamic function. The left ventricle has no regional wall motion abnormalities. The left ventricular internal cavity  size was normal in size. Suboptimal image quality limits for assessment of left ventricular hypertrophy. Left ventricular diastolic function could not be evaluated due to nondiagnostic images. Left ventricular diastolic function could not be evaluated. Right Ventricle: The right ventricular size is normal. Right vetricular wall thickness was not well visualized. Right ventricular systolic function is normal. Left Atrium: Left atrial size was not well visualized. Right Atrium: Right atrial size was not well visualized. Pericardium: There is no evidence of pericardial effusion. Presence of epicardial fat layer. Mitral Valve: The mitral valve was not well visualized. No evidence of mitral valve regurgitation. No evidence of mitral valve stenosis. Tricuspid Valve: The tricuspid valve is not well visualized. Tricuspid valve regurgitation is trivial. No evidence of tricuspid stenosis. Aortic Valve: The aortic valve is tricuspid. There is mild calcification of the aortic valve. Aortic valve regurgitation is not visualized. Aortic valve sclerosis/calcification is present, without any evidence of aortic stenosis. Aortic valve  mean gradient measures 5.0 mmHg. Aortic valve peak gradient measures 9.9 mmHg. Aortic valve area, by VTI measures 2.81 cm. Pulmonic Valve: The pulmonic valve was not well visualized. Pulmonic valve regurgitation is trivial. No evidence of pulmonic stenosis. Aorta: Aortic dilatation noted. There is mild dilatation of the ascending aorta, measuring 41 mm. Venous: The inferior vena cava is normal in size with greater than 50% respiratory variability, suggesting right atrial pressure of 3 mmHg. IAS/Shunts: The interatrial septum was not well visualized.  LEFT  VENTRICLE PLAX 2D LVIDd:         4.00 cm LVIDs:         3.10 cm LV PW:         1.00 cm LV IVS:        1.20 cm LVOT diam:     1.90 cm LV SV:         62 LV SV Index:   33 LVOT Area:     2.84 cm  RIGHT VENTRICLE RV S prime:     14.90 cm/s LEFT ATRIUM             Index LA diam:        2.70 cm 1.46 cm/m LA Vol (A2C):   33.7 ml 18.23 ml/m LA Vol (A4C):   41.6 ml 22.50 ml/m LA Biplane Vol: 37.9 ml 20.50 ml/m  AORTIC VALVE AV Area (Vmax):    2.78 cm AV Area (Vmean):   2.86 cm AV Area (VTI):     2.81 cm AV Vmax:           157.00 cm/s AV Vmean:          102.000 cm/s AV VTI:            0.219 m AV Peak Grad:      9.9 mmHg AV Mean Grad:      5.0 mmHg LVOT Vmax:         154.00 cm/s LVOT Vmean:        103.000 cm/s LVOT VTI:          0.217 m LVOT/AV VTI ratio: 0.99  AORTA Ao Root diam: 3.00 cm Ao Asc diam:  4.10 cm  SHUNTS Systemic VTI:  0.22 m Systemic Diam: 1.90 cm Buford Dresser MD Electronically signed by Buford Dresser MD Signature Date/Time: 05/26/2021/8:01:33 PM    Final       Assessment / Plan:   Assessment: 1.  Elevated LFTs: Previously normal 02/24/2021, have started to trend down, MRCP with no ductal dilation, cholelithiasis on imaging, did initially have some abdominal pain, nausea and vomiting, ferritin elevated at 3986 (likely acute phase reactant); likely viral/past stone, also checking for hemochromatosis though low suspicion 2.   Cholelithiasis 3.  Anemia: Thought from chronic disease in the past, though hemoglobin now trending down 8.2--> 7.5--> 6.4--> 1 unit PRBCs ordered, 05/26/2021 iron panel with a percent saturation elevated at 37% and otherwise normal, ferritin elevated at 3996  Plan: 1.  Awaiting rest of labs from liver work-up, HFE pending 2.  LFTs continue to trend down, we can stop checking these every day and check every 2 to 3 days while patient continues to be hospitalized 3.  Given anemia and decreased hemoglobin overnight, if patient is Hemoccult positive may need to consider further work-up.  Will await results from this.  Thanks for your kind consultation, we will continue to follow along until Hemoccult results.  If it is negative we will sign off.   LOS: 4 days   Levin Erp  05/28/2021, 11:13 AM

## 2021-05-28 NOTE — Progress Notes (Signed)
PROGRESS NOTE Danielle Zamora  MWU:[id number] DOB: 11-12-1945 DOA: 05/24/2021 PCP: Pleas Koch, NP   Brief Narrative/Hospital Course: HTN, CKD stage IIIb, anemia of chronic disease, pernicious anemia, hypothyroidism recently discharged to skilled nursing home after spending several days at [facility] after sustaining mechanical fall complicated by acute kidney injury on 5/5.  At the facility she was noted to have elevated LFTs which was confirmed on the laboratory care.  Right upper quadrant ultrasound showed gallstones but no evidence of acute cholecystitis.  MRCP showed gallstones but no evidence of ductal dilation, mild pancreatitis.  GI and general surgery consulted.  MRCP with possible mild acute pancreatitis but no evidence of ductal dilatation, may have recently passed a gallstone.  Patient managed with IV fluids, pain medication.  Acute hepatitis respiratory panel negative.    Subjective: Seen and examined.  Alert awake resting comfortably Overnight no fever, hemoglobin down trended to 6.4 g will need PRBC ordered Creatinine stable at 1.4, AST ALT TB slowly downtrending Son endorsed patient has been complaining of neck pain on the right side for several weeks now Patient reports she had some neck pain especially with movement mostly on the right  Assessment and Plan: Principal Problem:   Elevated LFTs Active Problems:   Cholelithiasis   Essential hypertension   Stage 3b chronic kidney disease ([place])   Anemia due to chronic kidney disease   Elevated bilirubin  Elevated LFTs Cholelithiasis Presumed acute biliary pancreatitis: RUQ ultrasound gallstone no evidence of acute cholecystitis but MRCP showed mild acute pancreatitis no evidence of ductal dilatation, may have recently passed a gall stone.  Acute hepatitis panel negative.  General and GI following.  LFTs continue to downtrend.  Per GI no role of ERCP okay to advance diet. surgery monitoring closely for readiness/plan for lap  cholecystectomy.  GI has initiated work-up ASMA, AMA.  Ferritin level elevated at 3986, PT/INR normal, alpha 1 antitrypsin elevated at 204, ANA negative.    Anemia due to chronic kidney disease Acute hemoglobin drop overnight no obvious blood loss noted.  Transfusing 1 unit PRBC.  Check anemia panel/Hemoccult. 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  Stage 3b chronic kidney disease: Renal function is stable.  Monitor Recent Labs  Lab 05/24/21 1830 05/25/21 0453 05/26/21 0546 05/27/21 0132 05/28/21 0338  BUN 24* 27* 28* 30* 39*  CREATININE 1.45* 1.42* 1.45* 1.35* 1.49*    Essential hypertension: Stable.  Not on home meds currently.  History of chronic hypercalcemia with hyperparathyroidism Parathyroid anemia: Seen at [facility] endocrinology recommended outpatient parathyroid surgery.  Electrolytes are stable calcium is slightly on higher side 10.6 Recent Labs  Lab 05/24/21 1830 05/25/21 0453 05/26/21 0546 05/27/21 0132 05/28/21 0338  K 5.0 4.8 4.6 4.5 4.4  CALCIUM 10.3 9.9 10.6* 10.6* 10.5*  MG  --  2.5* 2.3 2.2 2.1    Coronary artery calcification on CT 04/23/2021: Patient has no chest pain, mobility limited at baseline.  Obtained echocardiogram-EF more than 72%, LV diastolic function could not be evaluated, no regional wall motion abnormalities..EKG with sinus tachycardia frequent PVC, RBBB present on previous EKG in 5/1.    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  DVT prophylaxis: SCDs Start: 05/24/21 2023 Code Status:   Code Status: Full Code Family Communication: plan of care discussed with patient at bedside.  I had updated patient's son over the phone yesterday. Patient status is: Inpatient because of ongoing management of cholelithiasis/elevated LFTs Level of care: Med-Surg   Dispo: The patient is from: home            Anticipated disposition: home once cleared by GI/surgery  Mobility Assessment (last 72 hours)     Mobility Assessment     Row Name 05/28/21 0900 05/27/21 0914 05/25/21 1359       Does patient have an order for bedrest or is patient medically unstable No - Continue assessment No - Continue assessment No - Continue assessment     What is the highest level of mobility based on the progressive mobility assessment? -- Level 3 (Stands with assist) - Balance while standing  and cannot march in place Level 3 (Stands with assist) - Balance while standing  and cannot march in place     Is the above level different from baseline mobility prior to current illness? -- Yes - Recommend PT order Yes - Recommend PT order               Objective: Vitals last 24 hrs: Vitals:   05/27/21 2115 05/28/21 0510 05/28/21 0925 05/28/21 1010  BP: 117/76 (!) 103/59 109/72 112/65  Pulse: 100 85 91 91  Resp: '17 16 17 16  '$ Temp: 98.5 F (36.9 C) 98.6 F (37 C) 98.5 F (36.9 C) 98.3 F (36.8 C)  TempSrc: Oral     SpO2: 95% 100%  99%  Weight: 84.1 kg     Height:       Weight change: 0 kg  Physical Examination: General exam: AA oriented, ill-appearing,older than stated age, weak appearing. HEENT:Oral mucosa moist, Ear/Nose WNL grossly, dentition normal. Respiratory system: bilaterally clear ,no use of accessory muscle Cardiovascular system: S1 & S2 +, No JVD,. Gastrointestinal system: Abdomen soft,NT,ND, BS+ Nervous System:Alert, awake, moving extremities and grossly nonfocal Extremities: edema neg,distal  peripheral pulses palpable.  Skin: No rashes,no icterus. MSK: Normal muscle bulk,tone, power Mild tenderness on the right neck /post  Medications reviewed:  Scheduled Meds:  diclofenac Sodium  2 g Topical TID   nicotine  14 mg Transdermal Daily   Continuous Infusions:  lactated ringers 75 mL/hr at 05/28/21 3295      Diet Order             Diet clear liquid Room service appropriate? Yes; Fluid consistency: Thin  Diet effective now                  Intake/Output Summary (Last 24 hours) at 05/28/2021 1103 Last data filed at 05/28/2021 0647 Gross per 24 hour  Intake 2319.75 ml  Output 1675 ml  Net 644.75 ml   Net IO Since Admission: 1,333.97 mL [05/28/21 1103]  Wt Readings from Last 3 Encounters:  05/27/21 84.1 kg  05/11/21 88.5 kg  04/23/21 88 kg     Unresulted Labs (From admission, onward)     Start     Ordered   05/28/21 0832  Occult blood card to lab, stool  Once,   R        05/28/21 0831   05/28/21 0831  Vitamin B12  (  Anemia Panel (PNL))  Add-on,   AD        05/28/21 0831   05/28/21 0831  Folate  (Anemia Panel (PNL))  Add-on,   AD        05/28/21 0831   05/28/21 0831  Iron and TIBC  (Anemia Panel (PNL))  Add-on,   AD        05/28/21 0831   05/28/21 0831  Ferritin  (Anemia Panel (PNL))  Add-on,   AD        05/28/21 0831   05/27/21 1121  Hemochromatosis DNA-PCR(c282y,h63d)  Once,   R        05/27/21 1121   05/26/21 0500  Comprehensive metabolic panel  Daily,   R      05/25/21 0749   05/26/21 0500  CBC  Daily,   R      05/25/21 0749   05/26/21 0500  Magnesium  Daily,   R      05/25/21 0749          Data Reviewed: I have personally reviewed following labs and imaging studies CBC: Recent Labs  Lab 05/24/21 1830 05/25/21 0453 05/26/21 0546 05/27/21 0132 05/28/21 0338  WBC 7.6 7.0 9.5 9.2 8.2  NEUTROABS 3.9 3.9  --   --   --   HGB 8.7* 7.5* 8.2* 7.5* 6.4*  HCT 28.5* 23.8* 26.6* 23.7* 21.4*  MCV 98.3 97.1 96.0 98.3 100.5*  PLT 383 360 397 335 938    Basic Metabolic Panel: Recent Labs  Lab 05/24/21 1830 05/25/21 0453 05/26/21 0546 05/27/21 0132 05/28/21 0338  NA 133* 134* 134* 135 135  K 5.0 4.8 4.6 4.5 4.4  CL 105 107 107 110 109  CO2 21* 20* 19* 19* 20*  GLUCOSE 113* 94 127* 114* 92  BUN 24* 27* 28* 30* 39*  CREATININE 1.45* 1.42* 1.45* 1.35* 1.49*  CALCIUM 10.3 9.9 10.6* 10.6* 10.5*  MG  --  2.5* 2.3 2.2 2.1   GFR: Estimated Creatinine Clearance: 33 mL/min (A) (by C-G formula based on SCr of 1.49 mg/dL (H)). Liver Function Tests: Recent Labs  Lab 05/24/21 1830 05/25/21 0453 05/26/21 0546 05/27/21 0132 05/28/21 0338  AST 1,131* 974* 1,147* 976* 830*  ALT 309* 272* 299* 270* 249*  ALKPHOS 537* 447* 426* 334* 310*  BILITOT 3.9* 3.2* 3.5* 3.0* 2.7*  PROT 8.6* 7.7 8.6* 7.8 7.0  ALBUMIN 1.8* 1.5* 1.7* 1.6* <1.5*   Recent Labs  Lab 05/24/21 1830 05/25/21 0453 05/27/21 0132  LIPASE 138* 120* 73*   No results for input(s): AMMONIA in the last 168 hours. Coagulation Profile: Recent Labs  Lab 05/24/21 1830 05/26/21 1408  INR 1.0 1.2   BNP (last 3 results) No results for input(s): PROBNP in the last 8760 hours. HbA1C: No results for input(s): HGBA1C in the last 72 hours. CBG: No results for input(s): GLUCAP in the last 168 hours. Lipid Profile: No results for input(s): CHOL, HDL, LDLCALC, TRIG, CHOLHDL, LDLDIRECT in the last 72 hours. Thyroid Function Tests: No results for input(s): TSH, T4TOTAL, FREET4, T3FREE, THYROIDAB in the last 72 hours. Sepsis Labs: No results for input(s): PROCALCITON, LATICACIDVEN in the last 168 hours.  No results found for this or any previous visit (from the past 240 hour(s)).  Antimicrobials: Anti-infectives (From admission, onward)    None      Culture/Microbiology    Component Value Date/Time   SDES  05/11/2021 1658    URINE, CLEAN CATCH Performed at [facility], [address]  [place], [place] [address]    [facility]  05/11/2021 1658     NONE Performed at [facility], [address] [place], [place] [address]    CULT (A) 05/11/2021 1658    <10,000 COLONIES/mL INSIGNIFICANT GROWTH Performed at [facility] [address], [place], [place] [address]    REPTSTATUS 05/13/2021 FINAL 05/11/2021 1658    Other culture-see note  Radiology Studies: DG Cervical Spine 2 or 3 views  Result Date: 05/27/2021 CLINICAL DATA:  Neck pain, no injury or fall EXAM: CERVICAL SPINE - 2-3 VIEW COMPARISON:  None Available. FINDINGS: The cervical spine is visualized from the skull base through C6. There is no evidence of cervical spine fracture or prevertebral soft tissue swelling. Alignment is normal. No other significant bone abnormalities are identified. IMPRESSION: No acute fracture in the imaged cervical spine. Electronically Signed   By: Merilyn Baba M.D.   On: 05/27/2021 16:49   ECHOCARDIOGRAM COMPLETE  Result Date: 05/26/2021    ECHOCARDIOGRAM REPORT   Patient Name:   HALCYON HECK Date of Exam: 05/26/2021 Medical Rec #:  [id number]     Height:       63.0 in Accession #:    [id number]    Weight:       180.0 lb Date of Birth:  Feb 27, 1945     BSA:          1.849 m Patient Age:    89 years      BP:           131/84 mmHg Patient Gender: F             HR:           88 bpm. Exam Location:  Inpatient Procedure: 2D Echo, Cardiac Doppler and Color Doppler Indications:    Abnormal ECG R94.31  History:        Patient has no prior history of Echocardiogram examinations.                 Chronic kidney stage stage IIIb, anemia chronic kidney disease,                 history of pernicious anemia, history of hyperparathyroidism.  Sonographer:    Darlina Sicilian RDCS Referring Phys: [id number] [place] IMPRESSIONS  1. Left ventricular ejection fraction, by estimation, is >75%. The left ventricle has hyperdynamic function. The left ventricle has no regional wall motion abnormalities. Left ventricular diastolic function could not be evaluated.  2. Right  ventricular systolic function is normal. The right ventricular size is normal.  3. The mitral valve was not well visualized. No evidence of mitral valve regurgitation. No evidence of mitral stenosis.  4. The aortic valve is tricuspid. There is mild calcification of the aortic valve. Aortic valve regurgitation is not visualized. Aortic valve sclerosis/calcification is present, without any evidence of aortic stenosis.  5. Aortic dilatation noted. There is mild dilatation of the ascending aorta, measuring 41 mm.  6. The inferior vena cava is normal in size with greater than 50% respiratory variability, suggesting right atrial pressure of 3 mmHg. Comparison(s): No prior Echocardiogram. Conclusion(s)/Recommendation(s): Hyperdynamic LV with LV cavity peak gradient 47 mmHg. Consider high flow state vs hypovolemia. FINDINGS  Left Ventricle: LV cavity peak gradient 47 mmHg. Left ventricular ejection fraction, by estimation, is >75%. The left ventricle has hyperdynamic function. The left ventricle has no regional wall motion abnormalities. The left ventricular internal cavity  size was normal in size. Suboptimal image quality limits for  assessment of left ventricular hypertrophy. Left ventricular diastolic function could not be evaluated due to nondiagnostic images. Left ventricular diastolic function could not be evaluated. Right Ventricle: The right ventricular size is normal. Right vetricular wall thickness was not well visualized. Right ventricular systolic function is normal. Left Atrium: Left atrial size was not well visualized. Right Atrium: Right atrial size was not well visualized. Pericardium: There is no evidence of pericardial effusion. Presence of epicardial fat layer. Mitral Valve: The mitral valve was not well visualized. No evidence of mitral valve regurgitation. No evidence of mitral valve stenosis. Tricuspid Valve: The tricuspid valve is not well visualized. Tricuspid valve regurgitation is trivial. No  evidence of tricuspid stenosis. Aortic Valve: The aortic valve is tricuspid. There is mild calcification of the aortic valve. Aortic valve regurgitation is not visualized. Aortic valve sclerosis/calcification is present, without any evidence of aortic stenosis. Aortic valve mean gradient measures 5.0 mmHg. Aortic valve peak gradient measures 9.9 mmHg. Aortic valve area, by VTI measures 2.81 cm. Pulmonic Valve: The pulmonic valve was not well visualized. Pulmonic valve regurgitation is trivial. No evidence of pulmonic stenosis. Aorta: Aortic dilatation noted. There is mild dilatation of the ascending aorta, measuring 41 mm. Venous: The inferior vena cava is normal in size with greater than 50% respiratory variability, suggesting right atrial pressure of 3 mmHg. IAS/Shunts: The interatrial septum was not well visualized.  LEFT VENTRICLE PLAX 2D LVIDd:         4.00 cm LVIDs:         3.10 cm LV PW:         1.00 cm LV IVS:        1.20 cm LVOT diam:     1.90 cm LV SV:         62 LV SV Index:   33 LVOT Area:     2.84 cm  RIGHT VENTRICLE RV S prime:     14.90 cm/s LEFT ATRIUM             Index LA diam:        2.70 cm 1.46 cm/m LA Vol (A2C):   33.7 ml 18.23 ml/m LA Vol (A4C):   41.6 ml 22.50 ml/m LA Biplane Vol: 37.9 ml 20.50 ml/m  AORTIC VALVE AV Area (Vmax):    2.78 cm AV Area (Vmean):   2.86 cm AV Area (VTI):     2.81 cm AV Vmax:           157.00 cm/s AV Vmean:          102.000 cm/s AV VTI:            0.219 m AV Peak Grad:      9.9 mmHg AV Mean Grad:      5.0 mmHg LVOT Vmax:         154.00 cm/s LVOT Vmean:        103.000 cm/s LVOT VTI:          0.217 m LVOT/AV VTI ratio: 0.99  AORTA Ao Root diam: 3.00 cm Ao Asc diam:  4.10 cm  SHUNTS Systemic VTI:  0.22 m Systemic Diam: 1.90 cm Buford Dresser MD Electronically signed by Buford Dresser MD Signature Date/Time: 05/26/2021/8:01:33 PM    Final      LOS: 4 days   Antonieta Pert, MD [facility]  05/28/2021, 11:03 AM

## 2021-05-28 NOTE — Evaluation (Signed)
Occupational Therapy Evaluation Patient Details Name: Danielle Zamora MRN: [id number] DOB: 1945-11-09 Today's Date: 05/28/2021   History of Present Illness 75 y.o. F admitted on 5/15 due to elevated LFT's. PMH significant of hypertension, chronic kidney stage stage IIIb, anemia chronic kidney disease, history of pernicious anemia, history of hyperparathyroidism   Clinical Impression   Pt admitted for concerns listed above. PTA pt reported that she has been at a SNF recently and they have not been working on standing her, only using a hoyer to transfer her. Before her last hospitalization, pt was independent with all ADL's and functional mobility using a rollator. At this time, pt presents with increased weakness, balance deficits, and decreased activity tolerance. Pt quick to fatigue sitting EOB and requiring min-max support to maintain balance. Recommending pt  return to SNF once medically stable. OT will follow acutely.      Recommendations for follow up therapy are one component of a multi-disciplinary discharge planning process, led by the attending physician.  Recommendations may be updated based on patient status, additional functional criteria and insurance authorization.   Follow Up Recommendations  Skilled nursing-short term rehab (<3 hours/day)    Assistance Recommended at Discharge Frequent or constant Supervision/Assistance  Patient can return home with the following Two people to help with walking and/or transfers;Two people to help with bathing/dressing/bathroom;Assistance with cooking/housework;Assistance with feeding;Direct supervision/assist for medications management;Direct supervision/assist for financial management;Assist for transportation;Help with stairs or ramp for entrance    Functional Status Assessment  Patient has had a recent decline in their functional status and demonstrates the ability to make significant improvements in function in a reasonable and predictable  amount of time.  Equipment Recommendations  None recommended by OT    Recommendations for Other Services       Precautions / Restrictions Precautions Precautions: Fall Restrictions Weight Bearing Restrictions: No      Mobility Bed Mobility Overal bed mobility: Needs Assistance Bed Mobility: Supine to Sit, Sit to Supine     Supine to sit: Max assist, +2 for physical assistance, +2 for safety/equipment, HOB elevated Sit to supine: Max assist, +2 for physical assistance, +2 for safety/equipment   General bed mobility comments: Max A for all aspects of bed mobility    Transfers Overall transfer level: Needs assistance Equipment used: 2 person hand held assist Transfers: Sit to/from Stand Sit to Stand: Total assist, +2 physical assistance, +2 safety/equipment           General transfer comment: Unable to clear her bottom from bed.      Balance Overall balance assessment: Needs assistance Sitting-balance support: Feet supported, Bilateral upper extremity supported Sitting balance-Leahy Scale: Poor Sitting balance - Comments: Pt unable to hold herself up without min A, frequently losing balance posteriorly and anteriorly. Postural control: Posterior lean, Other (comment) (anteriorly)                                 ADL either performed or assessed with clinical judgement   ADL Overall ADL's : Needs assistance/impaired Eating/Feeding: Minimal assistance;Sitting   Grooming: Moderate assistance;Sitting   Upper Body Bathing: Moderate assistance;Sitting   Lower Body Bathing: Maximal assistance;+2 for physical assistance;+2 for safety/equipment;Sitting/lateral leans;Sit to/from stand   Upper Body Dressing : Moderate assistance;Sitting   Lower Body Dressing: Maximal assistance;+2 for physical assistance;+2 for safety/equipment;Sitting/lateral leans;Sit to/from stand   Toilet Transfer: Total assistance;+2 for physical assistance;+2 for safety/equipment    Toileting- Clothing Manipulation and  Hygiene: Maximal assistance;+2 for safety/equipment;+2 for physical assistance;Sitting/lateral lean         General ADL Comments: Pt requiring increased assist due to increased weakness and balance deficits     Vision Baseline Vision/History: 1 Wears glasses Ability to See in Adequate Light: 0 Adequate Patient Visual Report: No change from baseline Vision Assessment?: No apparent visual deficits     Perception     Praxis      Pertinent Vitals/Pain Pain Assessment Pain Assessment: No/denies pain     Hand Dominance Right   Extremity/Trunk Assessment Upper Extremity Assessment Upper Extremity Assessment: Generalized weakness (3/5 strength poor activity tolerance)   Lower Extremity Assessment Lower Extremity Assessment: Defer to PT evaluation   Cervical / Trunk Assessment Cervical / Trunk Assessment: Kyphotic   Communication Communication Communication: No difficulties   Cognition Arousal/Alertness: Awake/alert Behavior During Therapy: WFL for tasks assessed/performed Overall Cognitive Status: Within Functional Limits for tasks assessed                                       General Comments  VSS    Exercises     Shoulder Instructions      Home Living Family/patient expects to be discharged to:: [place]: Rollator (4 wheels);Cane - single point;Shower seat          Prior Functioning/Environment Prior Level of Function : Needs assist             Mobility Comments: Pt reports that she has been getting lifted out of bed at nursing home ADLs Comments: Assist with all ADL's        OT Problem List: Decreased strength;Decreased range of motion;Decreased activity tolerance;Impaired balance (sitting and/or standing);Decreased coordination;Decreased cognition;Decreased safety awareness;Decreased knowledge of use of DME or  AE;Cardiopulmonary status limiting activity;Impaired sensation;Obesity;Impaired UE functional use;Pain      OT Treatment/Interventions: Self-care/ADL training;Therapeutic exercise;Energy conservation;DME and/or AE instruction;Therapeutic activities;Patient/family education;Balance training;Cognitive remediation/compensation    OT Goals(Current goals can be found in the care plan section) Acute Rehab OT Goals Patient Stated Goal: To go home agin OT Goal Formulation: With patient Time For Goal Achievement: 06/11/21 Potential to Achieve Goals: Good ADL Goals Pt Will Perform Lower Body Bathing: with min assist;sitting/lateral leans;sit to/from stand Pt Will Perform Lower Body Dressing: with min assist;sitting/lateral leans;sit to/from stand Pt Will Transfer to Toilet: with mod assist;stand pivot transfer Pt Will Perform Toileting - Clothing Manipulation and hygiene: with min assist;sitting/lateral leans Additional ADL Goal #1: Pt will maintain sitting balance for 3-5 mins with no assist to complete seated ADL tasks.  OT Frequency: Min 2X/week    Co-evaluation              AM-PAC OT "6 Clicks" Daily Activity     Outcome Measure Help from another person eating meals?: A Lot Help from another person taking care of personal grooming?: A Lot Help from another person toileting, which includes using toliet, bedpan, or urinal?: A Lot Help from another person bathing (including washing, rinsing, drying)?: A Lot Help from another person to put on and taking off regular upper body clothing?: A Lot Help from another person to put on and taking off regular lower body clothing?: A Lot 6 Click Score: 12  End of Session Equipment Utilized During Treatment: Gait belt Nurse Communication: Mobility status  Activity Tolerance: Patient limited by fatigue Patient left: in bed;with call bell/phone within reach;with nursing/sitter in room;with family/visitor present  OT Visit Diagnosis: Unsteadiness on  feet (R26.81);Other abnormalities of gait and mobility (R26.89);Muscle weakness (generalized) (M62.81)                Time: 0355-9741 OT Time Calculation (min): 25 min Charges:  OT General Charges $OT Visit: 1 Visit OT Evaluation $OT Eval Moderate Complexity: 1 Mod OT Treatments $Self Care/Home Management : 8-22 mins  Jeremy Ditullio H., OTR/L Acute Rehabilitation  Miniya Miguez Elane Shyhiem Beeney 05/28/2021, 4:30 PM

## 2021-05-28 NOTE — Progress Notes (Signed)
Called blood bank to verify if type and screen was obtained. Per staff,blood sample wasn't received yet.

## 2021-05-28 NOTE — Evaluation (Signed)
Physical Therapy Evaluation Patient Details Name: Danielle Zamora MRN: [id number] DOB: 1945-08-18 Today's Date: 05/28/2021  History of Present Illness  76 y.o. F admitted on 5/15 due to elevated LFT's. PMH significant of hypertension, chronic kidney stage stage IIIb, anemia chronic kidney disease, history of pernicious anemia, history of hyperparathyroidism  Clinical Impression  Patient admitted with the above. Patient recently discharged to [facility] for rehab following hospitalization at [facility]. Patient reports staff using hoyer lift to get OOB to w/c at SNF. Patient currently presenting with weakness, impaired sitting balance, decreased activity tolerance, and impaired functional mobility. Patient requires maxA+2 for all aspects of bed mobility and totalA+2 for attempt to stand but patient not initiating movement. While sitting EOB, patient requires minA due to intermittent posterior and anterior LOBs. Patient will benefit from skilled PT services during acute stay to address listed deficits. Recommend return to SNF to maximize functional mobility and decrease burden of care.        Recommendations for follow up therapy are one component of a multi-disciplinary discharge planning process, led by the attending physician.  Recommendations may be updated based on patient status, additional functional criteria and insurance authorization.  Follow Up Recommendations Skilled nursing-short term rehab (<3 hours/day)    Assistance Recommended at Discharge Frequent or constant Supervision/Assistance  Patient can return home with the following  Two people to help with walking and/or transfers;Two people to help with bathing/dressing/bathroom;Assistance with cooking/housework;Direct supervision/assist for medications management;Direct supervision/assist for financial management;Assist for transportation;Help with stairs or ramp for entrance    Equipment Recommendations None recommended by PT   Recommendations for Other Services       Functional Status Assessment Patient has had a recent decline in their functional status and/or demonstrates limited ability to make significant improvements in function in a reasonable and predictable amount of time     Precautions / Restrictions Precautions Precautions: Fall Restrictions Weight Bearing Restrictions: No      Mobility  Bed Mobility Overal bed mobility: Needs Assistance Bed Mobility: Supine to Sit, Sit to Supine     Supine to sit: Max assist, +2 for physical assistance, +2 for safety/equipment, HOB elevated Sit to supine: Max assist, +2 for physical assistance, +2 for safety/equipment   General bed mobility comments: Max A+2 for all aspects of bed mobility    Transfers Overall transfer level: Needs assistance Equipment used: 2 person hand held assist Transfers: Sit to/from Stand Sit to Stand: Total assist, +2 physical assistance, +2 safety/equipment           General transfer comment: unable to clear buttocks from bed despite totalA+2. Patient not initiating movement    Ambulation/Gait                  Stairs            Wheelchair Mobility    Modified Rankin (Stroke Patients Only)       Balance Overall balance assessment: Needs assistance Sitting-balance support: Feet supported, Bilateral upper extremity supported Sitting balance-Leahy Scale: Poor Sitting balance - Comments: Pt unable to hold herself up without min A, frequently losing balance posteriorly and anteriorly. Postural control: Posterior lean, Other (comment) (anteriorly after attempted to stand)                                   Pertinent Vitals/Pain Pain Assessment Pain Assessment: No/denies pain    Home Living Family/patient expects to be discharged  to:: [place]: Rollator (4 wheels);Cane - single point;Shower seat      Prior Function Prior Level  of Function : Needs assist             Mobility Comments: Pt reports that she has been getting lifted out of bed at nursing home ADLs Comments: Assist with all ADL's     Hand Dominance   Dominant Hand: Right    Extremity/Trunk Assessment   Upper Extremity Assessment Upper Extremity Assessment: Defer to OT evaluation    Lower Extremity Assessment Lower Extremity Assessment: Generalized weakness (grossly 2-/5)    Cervical / Trunk Assessment Cervical / Trunk Assessment: Kyphotic  Communication   Communication: No difficulties  Cognition Arousal/Alertness: Awake/alert Behavior During Therapy: WFL for tasks assessed/performed Overall Cognitive Status: Within Functional Limits for tasks assessed                                 General Comments: fearful of falling        General Comments General comments (skin integrity, edema, etc.): VSS    Exercises     Assessment/Plan    PT Assessment Patient needs continued PT services  PT Problem List Decreased strength;Decreased mobility;Decreased safety awareness;Decreased activity tolerance;Decreased balance       PT Treatment Interventions DME instruction;Therapeutic exercise;Gait training;Balance training;Stair training;Neuromuscular re-education;Functional mobility training;Therapeutic activities;Patient/family education    PT Goals (Current goals can be found in the Care Plan section)  Acute Rehab PT Goals Patient Stated Goal: to get stronger PT Goal Formulation: With patient/family Time For Goal Achievement: 06/11/21 Potential to Achieve Goals: Fair    Frequency Min 2X/week     Co-evaluation PT/OT/SLP Co-Evaluation/Treatment: Yes Reason for Co-Treatment: For patient/therapist safety;To address functional/ADL transfers PT goals addressed during session: Balance;Mobility/safety with mobility         AM-PAC PT "6 Clicks" Mobility  Outcome Measure Help needed turning from your back to your side  while in a flat bed without using bedrails?: Total Help needed moving from lying on your back to sitting on the side of a flat bed without using bedrails?: Total Help needed moving to and from a bed to a chair (including a wheelchair)?: Total Help needed standing up from a chair using your arms (e.g., wheelchair or bedside chair)?: Total Help needed to walk in hospital room?: Total Help needed climbing 3-5 steps with a railing? : Total 6 Click Score: 6    End of Session Equipment Utilized During Treatment: Gait belt Activity Tolerance: Patient tolerated treatment well Patient left: in bed;with call bell/phone within reach;with bed alarm set;with nursing/sitter in room;with family/visitor present Nurse Communication: Mobility status PT Visit Diagnosis: Unsteadiness on feet (R26.81);Other abnormalities of gait and mobility (R26.89);Muscle weakness (generalized) (M62.81);History of falling (Z91.81);Difficulty in walking, not elsewhere classified (R26.2)    Time: 1400-1431 PT Time Calculation (min) (ACUTE ONLY): 31 min   Charges:   PT Evaluation $PT Eval Moderate Complexity: 1 Mod          Sharee Sturdy A. Gilford Rile PT, DPT Acute Rehabilitation Services Pager [phone] Office [phone]   Linna Hoff 05/28/2021, 4:57 PM

## 2021-05-28 NOTE — Progress Notes (Signed)
   05/28/21 0434  Notify: Provider  Provider Name/Title C. Hall,DO  Date Provider Notified 05/28/21  Time Provider Notified [phone]  Method of Notification Page (secure chat)  Notification Reason Critical result (hemoglobin 6.4)  Provider response See new orders  Date of Provider Response 05/28/21  Time of Provider Response [phone]

## 2021-05-29 DIAGNOSIS — R7989 Other specified abnormal findings of blood chemistry: Secondary | ICD-10-CM | POA: Diagnosis not present

## 2021-05-29 LAB — COMPREHENSIVE METABOLIC PANEL
ALT: 256 U/L — ABNORMAL HIGH (ref 0–44)
AST: 739 U/L — ABNORMAL HIGH (ref 15–41)
Albumin: 1.6 g/dL — ABNORMAL LOW (ref 3.5–5.0)
Alkaline Phosphatase: 333 U/L — ABNORMAL HIGH (ref 38–126)
Anion gap: 7 (ref 5–15)
BUN: 34 mg/dL — ABNORMAL HIGH (ref 8–23)
CO2: 20 mmol/L — ABNORMAL LOW (ref 22–32)
Calcium: 10.7 mg/dL — ABNORMAL HIGH (ref 8.9–10.3)
Chloride: 110 mmol/L (ref 98–111)
Creatinine, Ser: 1.26 mg/dL — ABNORMAL HIGH (ref 0.44–1.00)
GFR, Estimated: 44 mL/min — ABNORMAL LOW (ref >60)
Glucose, Bld: 97 mg/dL (ref 70–99)
Potassium: 4.4 mmol/L (ref 3.5–5.1)
Sodium: 137 mmol/L (ref 135–145)
Total Bilirubin: 2.9 mg/dL — ABNORMAL HIGH (ref 0.3–1.2)
Total Protein: 7.3 g/dL (ref 6.5–8.1)

## 2021-05-29 LAB — BPAM RBC
Blood Product Expiration Date: 202306142359
ISSUE DATE / TIME: 202305190948
Unit Type and Rh: 5100

## 2021-05-29 LAB — TYPE AND SCREEN
ABO/RH(D): O POS
Antibody Screen: NEGATIVE
Unit division: 0

## 2021-05-29 LAB — CBC
HCT: 27.4 % — ABNORMAL LOW (ref 36.0–46.0)
Hemoglobin: 8.7 g/dL — ABNORMAL LOW (ref 12.0–15.0)
MCH: 30.2 pg (ref 26.0–34.0)
MCHC: 31.8 g/dL (ref 30.0–36.0)
MCV: 95.1 fL (ref 80.0–100.0)
Platelets: 275 10*3/uL (ref 150–400)
RBC: 2.88 MIL/uL — ABNORMAL LOW (ref 3.87–5.11)
RDW: 20.2 % — ABNORMAL HIGH (ref 11.5–15.5)
WBC: 8.1 10*3/uL (ref 4.0–10.5)
nRBC: 1.7 % — ABNORMAL HIGH (ref 0.0–0.2)

## 2021-05-29 LAB — URIC ACID: Uric Acid, Serum: 8.9 mg/dL — ABNORMAL HIGH (ref 2.5–7.1)

## 2021-05-29 LAB — IGG: IgG (Immunoglobin G), Serum: 3043 mg/dL — ABNORMAL HIGH (ref 586–1602)

## 2021-05-29 MED ORDER — SODIUM CHLORIDE 0.9 % IV SOLN: INTRAVENOUS | Status: DC

## 2021-05-29 MED ORDER — PEG-KCL-NACL-NASULF-NA ASC-C 100 G PO SOLR: 1.0000 | Freq: Once | ORAL | Status: DC

## 2021-05-29 MED ORDER — PEG-KCL-NACL-NASULF-NA ASC-C 100 G PO SOLR
0.5000 | Freq: Once | ORAL | Status: AC
Start: 2021-05-29 — End: 2021-05-29
  Administered 2021-05-29: 100 g via ORAL
  Filled 2021-05-29: qty 1

## 2021-05-29 NOTE — Progress Notes (Signed)
Progress Note   Subjective  Chief Complaint: Elevated LFTs, abdominal pain with nausea and vomiting and anemia  Patient tells me that she is feeling fairly okay today.  Aware that she is anemic though.  When we further discussed what she thought was a colonoscopy previously it sounds like it was an MRI.  She has never had a colonoscopy or endoscopy.  Tells me she has not passed a stool since yesterday.  Denies any new complaints or concerns.    Objective   Vital signs in last 24 hours: Temp:  [98 F (36.7 C)-99.2 F (37.3 C)] 98 F (36.7 C) (05/20 0915) Pulse Rate:  [81-94] 90 (05/20 0915) Resp:  [16-18] 16 (05/20 0915) BP: (91-126)/(57-82) 116/69 (05/20 0915) SpO2:  [97 %-100 %] 100 % (05/20 0915) Weight:  [87.1 kg] 87.1 kg (05/20 0500) Last BM Date :  (utd) General:  AA female in NAD Heart:  Regular rate and rhythm; no murmurs Lungs: Respirations even and unlabored, lungs CTA bilaterally Abdomen:  Soft, nontender and nondistended. Normal bowel sounds. Psych:  Cooperative. Normal mood and affect.  Intake/Output from previous day: 05/19 0701 - 05/20 0700 In: 3056.8 [P.O.:1360; I.V.:1350.8; Blood:346] Out: 2878 [Urine:1650] Intake/Output this shift: Total I/O In: 360 [P.O.:360] Out: -   Lab Results: Recent Labs    05/27/21 0132 05/28/21 0338 05/28/21 1524 05/29/21 0404  WBC 9.2 8.2  --  8.1  HGB 7.5* 6.4* 7.9* 8.7*  HCT 23.7* 21.4* 24.9* 27.4*  PLT 335 292  --  275   BMET Recent Labs    05/27/21 0132 05/28/21 0338 05/29/21 0404  NA 135 135 137  K 4.5 4.4 4.4  CL 110 109 110  CO2 19* 20* 20*  GLUCOSE 114* 92 97  BUN 30* 39* 34*  CREATININE 1.35* 1.49* 1.26*  CALCIUM 10.6* 10.5* 10.7*      Latest Ref Rng & Units 05/29/2021    4:04 AM 05/28/2021    3:38 AM 05/27/2021    1:32 AM  Hepatic Function  Total Protein 6.5 - 8.1 g/dL 7.3   7.0   7.8    Albumin 3.5 - 5.0 g/dL 1.6   <1.5   1.6    AST 15 - 41 U/L 739   830   976    ALT 0 - 44 U/L 256   249    270    Alk Phosphatase 38 - 126 U/L 333   310   334    Total Bilirubin 0.3 - 1.2 mg/dL 2.9   2.7   3.0       PT/INR Recent Labs    05/26/21 1408  LABPROT 14.6  INR 1.2    Studies/Results: DG Cervical Spine 2 or 3 views  Result Date: 05/27/2021 CLINICAL DATA:  Neck pain, no injury or fall EXAM: CERVICAL SPINE - 2-3 VIEW COMPARISON:  None Available. FINDINGS: The cervical spine is visualized from the skull base through C6. There is no evidence of cervical spine fracture or prevertebral soft tissue swelling. Alignment is normal. No other significant bone abnormalities are identified. IMPRESSION: No acute fracture in the imaged cervical spine. Electronically Signed   By: Merilyn Baba M.D.   On: 05/27/2021 16:49   CT CERVICAL SPINE WO CONTRAST  Result Date: 05/28/2021 CLINICAL DATA:  Chronic neck pain. EXAM: CT CERVICAL SPINE WITHOUT CONTRAST TECHNIQUE: Multidetector CT imaging of the cervical spine was performed without intravenous contrast. Multiplanar CT image reconstructions were also generated. RADIATION DOSE REDUCTION: This exam  was performed according to the departmental dose-optimization program which includes automated exposure control, adjustment of the mA and/or kV according to patient size and/or use of iterative reconstruction technique. COMPARISON:  None Available. FINDINGS: Alignment: Normal. There is dextroconvex curvature of the cervical spine which may be positional. Skull base and vertebrae: No acute fracture. No primary bone lesion or focal pathologic process. Bones are osteopenic. Soft tissues and spinal canal: No prevertebral fluid or swelling. No visible canal hematoma. Disc levels: Disc spaces are preserved. There are mild degenerative endplate osteophytes at C4-C5 and C5-C6. There is no significant central canal or neural foraminal stenosis at any level. Upper chest: Mild emphysematous changes are present. Other: None. IMPRESSION: No acute fracture or malalignment. Minimal  degenerative changes. Electronically Signed   By: Ronney Asters M.D.   On: 05/28/2021 23:15     Assessment / Plan:   Assessment: 1.  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  Cholelithiasis 3.  Anemia: From chronic disease in the past, low hemoglobin now trending down 8.2--> 7.5--> 6.4--> 1 unit PRBCs--> 7.9--> 8.7, Hemoccult ordered but has not been done, no previous endoscopic work-up; consider GI bleed versus known kidney disease versus other  Plan: 1.  An ultrasound-guided liver biopsy has been ordered.  I have been advised by the radiology team that this will not occur until Monday at the earliest. 2.  Due to anemia and no prior history of endoscopic work-up we will proceed with an EGD and colonoscopy.  These are scheduled for tomorrow.  Discussed this thoroughly with the patient.  Did discuss risks, benefits, limitations and alternatives and the patient agrees to proceed.  I also called and discussed this with her son as she requested. 3.  Continue to trend LFTs 4.  We will continue to await the rest of liver work-up including HFE 5.  Continue to monitor hemoglobin and transfuse as needed less than 7  Thank you for your kind consultation, we will continue to follow.   LOS: 5 days   Levin Erp  05/29/2021, 11:11 AM

## 2021-05-29 NOTE — H&P (View-Only) (Signed)
Progress Note   Subjective  Chief Complaint: Elevated LFTs, abdominal pain with nausea and vomiting and anemia  Patient tells me that she is feeling fairly okay today.  Aware that she is anemic though.  When we further discussed what she thought was a colonoscopy previously it sounds like it was an MRI.  She has never had a colonoscopy or endoscopy.  Tells me she has not passed a stool since yesterday.  Denies any new complaints or concerns.    Objective   Vital signs in last 24 hours: Temp:  [98 F (36.7 C)-99.2 F (37.3 C)] 98 F (36.7 C) (05/20 0915) Pulse Rate:  [81-94] 90 (05/20 0915) Resp:  [16-18] 16 (05/20 0915) BP: (91-126)/(57-82) 116/69 (05/20 0915) SpO2:  [97 %-100 %] 100 % (05/20 0915) Weight:  [87.1 kg] 87.1 kg (05/20 0500) Last BM Date :  (utd) General:  AA female in NAD Heart:  Regular rate and rhythm; no murmurs Lungs: Respirations even and unlabored, lungs CTA bilaterally Abdomen:  Soft, nontender and nondistended. Normal bowel sounds. Psych:  Cooperative. Normal mood and affect.  Intake/Output from previous day: 05/19 0701 - 05/20 0700 In: 3056.8 [P.O.:1360; I.V.:1350.8; Blood:346] Out: 3559 [Urine:1650] Intake/Output this shift: Total I/O In: 360 [P.O.:360] Out: -   Lab Results: Recent Labs    05/27/21 0132 05/28/21 0338 05/28/21 1524 05/29/21 0404  WBC 9.2 8.2  --  8.1  HGB 7.5* 6.4* 7.9* 8.7*  HCT 23.7* 21.4* 24.9* 27.4*  PLT 335 292  --  275   BMET Recent Labs    05/27/21 0132 05/28/21 0338 05/29/21 0404  NA 135 135 137  K 4.5 4.4 4.4  CL 110 109 110  CO2 19* 20* 20*  GLUCOSE 114* 92 97  BUN 30* 39* 34*  CREATININE 1.35* 1.49* 1.26*  CALCIUM 10.6* 10.5* 10.7*      Latest Ref Rng & Units 05/29/2021    4:04 AM 05/28/2021    3:38 AM 05/27/2021    1:32 AM  Hepatic Function  Total Protein 6.5 - 8.1 g/dL 7.3   7.0   7.8    Albumin 3.5 - 5.0 g/dL 1.6   <1.5   1.6    AST 15 - 41 U/L 739   830   976    ALT 0 - 44 U/L 256   249    270    Alk Phosphatase 38 - 126 U/L 333   310   334    Total Bilirubin 0.3 - 1.2 mg/dL 2.9   2.7   3.0       PT/INR Recent Labs    05/26/21 1408  LABPROT 14.6  INR 1.2    Studies/Results: DG Cervical Spine 2 or 3 views  Result Date: 05/27/2021 CLINICAL DATA:  Neck pain, no injury or fall EXAM: CERVICAL SPINE - 2-3 VIEW COMPARISON:  None Available. FINDINGS: The cervical spine is visualized from the skull base through C6. There is no evidence of cervical spine fracture or prevertebral soft tissue swelling. Alignment is normal. No other significant bone abnormalities are identified. IMPRESSION: No acute fracture in the imaged cervical spine. Electronically Signed   By: Merilyn Baba M.D.   On: 05/27/2021 16:49   CT CERVICAL SPINE WO CONTRAST  Result Date: 05/28/2021 CLINICAL DATA:  Chronic neck pain. EXAM: CT CERVICAL SPINE WITHOUT CONTRAST TECHNIQUE: Multidetector CT imaging of the cervical spine was performed without intravenous contrast. Multiplanar CT image reconstructions were also generated. RADIATION DOSE REDUCTION: This exam  was performed according to the departmental dose-optimization program which includes automated exposure control, adjustment of the mA and/or kV according to patient size and/or use of iterative reconstruction technique. COMPARISON:  None Available. FINDINGS: Alignment: Normal. There is dextroconvex curvature of the cervical spine which may be positional. Skull base and vertebrae: No acute fracture. No primary bone lesion or focal pathologic process. Bones are osteopenic. Soft tissues and spinal canal: No prevertebral fluid or swelling. No visible canal hematoma. Disc levels: Disc spaces are preserved. There are mild degenerative endplate osteophytes at C4-C5 and C5-C6. There is no significant central canal or neural foraminal stenosis at any level. Upper chest: Mild emphysematous changes are present. Other: None. IMPRESSION: No acute fracture or malalignment. Minimal  degenerative changes. Electronically Signed   By: Ronney Asters M.D.   On: 05/28/2021 23:15     Assessment / Plan:   Assessment: 1.  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  Cholelithiasis 3.  Anemia: From chronic disease in the past, low hemoglobin now trending down 8.2--> 7.5--> 6.4--> 1 unit PRBCs--> 7.9--> 8.7, Hemoccult ordered but has not been done, no previous endoscopic work-up; consider GI bleed versus known kidney disease versus other  Plan: 1.  An ultrasound-guided liver biopsy has been ordered.  I have been advised by the radiology team that this will not occur until Monday at the earliest. 2.  Due to anemia and no prior history of endoscopic work-up we will proceed with an EGD and colonoscopy.  These are scheduled for tomorrow.  Discussed this thoroughly with the patient.  Did discuss risks, benefits, limitations and alternatives and the patient agrees to proceed.  I also called and discussed this with her son as she requested. 3.  Continue to trend LFTs 4.  We will continue to await the rest of liver work-up including HFE 5.  Continue to monitor hemoglobin and transfuse as needed less than 7  Thank you for your kind consultation, we will continue to follow.   LOS: 5 days   Levin Erp  05/29/2021, 11:11 AM

## 2021-05-29 NOTE — Progress Notes (Signed)
PROGRESS NOTE Danielle Zamora  EHM:[id number] DOB: [birth year] DOA: 05/24/2021 PCP: Pleas Koch, NP   Brief Narrative/Hospital Course: HTN, CKD stage IIIb, anemia of chronic disease, pernicious anemia, hypothyroidism recently discharged to skilled nursing home after spending several days at [facility] after sustaining mechanical fall complicated by acute kidney injury on 5/5.  At the facility she was noted to have elevated LFTs which was confirmed on the laboratory care.  Right upper quadrant ultrasound showed gallstones but no evidence of acute cholecystitis.  MRCP showed gallstones but no evidence of ductal dilation, mild pancreatitis.  GI and general surgery consulted.  MRCP with possible mild acute pancreatitis but no evidence of ductal dilatation, may have recently passed a gallstone.  Patient managed with IV fluids, pain medication.  Acute hepatitis respiratory panel negative.  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  Patient had anemia needing 1 unit blood transfusion anemia panel shows anemia of chronic disease ferritin elevated.  Patient has had neck pain cervical spine shows arthritis being managed symptomatically, followed by PT OT recommending skilled nursing facility    Subjective: Seen and examined this morning.  She is resting comfortably. Overnight afebrile blood pressure was soft around 9 PM yesterday in [place] shows downtrending LFTs-mostly AST total bili elevated 2.9 creatinine stable 1.2 hemoglobin improved to 8.7 g, vitamin D B12 normal FOBT pending Patient complains of gout/osteoporosis pain in her leg rt- chronic. Assessment and Plan: Principal Problem:   Elevated LFTs Active Problems:   Cholelithiasis   Essential hypertension   Stage 3b chronic kidney disease (HCC)   Anemia due to chronic kidney disease   Elevated  bilirubin  Elevated LFTs Cholelithiasis Presumed acute biliary pancreatitis: RUQ ultrasound gallstone no evidence of acute cholecystitis but MRCP showed mild acute pancreatitis no evidence of ductal dilatation, may have recently passed a gall stone.  Acute hepatitis panel negative.  General surgery and GI following.  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  Patient tolerating full liquid diet advance as tolerated.  Surgery discussed lap cholecystectomy if patient has a symptoms but at this time no real evidence and signed off.   Anemia due to chronic kidney disease Acute drop in hemoglobin: Had drop in hb-without obvious blood loss, hemoglobin improved with 1 unit PRBC.  B12 normal, ferritin elevated suspect anemia of chronic disease. Hemoccult pending.  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  Stage 3b chronic kidney disease: Renal function is stable.  Monitor Recent Labs  Lab 05/25/21 0453 05/26/21 0546 05/27/21 0132 05/28/21 0338 05/29/21 0404  BUN 27* 28* 30* 39* 34*  CREATININE 1.42* 1.45* 1.35* 1.49* 1.26*    Essential hypertension: Not on meds currently was soft last night.  Monitor   History of chronic hypercalcemia with hyperparathyroidism Parathyroid anemia: Seen at [facility] endocrinology recommended outpatient parathyroid surgery.  Electrolytes are stable calcium is slightly on higher side 10.6 Recent Labs  Lab 05/25/21 0453 05/26/21 0546  05/27/21 0132 05/28/21 0338 05/29/21 0404  K 4.8 4.6 4.5 4.4 4.4  CALCIUM 9.9 10.6* 10.6* 10.5* 10.7*  MG 2.5* 2.3 2.2 2.1  --     Coronary artery calcification on CT 04/23/2021: Patient has no chest pain, mobility limited at baseline.  Obtained echocardiogram-EF more than 83%, LV diastolic function could not be evaluated, no regional wall motion abnormalities..EKG with sinus tachycardia frequent PVC, RBBB present on previous EKG in 5/1.    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 She is on prn colchicine at home Class I Obesity:Patient's Body mass index is 34.01 kg/m.:Will benefit with PCP follow-up, weight loss  healthy lifestyle and outpatient sleep evaluation.  DVT prophylaxis: SCDs Start: 05/24/21 2023 Code Status:   Code Status: Full Code Family Communication: plan of care discussed with patient at bedside.  I had updated patient's son over the phone yesterday. Patient status is: Inpatient because of ongoing management of cholelithiasis/elevated LFTs Level of care: Med-Surg   Dispo: The patient is from: home            Anticipated disposition: home once cleared by GI/surgery  Mobility Assessment (last 72 hours)     Mobility Assessment     Row Name 05/28/21 2048 05/28/21 1651 05/28/21 1600 05/28/21 0900 05/27/21 0914   Does patient have an order for bedrest or is patient medically unstable No - Continue assessment -- -- No - Continue assessment No - Continue assessment   What is the highest level of mobility based on the progressive mobility assessment? Level 2 (Chairfast) - Balance while sitting on edge of bed and cannot stand Level 2 (Chairfast) - Balance while sitting on edge of bed and cannot stand Level 2 (Chairfast) - Balance while sitting on edge of bed and cannot stand -- Level 3 (Stands with assist) - Balance while standing  and cannot march in place   Is the above level different from baseline mobility prior  to current illness? Yes - Recommend PT order -- -- -- Yes - Recommend PT order           Objective: Vitals last 24 hrs: Vitals:   05/29/21 0137 05/29/21 0500 05/29/21 0509 05/29/21 0915  BP: 126/72  125/82 116/69  Pulse: 81  94 90  Resp:   18 16  Temp:   98.2 F (36.8 C) 98 F (36.7 C)  TempSrc:      SpO2:   97% 100%  Weight:  87.1 kg    Height:       Weight change: 3 kg  Physical Examination: General exam: AA, appears ill looking, frail older than stated age, weak appearing. HEENT:Oral mucosa moist, Ear/Nose WNL grossly, dentition normal. Respiratory system: bilaterally diminished, obese, no use of accessory muscle Cardiovascular system: S1 & S2 +, No JVD,. Gastrointestinal system: Abdomen soft,NT,ND, BS+ Nervous System:Alert, awake, moving extremities and grossly nonfocal Extremities: edema neg,distal peripheral pulses palpable.  Skin: No rashes,no icterus. MSK: Normal muscle bulk,tone, power  Medications reviewed:  Scheduled Meds:  diclofenac Sodium  2 g Topical TID   nicotine  14 mg Transdermal Daily   Continuous Infusions:  lactated ringers 75 mL/hr at 05/29/21 0542      Diet Order             Diet full liquid Room service appropriate? Yes; Fluid consistency: Thin  Diet effective now  Intake/Output Summary (Last 24 hours) at 05/29/2021 1008 Last data filed at 05/29/2021 0800 Gross per 24 hour  Intake 2812.21 ml  Output 1250 ml  Net 1562.21 ml   Net IO Since Admission: 3,100.74 mL [05/29/21 1008]  Wt Readings from Last 3 Encounters:  05/29/21 87.1 kg  05/11/21 88.5 kg  04/23/21 88 kg     Unresulted Labs (From admission, onward)     Start     Ordered   05/28/21 0832  Occult blood card to lab, stool  Once,   R        05/28/21 0831   05/27/21 1121  Hemochromatosis DNA-PCR(c282y,h63d)  Once,   R        05/27/21 1121   05/26/21 0500  Comprehensive metabolic panel  Daily,   R      05/25/21 0749   05/26/21 0500  CBC  Daily,   R       05/25/21 0749          Data Reviewed: I have personally reviewed following labs and imaging studies CBC: Recent Labs  Lab 05/24/21 1830 05/25/21 0453 05/26/21 0546 05/27/21 0132 05/28/21 0338 05/28/21 1524 05/29/21 0404  WBC 7.6 7.0 9.5 9.2 8.2  --  8.1  NEUTROABS 3.9 3.9  --   --   --   --   --   HGB 8.7* 7.5* 8.2* 7.5* 6.4* 7.9* 8.7*  HCT 28.5* 23.8* 26.6* 23.7* 21.4* 24.9* 27.4*  MCV 98.3 97.1 96.0 98.3 100.5*  --  95.1  PLT 383 360 397 335 292  --  269   Basic Metabolic Panel: Recent Labs  Lab 05/25/21 0453 05/26/21 0546 05/27/21 0132 05/28/21 0338 05/29/21 0404  NA 134* 134* 135 135 137  K 4.8 4.6 4.5 4.4 4.4  CL 107 107 110 109 110  CO2 20* 19* 19* 20* 20*  GLUCOSE 94 127* 114* 92 97  BUN 27* 28* 30* 39* 34*  CREATININE 1.42* 1.45* 1.35* 1.49* 1.26*  CALCIUM 9.9 10.6* 10.6* 10.5* 10.7*  MG 2.5* 2.3 2.2 2.1  --    GFR: Estimated Creatinine Clearance: 39.8 mL/min (A) (by C-G formula based on SCr of 1.26 mg/dL (H)). Liver Function Tests: Recent Labs  Lab 05/25/21 0453 05/26/21 0546 05/27/21 0132 05/28/21 0338 05/29/21 0404  AST 974* 1,147* 976* 830* 739*  ALT 272* 299* 270* 249* 256*  ALKPHOS 447* 426* 334* 310* 333*  BILITOT 3.2* 3.5* 3.0* 2.7* 2.9*  PROT 7.7 8.6* 7.8 7.0 7.3  ALBUMIN 1.5* 1.7* 1.6* <1.5* 1.6*   Recent Labs  Lab 05/24/21 1830 05/25/21 0453 05/27/21 0132  LIPASE 138* 120* 73*   No results for input(s): AMMONIA in the last 168 hours. Coagulation Profile: Recent Labs  Lab 05/24/21 1830 05/26/21 1408  INR 1.0 1.2   BNP (last 3 results) No results for input(s): PROBNP in the last 8760 hours. HbA1C: No results for input(s): HGBA1C in the last 72 hours. CBG: No results for input(s): GLUCAP in the last 168 hours. Lipid Profile: No results for input(s): CHOL, HDL, LDLCALC, TRIG, CHOLHDL, LDLDIRECT in the last 72 hours. Thyroid Function Tests: No results for input(s): TSH, T4TOTAL, FREET4, T3FREE, THYROIDAB in the  last 72 hours. Sepsis Labs: No results for input(s): PROCALCITON, LATICACIDVEN in the last 168 hours.  No results found for this or any previous visit (from the past 240 hour(s)).  Antimicrobials: Anti-infectives (From admission, onward)    None      Culture/Microbiology    Component Value Date/Time  SDES  05/11/2021 1658    URINE, CLEAN CATCH Performed at [facility], [address] [place], [place] [address]    [facility]  05/11/2021 1658    NONE Performed at [facility], [address] [place], [place] [address]    CULT (A) 05/11/2021 1658    <10,000 COLONIES/mL INSIGNIFICANT GROWTH Performed at [facility] [address] [place], [place] [address]    REPTSTATUS 05/13/2021 FINAL 05/11/2021 1658    Other culture-see note  Radiology Studies: DG Cervical Spine 2 or 3 views  Result Date: 05/27/2021 CLINICAL DATA:  Neck pain, no injury or fall EXAM: CERVICAL SPINE - 2-3 VIEW COMPARISON:  None Available. FINDINGS: The cervical spine is visualized from the skull base through C6. There is no evidence of cervical spine fracture or prevertebral soft tissue swelling. Alignment is normal. No other significant bone abnormalities are identified. IMPRESSION: No acute fracture in the imaged cervical spine. Electronically Signed   By: Merilyn Baba M.D.   On: 05/27/2021 16:49   CT CERVICAL SPINE WO CONTRAST  Result Date: 05/28/2021 CLINICAL DATA:  Chronic neck pain. EXAM: CT CERVICAL SPINE WITHOUT CONTRAST TECHNIQUE: Multidetector CT imaging of the cervical spine was performed without intravenous contrast. Multiplanar CT image reconstructions were also generated. RADIATION DOSE REDUCTION: This exam was performed according to the departmental dose-optimization program which includes automated exposure control, adjustment of the mA and/or kV according to patient size and/or use of iterative reconstruction technique. COMPARISON:  None Available. FINDINGS:  Alignment: Normal. There is dextroconvex curvature of the cervical spine which may be positional. Skull base and vertebrae: No acute fracture. No primary bone lesion or focal pathologic process. Bones are osteopenic. Soft tissues and spinal canal: No prevertebral fluid or swelling. No visible canal hematoma. Disc levels: Disc spaces are preserved. There are mild degenerative endplate osteophytes at C4-C5 and C5-C6. There is no significant central canal or neural foraminal stenosis at any level. Upper chest: Mild emphysematous changes are present. Other: None. IMPRESSION: No acute fracture or malalignment. Minimal degenerative changes. Electronically Signed   By: Ronney Asters M.D.   On: 05/28/2021 23:15     LOS: 5 days   Antonieta Pert, MD [facility]  05/29/2021, 10:08 AM

## 2021-05-30 ENCOUNTER — Inpatient Hospital Stay (HOSPITAL_COMMUNITY): Payer: Medicare Other | Admitting: Certified Registered Nurse Anesthetist

## 2021-05-30 ENCOUNTER — Encounter (HOSPITAL_COMMUNITY)
Admission: EM | Disposition: A | Discharge status: Skilled Nursing Facility | Payer: Self-pay | Source: Skilled Nursing Facility | Attending: Internal Medicine

## 2021-05-30 ENCOUNTER — Encounter (HOSPITAL_COMMUNITY): Payer: Self-pay | Admitting: Internal Medicine

## 2021-05-30 DIAGNOSIS — I10 Essential (primary) hypertension: Secondary | ICD-10-CM

## 2021-05-30 DIAGNOSIS — R7989 Other specified abnormal findings of blood chemistry: Secondary | ICD-10-CM | POA: Diagnosis not present

## 2021-05-30 DIAGNOSIS — D649 Anemia, unspecified: Secondary | ICD-10-CM

## 2021-05-30 DIAGNOSIS — K635 Polyp of colon: Secondary | ICD-10-CM

## 2021-05-30 DIAGNOSIS — K317 Polyp of stomach and duodenum: Secondary | ICD-10-CM

## 2021-05-30 HISTORY — PX: COLONOSCOPY WITH PROPOFOL: SHX5780

## 2021-05-30 HISTORY — PX: BIOPSY: SHX5522

## 2021-05-30 HISTORY — PX: IMPACTION REMOVAL: SHX5858

## 2021-05-30 HISTORY — PX: POLYPECTOMY: SHX5525

## 2021-05-30 HISTORY — PX: ESOPHAGOGASTRODUODENOSCOPY (EGD) WITH PROPOFOL: SHX5813

## 2021-05-30 LAB — CBC
HCT: 28.5 % — ABNORMAL LOW (ref 36.0–46.0)
Hemoglobin: 8.8 g/dL — ABNORMAL LOW (ref 12.0–15.0)
MCH: 29.8 pg (ref 26.0–34.0)
MCHC: 30.9 g/dL (ref 30.0–36.0)
MCV: 96.6 fL (ref 80.0–100.0)
Platelets: 257 10*3/uL (ref 150–400)
RBC: 2.95 MIL/uL — ABNORMAL LOW (ref 3.87–5.11)
RDW: 21.1 % — ABNORMAL HIGH (ref 11.5–15.5)
WBC: 7.7 10*3/uL (ref 4.0–10.5)
nRBC: 1 % — ABNORMAL HIGH (ref 0.0–0.2)

## 2021-05-30 LAB — COMPREHENSIVE METABOLIC PANEL
ALT: 239 U/L — ABNORMAL HIGH (ref 0–44)
AST: 600 U/L — ABNORMAL HIGH (ref 15–41)
Albumin: 1.6 g/dL — ABNORMAL LOW (ref 3.5–5.0)
Alkaline Phosphatase: 392 U/L — ABNORMAL HIGH (ref 38–126)
Anion gap: 7 (ref 5–15)
BUN: 29 mg/dL — ABNORMAL HIGH (ref 8–23)
CO2: 20 mmol/L — ABNORMAL LOW (ref 22–32)
Calcium: 10.5 mg/dL — ABNORMAL HIGH (ref 8.9–10.3)
Chloride: 113 mmol/L — ABNORMAL HIGH (ref 98–111)
Creatinine, Ser: 1.12 mg/dL — ABNORMAL HIGH (ref 0.44–1.00)
GFR, Estimated: 51 mL/min — ABNORMAL LOW (ref >60)
Glucose, Bld: 130 mg/dL — ABNORMAL HIGH (ref 70–99)
Potassium: 4.4 mmol/L (ref 3.5–5.1)
Sodium: 140 mmol/L (ref 135–145)
Total Bilirubin: 2.8 mg/dL — ABNORMAL HIGH (ref 0.3–1.2)
Total Protein: 7.3 g/dL (ref 6.5–8.1)

## 2021-05-30 LAB — CERULOPLASMIN: Ceruloplasmin: 40.5 mg/dL — ABNORMAL HIGH (ref 19.0–39.0)

## 2021-05-30 SURGERY — ESOPHAGOGASTRODUODENOSCOPY (EGD) WITH PROPOFOL
Anesthesia: Monitor Anesthesia Care

## 2021-05-30 MED ORDER — SENNOSIDES-DOCUSATE SODIUM 8.6-50 MG PO TABS: 1.0000 | ORAL_TABLET | Freq: Every evening | ORAL | Status: DC | PRN

## 2021-05-30 MED ORDER — LACTATED RINGERS IV SOLN
INTRAVENOUS | Status: AC | PRN
Start: 2021-05-30 — End: 2021-05-30
  Administered 2021-05-30: 20 mL/h via INTRAVENOUS

## 2021-05-30 MED ORDER — PROPOFOL 10 MG/ML IV BOLUS
INTRAVENOUS | Status: DC | PRN
Start: 2021-05-30 — End: 2021-05-30
  Administered 2021-05-30 (×2): 10 mg via INTRAVENOUS

## 2021-05-30 MED ORDER — PROPOFOL 500 MG/50ML IV EMUL
INTRAVENOUS | Status: DC | PRN
  Administered 2021-05-30: 150 ug/kg/min via INTRAVENOUS

## 2021-05-30 MED ORDER — COLCHICINE 0.6 MG PO TABS
0.6000 mg | ORAL_TABLET | Freq: Every day | ORAL | Status: DC
  Administered 2021-05-30: 0.6 mg via ORAL
  Filled 2021-05-30: qty 1

## 2021-05-30 MED ORDER — LIDOCAINE 2% (20 MG/ML) 5 ML SYRINGE
INTRAMUSCULAR | Status: DC | PRN
  Administered 2021-05-30: 60 mg via INTRAVENOUS

## 2021-05-30 MED ORDER — PHENYLEPHRINE 80 MCG/ML (10ML) SYRINGE FOR IV PUSH (FOR BLOOD PRESSURE SUPPORT)
PREFILLED_SYRINGE | INTRAVENOUS | Status: DC | PRN
  Administered 2021-05-30: 80 ug via INTRAVENOUS
  Administered 2021-05-30: 120 ug via INTRAVENOUS
  Administered 2021-05-30 (×5): 80 ug via INTRAVENOUS

## 2021-05-30 SURGICAL SUPPLY — 25 items

## 2021-05-30 NOTE — Anesthesia Procedure Notes (Signed)
Procedure Name: MAC Date/Time: 05/30/2021 9:37 AM Performed by: Janene Harvey, CRNA Pre-anesthesia Checklist: Patient identified, Emergency Drugs available, Suction available and Patient being monitored Patient Re-evaluated:Patient Re-evaluated prior to induction Oxygen Delivery Method: Nasal cannula Induction Type: IV induction Placement Confirmation: positive ETCO2 Dental Injury: Teeth and Oropharynx as per pre-operative assessment

## 2021-05-30 NOTE — Op Note (Signed)
[facility] Patient Name: Danielle Zamora Procedure Date : 05/30/2021 MRN: [id number] Attending MD: Georgian Co ,  Date of Birth: [birth year] CSN: [id number] Age: 77 Admit Type: Inpatient Procedure:                Upper GI endoscopy Indications:              Anemia Providers:                Adline Mango" Georga Hacking, RN, Gloris Ham, Technician Referring MD:             Hospitalist team Medicines:                Monitored Anesthesia Care Complications:            No immediate complications. Estimated Blood Loss:     Estimated blood loss was minimal. Procedure:                Pre-Anesthesia Assessment:                           - Prior to the procedure, a History and Physical                            was performed, and patient medications and                            allergies were reviewed. The patient's tolerance of                            previous anesthesia was also reviewed. The risks                            and benefits of the procedure and the sedation                            options and risks were discussed with the patient.                            All questions were answered, and informed consent                            was obtained. Prior Anticoagulants: The patient has                            taken no previous anticoagulant or antiplatelet                            agents. ASA Grade Assessment: II - A patient with                            mild systemic disease. After reviewing the risks  and benefits, the patient was deemed in                            satisfactory condition to undergo the procedure.                           After obtaining informed consent, the endoscope was                            passed under direct vision. Throughout the                            procedure, the patient's blood pressure, pulse, and                            oxygen  saturations were monitored continuously. The                            [id number]-[id number] ([id number] Olympus endoscope was introduced                            through the mouth, and advanced to the second part                            of duodenum. The upper GI endoscopy was                            accomplished without difficulty. The patient                            tolerated the procedure well. Scope In: Scope Out: Findings:      The examined esophagus was normal.      Multiple 3 to 6 mm sessile polyps with no bleeding and no stigmata of       recent bleeding were found in the cardia and in the gastric fundus.       These polyps were removed with a cold snare. Resection and retrieval       were complete.      Biopsies were taken with a cold forceps on the greater curvature of the       gastric body, on the lesser curvature of the gastric body, at the       incisura, on the greater curvature of the gastric antrum and on the       lesser curvature of the gastric antrum for histology.      The examined duodenum was normal. Biopsies were taken with a cold       forceps for histology. Impression:               - Normal esophagus.                           - Multiple gastric polyps. Resected and retrieved.                           - Normal examined duodenum. Biopsied.                           -  Biopsies were taken with a cold forceps for                            histology on the greater curvature of the gastric                            body, on the lesser curvature of the gastric body,                            at the incisura, on the greater curvature of the                            gastric antrum and on the lesser curvature of the                            gastric antrum. Recommendation:           - Await pathology results.                           - Perform a colonoscopy today. Procedure Code(s):        --- Professional ---                           [phone],  Esophagogastroduodenoscopy, flexible,                            transoral; with removal of tumor(s), polyp(s), or                            other lesion(s) by snare technique                           43239, 34, Esophagogastroduodenoscopy, flexible,                            transoral; with biopsy, single or multiple Diagnosis Code(s):        --- Professional ---                           K31.7, Polyp of stomach and duodenum                           D64.9, Anemia, unspecified CPT copyright 2019 [employer] Association. All rights reserved. The codes documented in this report are preliminary and upon coder review may  be revised to meet current compliance requirements. Danielle Zamora "[name],  05/30/2021 10:05:39 AM Number of Addenda: 0

## 2021-05-30 NOTE — TOC Transition Note (Signed)
Transition of Care [facility]) - CM/SW Discharge Note   Patient Details  Name: Danielle Zamora MRN: [id number] Date of Birth: 1945-12-11  Transition of Care [facility]) CM/SW Contact:  Amador Cunas, [place] Phone Number: 05/30/2021, 2:35 PM   Clinical Narrative:   Pt for dc back to [facility] today. Spoke to Keller at [facility] who confirmed they are prepared to admit pt and that the dc summary was received. RN provided with number for report and PTAR arranged for transport. SW signing off at dc.   Wandra Feinstein, MSW, LCSW [phone] (coverage)      Final next level of care: Skilled Nursing Facility Barriers to Discharge: No Barriers Identified   Patient Goals and CMS Choice        Discharge Placement              Patient chooses bed at: [facility] Patient to be transferred to facility by: [place] Name of family member notified: [name]/Son Patient and family notified of of transfer: 05/30/21  Discharge Plan and Services                                     Social Determinants of Health (SDOH) Interventions     Readmission Risk Interventions     View : No data to display.

## 2021-05-30 NOTE — Anesthesia Preprocedure Evaluation (Addendum)
Anesthesia Evaluation  Patient identified by MRN, date of birth, ID band Patient awake    Reviewed: Allergy & Precautions, H&P , NPO status , Patient's Chart, lab work & pertinent test results  Airway Mallampati: III  TM Distance: >3 FB Neck ROM: Full    Dental no notable dental hx. (+) Edentulous Upper, Edentulous Lower, Dental Advisory Given   Pulmonary Current Smoker and Patient abstained from smoking.,    Pulmonary exam normal breath sounds clear to auscultation       Cardiovascular hypertension, negative cardio ROS   Rhythm:Regular Rate:Normal     Neuro/Psych negative neurological ROS  negative psych ROS   GI/Hepatic negative GI ROS, Neg liver ROS,   Endo/Other  negative endocrine ROS  Renal/GU Renal InsufficiencyRenal disease  negative genitourinary   Musculoskeletal   Abdominal   Peds  Hematology  (+) Blood dyscrasia, anemia ,   Anesthesia Other Findings   Reproductive/Obstetrics negative OB ROS                            Anesthesia Physical Anesthesia Plan  ASA: 2  Anesthesia Plan: MAC   Post-op Pain Management: Minimal or no pain anticipated   Induction: Intravenous  PONV Risk Score and Plan: 1 and Propofol infusion  Airway Management Planned: Natural Airway and Nasal Cannula  Additional Equipment:   Intra-op Plan:   Post-operative Plan:   Informed Consent: I have reviewed the patients History and Physical, chart, labs and discussed the procedure including the risks, benefits and alternatives for the proposed anesthesia with the patient or authorized representative who has indicated his/her understanding and acceptance.     Dental advisory given  Plan Discussed with: CRNA  Anesthesia Plan Comments:         Anesthesia Quick Evaluation

## 2021-05-30 NOTE — Interval H&P Note (Signed)
History and Physical Interval Note:  05/30/2021 9:34 AM  Selby Slovacek  has presented today for surgery, with the diagnosis of Anemia.  The various methods of treatment have been discussed with the patient and family. After consideration of risks, benefits and other options for treatment, the patient has consented to  Procedure(s): ESOPHAGOGASTRODUODENOSCOPY (EGD) WITH PROPOFOL (N/A) COLONOSCOPY WITH PROPOFOL (N/A) as a surgical intervention.  The patient's history has been reviewed, patient examined, no change in status, stable for surgery.  I have reviewed the patient's chart and labs.  Questions were answered to the patient's satisfaction.     Sharyn Creamer

## 2021-05-30 NOTE — Progress Notes (Signed)
Attempted to call report several times, no answer.

## 2021-05-30 NOTE — Discharge Summary (Signed)
Physician Discharge Summary  Danielle Zamora YQI:[id number] DOB: 07-16-1945 DOA: 05/24/2021  PCP: Pleas Koch, NP  Admit date: 05/24/2021 Discharge date: 05/30/2021 Recommendations for Outpatient Follow-up:  Follow up with PCP in 1 weeks-call for appointment Follow-up with gastroenterologist with your LFT check next week  Discharge Dispo: Skilled nursing facility Discharge Condition: Stable Code Status:   Code Status: Full Code Diet recommendation:  Diet Order             Diet regular Room service appropriate? Yes; Fluid consistency: Thin  Diet effective now                    Brief/Interim Summary: HTN, CKD stage IIIb, anemia of chronic disease, pernicious anemia, hypothyroidism recently discharged to skilled nursing home after spending several days at [facility] after sustaining mechanical fall complicated by acute kidney injury on 5/5.  At the facility she was noted to have elevated LFTs which was confirmed on the laboratory care.  Right upper quadrant ultrasound showed gallstones but no evidence of acute cholecystitis.  MRCP showed gallstones but no evidence of ductal dilation, mild pancreatitis.  GI and general surgery consulted.  MRCP with possible mild acute pancreatitis but no evidence of ductal dilatation, may have recently passed a gallstone.  Patient managed with IV fluids, pain medication.  Acute hepatitis respiratory panel negative.  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  Patient had anemia needing 1 unit blood transfusion anemia panel shows anemia of chronic disease ferritin elevated.  Patient has had neck pain cervical spine shows arthritis being managed symptomatically, followed by PT OT recommending skilled nursing facility  Discussed plan of care with patient's son agreeable for outpatient follow-up.  Patient is stable for  discharge to facility today.  GI will follow-up outpatient with LFTs.  She had EGD and colonoscopy done today.  Discharge Diagnoses:  Principal Problem:   Elevated LFTs Active Problems:   Cholelithiasis   Essential hypertension   Stage 3b chronic kidney disease (HCC)   Anemia due to chronic kidney disease   Elevated bilirubin  Elevated LFTs Cholelithiasis Presumed acute biliary pancreatitis: RUQ ultrasound gallstone no evidence of acute cholecystitis but MRCP showed mild acute pancreatitis no evidence of ductal dilatation, may have recently passed a gall stone.  Acute hepatitis panel negative.  General surgery and GI following.  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  Continue plan as per GI.Surgery discussed lap cholecystectomy if patient has a symptoms but at this time no real evidence and signed off. Gi plans to fu with lfts as outpatient- instruction advised for SNF.  Statin discontinued at discharge.  Anemia due to chronic kidney disease Acute drop in hemoglobin: Had drop in hb-without obvious blood loss, hemoglobin improved with 1 unit PRBC.  B12 normal, ferritin elevated suspect anemia of chronic disease. Hemoccult pending. S/p EGD and colonoscopy-EGD was essentially normal.  Biopsies were taken. 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  Stage 3b chronic kidney disease: Renal function is stable.  Monitor Recent Labs  Lab 05/26/21 0546 05/27/21 0132 05/28/21 0338 05/29/21 0404 05/30/21 0201  BUN 28* 30* 39* 34* 29*  CREATININE 1.45* 1.35* 1.49* 1.26* 1.12*    Essential hypertension: Not on meds currently was soft last night.  Monitor   History of chronic hypercalcemia with hyperparathyroidism Parathyroid anemia: Seen at [facility] endocrinology recommended outpatient parathyroid surgery.  Electrolytes are stable calcium is slightly on higher side 10.6 Recent Labs  Lab 05/25/21 0453 05/26/21 0546 05/27/21 0132 05/28/21 0338 05/29/21 0404 05/30/21 0201  K 4.8 4.6 4.5 4.4 4.4 4.4  CALCIUM 9.9 10.6* 10.6* 10.5* 10.7* 10.5*  MG 2.5* 2.3 2.2 2.1  --   --     Coronary artery calcification on CT 04/23/2021: Patient has no chest pain, mobility limited at baseline.  Obtained echocardiogram-EF more than 38%, LV diastolic function could not be evaluated, no regional wall motion abnormalities..EKG with sinus tachycardia frequent PVC, RBBB present on previous EKG in 5/1.    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 Class I Obesity:Patient's Body mass index is 34.01 kg/m.:Will benefit with PCP follow-up, weight loss  healthy lifestyle and outpatient sleep evaluation.  Consults: GI, general surgery Subjective: Alert awake oriented resting comfortably.  Discharge Exam: Vitals:   05/30/21 1100 05/30/21 1117  BP: 116/62 (!) 141/76  Pulse: 83 80  Resp: 12   Temp: 97.9 F (36.6 C) 97.6 F (36.4 C)  SpO2: 100% 95%   General: Pt is alert, awake, not in acute distress Cardiovascular: RRR, S1/S2 +, no rubs, no gallops Respiratory: CTA bilaterally, no wheezing, no rhonchi Abdominal: Soft, NT, ND, bowel sounds + Extremities: no edema, no cyanosis  Discharge  Instructions  Discharge Instructions     Discharge instructions   Complete by: As directed    Follow-up with GI as outpatient, check LFTs in 4-5 days  Please call call MD or return to ER for similar or worsening recurring problem that brought you to hospital or if any fever,nausea/vomiting,abdominal pain, uncontrolled pain, chest pain,  shortness of breath or any other alarming symptoms.  Please follow-up your doctor as instructed in a week time and call the office for appointment.  Please avoid alcohol, smoking, or any other illicit substance and maintain healthy habits including taking your regular medications as prescribed.  You were cared for by a hospitalist during your hospital stay. If you have any questions about your discharge medications or the care you received while you were in the hospital after you are discharged, you can call the unit and ask to speak with the hospitalist on call if the hospitalist that took care of you is not available.  Once you are discharged, your primary care physician will handle any further medical issues. Please note that NO REFILLS for any discharge medications will be authorized once you are discharged, as it is imperative that you return to your primary care physician (or establish a relationship with a primary care physician if you do not have one) for your aftercare needs so that they can reassess your need for medications and monitor your lab values   Increase activity slowly   Complete by: As directed  Allergies as of 05/30/2021       Reactions   Penicillins Swelling   Has patient had a PCN reaction causing immediate rash, facial/tongue/throat swelling, SOB or lightheadedness with hypotension: Yes Has patient had a PCN reaction causing severe rash involving mucus membranes or skin necrosis: Yes Has patient had a PCN reaction that required hospitalization: No Has patient had a PCN reaction occurring within the last 10 years: No If all of  the above answers are "NO", then may proceed with Cephalosporin use.        Medication List     STOP taking these medications    atorvastatin 40 MG tablet Commonly known as: LIPITOR       TAKE these medications    colchicine 0.6 MG tablet Take 2 tablets by mouth at gout flare onset, take 1 additional tablet 2 hours later.  Then take 1 tablet twice daily until gout flare resolves. What changed:  how much to take how to take this when to take this   Magnesium 500 MG Tabs Take 500 mg by mouth daily.   nicotine 14 mg/24hr patch Commonly known as: NICODERM CQ - dosed in mg/24 hours Place 1 patch (14 mg total) onto the skin daily.   raloxifene 60 MG tablet Commonly known as: EVISTA Take 60 mg by mouth daily.   senna-docusate 8.6-50 MG tablet Commonly known as: Senokot-S Take 1 tablet by mouth at bedtime as needed for moderate constipation.   traMADol 50 MG tablet Commonly known as: ULTRAM Take 50 mg by mouth daily as needed for moderate pain.        Follow-up Information     Pleas Koch, NP Follow up in 1 week(s).   Specialty: Internal Medicine Contact information: [place] [address] [phone]                Allergies  Allergen Reactions   Penicillins Swelling    Has patient had a PCN reaction causing immediate rash, facial/tongue/throat swelling, SOB or lightheadedness with hypotension: Yes Has patient had a PCN reaction causing severe rash involving mucus membranes or skin necrosis: Yes Has patient had a PCN reaction that required hospitalization: No Has patient had a PCN reaction occurring within the last 10 years: No If all of the above answers are "NO", then may proceed with Cephalosporin use.     The results of significant diagnostics from this hospitalization (including imaging, microbiology, ancillary and laboratory) are listed below for reference.    Microbiology: No results found for this or any previous  visit (from the past 240 hour(s)).  Procedures/Studies: DG Cervical Spine 2 or 3 views  Result Date: 05/27/2021 CLINICAL DATA:  Neck pain, no injury or fall EXAM: CERVICAL SPINE - 2-3 VIEW COMPARISON:  None Available. FINDINGS: The cervical spine is visualized from the skull base through C6. There is no evidence of cervical spine fracture or prevertebral soft tissue swelling. Alignment is normal. No other significant bone abnormalities are identified. IMPRESSION: No acute fracture in the imaged cervical spine. Electronically Signed   By: Merilyn Baba M.D.   On: 05/27/2021 16:49   DG Pelvis 1-2 Views  Result Date: 05/11/2021 CLINICAL DATA:  [place] yesterday. EXAM: PELVIS - 1-2 VIEW COMPARISON:  None Available. FINDINGS: There is no evidence of pelvic fracture or diastasis. No pelvic bone lesions are seen. Degenerative changes of both hips. 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 IMPRESSION: 1. No acute osseous abnormality. Electronically Signed   By: Huntley Dec  Derry M.D.   On: 05/11/2021 12:38   CT CERVICAL SPINE WO CONTRAST  Result Date: 05/28/2021 CLINICAL DATA:  Chronic neck pain. EXAM: CT CERVICAL SPINE WITHOUT CONTRAST TECHNIQUE: Multidetector CT imaging of the cervical spine was performed without intravenous contrast. Multiplanar CT image reconstructions were also generated. RADIATION DOSE REDUCTION: This exam was performed according to the departmental dose-optimization program which includes automated exposure control, adjustment of the mA and/or kV according to patient size and/or use of iterative reconstruction technique. COMPARISON:  None Available. FINDINGS: Alignment: Normal. There is dextroconvex curvature of the cervical spine which may be positional. Skull base and vertebrae: No acute fracture. No primary bone lesion or focal pathologic process. Bones are osteopenic. Soft tissues and spinal canal: No prevertebral fluid or swelling. No visible canal hematoma. Disc levels: Disc spaces are preserved.  There are mild degenerative endplate osteophytes at C4-C5 and C5-C6. There is no significant central canal or neural foraminal stenosis at any level. Upper chest: Mild emphysematous changes are present. Other: None. IMPRESSION: No acute fracture or malalignment. Minimal degenerative changes. Electronically Signed   By: Ronney Asters M.D.   On: 05/28/2021 23:15   DG Chest Port 1 View  Result Date: 05/25/2021 CLINICAL DATA:  Dyspnea. EXAM: PORTABLE CHEST 1 VIEW COMPARISON:  August 10, 2016. FINDINGS: Stable cardiomegaly. Both lungs are clear. The visualized skeletal structures are unremarkable. IMPRESSION: No active disease. Electronically Signed   By: Marijo Conception M.D.   On: 05/25/2021 11:35   MR ABDOMEN MRCP W WO CONTAST  Result Date: 05/25/2021 CLINICAL DATA:  Cholelithiasis EXAM: MRI ABDOMEN WITHOUT AND WITH CONTRAST (INCLUDING MRCP) TECHNIQUE: Multiplanar multisequence MR imaging of the abdomen was performed both before and after the administration of intravenous contrast. Heavily T2-weighted images of the biliary and pancreatic ducts were obtained, and three-dimensional MRCP images were rendered by post processing. CONTRAST:  15m GADAVIST GADOBUTROL 1 MMOL/ML IV SOLN COMPARISON:  Right upper quadrant ultrasound dated 05/24/2021 FINDINGS: Lower chest: Lung bases are clear. Hepatobiliary: Liver is within normal limits. No suspicious/enhancing hepatic lesions. Layering small gallstones (series 4/image 19), without gallbladder wall thickening or inflammatory changes. No intrahepatic or extrahepatic ductal dilatation. Common duct measures 4 mm. No choledocholithiasis is seen. Pancreas: Very mild peripancreatic fluid/stranding along the pancreatic tail, suggesting mild acute pancreatitis. No pancreatic mass or ductal dilatation. Spleen:  Within normal limits. Adrenals/Urinary Tract: 2.8 cm left adrenal nodule with signal loss on opposed phase imaging, compatible with a benign adrenal adenoma. Right adrenal  glands are within normal limits. Right renal cortical scarring/atrophy. Bilateral renal cysts, measuring up to 3.6 cm in the left upper kidney. No hydronephrosis. Stomach/Bowel: Stomach is within normal limits. Visualized bowel is unremarkable. Vascular/Lymphatic:  No evidence of abdominal aortic aneurysm. No suspicious abdominal lymphadenopathy. Other:  No abdominal ascites. Musculoskeletal: No focal osseous lesions. IMPRESSION: Cholelithiasis, without associated inflammatory changes to suggest acute cholecystitis. No intrahepatic or extrahepatic ductal dilatation. Common duct measures 4 mm. No choledocholithiasis is seen. Very mild peripancreatic fluid/stranding along the pancreatic tail, correlate for mild acute pancreatitis. Benign left adrenal adenoma. Electronically Signed   By: SJulian HyM.D.   On: 05/25/2021 01:32   ECHOCARDIOGRAM COMPLETE  Result Date: 05/26/2021    ECHOCARDIOGRAM REPORT   Patient Name:   VJAKEIRA SEEMANDate of Exam: 05/26/2021 Medical Rec #:  [id number]    Height:       63.0 in Accession #:    [id number]   Weight:       180.0 lb  Date of Birth:  Jan 19, 1945     BSA:          1.849 m Patient Age:    77 years      BP:           131/84 mmHg Patient Gender: F             HR:           88 bpm. Exam Location:  Inpatient Procedure: 2D Echo, Cardiac Doppler and Color Doppler Indications:    Abnormal ECG R94.31  History:        Patient has no prior history of Echocardiogram examinations.                 Chronic kidney stage stage IIIb, anemia chronic kidney disease,                 history of pernicious anemia, history of hyperparathyroidism.  Sonographer:    Darlina Sicilian RDCS Referring Phys: [id number] [place] IMPRESSIONS  1. Left ventricular ejection fraction, by estimation, is >75%. The left ventricle has hyperdynamic function. The left ventricle has no regional wall motion abnormalities. Left ventricular diastolic function could not be evaluated.  2. Right ventricular systolic  function is normal. The right ventricular size is normal.  3. The mitral valve was not well visualized. No evidence of mitral valve regurgitation. No evidence of mitral stenosis.  4. The aortic valve is tricuspid. There is mild calcification of the aortic valve. Aortic valve regurgitation is not visualized. Aortic valve sclerosis/calcification is present, without any evidence of aortic stenosis.  5. Aortic dilatation noted. There is mild dilatation of the ascending aorta, measuring 41 mm.  6. The inferior vena cava is normal in size with greater than 50% respiratory variability, suggesting right atrial pressure of 3 mmHg. Comparison(s): No prior Echocardiogram. Conclusion(s)/Recommendation(s): Hyperdynamic LV with LV cavity peak gradient 47 mmHg. Consider high flow state vs hypovolemia. FINDINGS  Left Ventricle: LV cavity peak gradient 47 mmHg. Left ventricular ejection fraction, by estimation, is >75%. The left ventricle has hyperdynamic function. The left ventricle has no regional wall motion abnormalities. The left ventricular internal cavity  size was normal in size. Suboptimal image quality limits for assessment of left ventricular hypertrophy. Left ventricular diastolic function could not be evaluated due to nondiagnostic images. Left ventricular diastolic function could not be evaluated. Right Ventricle: The right ventricular size is normal. Right vetricular wall thickness was not well visualized. Right ventricular systolic function is normal. Left Atrium: Left atrial size was not well visualized. Right Atrium: Right atrial size was not well visualized. Pericardium: There is no evidence of pericardial effusion. Presence of epicardial fat layer. Mitral Valve: The mitral valve was not well visualized. No evidence of mitral valve regurgitation. No evidence of mitral valve stenosis. Tricuspid Valve: The tricuspid valve is not well visualized. Tricuspid valve regurgitation is trivial. No evidence of tricuspid  stenosis. Aortic Valve: The aortic valve is tricuspid. There is mild calcification of the aortic valve. Aortic valve regurgitation is not visualized. Aortic valve sclerosis/calcification is present, without any evidence of aortic stenosis. Aortic valve mean gradient measures 5.0 mmHg. Aortic valve peak gradient measures 9.9 mmHg. Aortic valve area, by VTI measures 2.81 cm. Pulmonic Valve: The pulmonic valve was not well visualized. Pulmonic valve regurgitation is trivial. No evidence of pulmonic stenosis. Aorta: Aortic dilatation noted. There is mild dilatation of the ascending aorta, measuring 41 mm. Venous: The inferior vena cava is normal in size with greater than 50%  respiratory variability, suggesting right atrial pressure of 3 mmHg. IAS/Shunts: The interatrial septum was not well visualized.  LEFT VENTRICLE PLAX 2D LVIDd:         4.00 cm LVIDs:         3.10 cm LV PW:         1.00 cm LV IVS:        1.20 cm LVOT diam:     1.90 cm LV SV:         62 LV SV Index:   33 LVOT Area:     2.84 cm  RIGHT VENTRICLE RV S prime:     14.90 cm/s LEFT ATRIUM             Index LA diam:        2.70 cm 1.46 cm/m LA Vol (A2C):   33.7 ml 18.23 ml/m LA Vol (A4C):   41.6 ml 22.50 ml/m LA Biplane Vol: 37.9 ml 20.50 ml/m  AORTIC VALVE AV Area (Vmax):    2.78 cm AV Area (Vmean):   2.86 cm AV Area (VTI):     2.81 cm AV Vmax:           157.00 cm/s AV Vmean:          102.000 cm/s AV VTI:            0.219 m AV Peak Grad:      9.9 mmHg AV Mean Grad:      5.0 mmHg LVOT Vmax:         154.00 cm/s LVOT Vmean:        103.000 cm/s LVOT VTI:          0.217 m LVOT/AV VTI ratio: 0.99  AORTA Ao Root diam: 3.00 cm Ao Asc diam:  4.10 cm  SHUNTS Systemic VTI:  0.22 m Systemic Diam: 1.90 cm Buford Dresser MD Electronically signed by Buford Dresser MD Signature Date/Time: 05/26/2021/8:01:33 PM    Final    DG Femur Min 2 Views Right  Result Date: 05/11/2021 CLINICAL DATA:  Right leg pain after fall yesterday. EXAM: RIGHT FEMUR 2  VIEWS COMPARISON:  None Available. FINDINGS: No acute fracture or dislocation. Degenerative changes of the right hip and knee. Soft tissues are unremarkable. IMPRESSION: 1. No acute osseous abnormality. Electronically Signed   By: Titus Dubin M.D.   On: 05/11/2021 12:36   US Abdomen Limited RUQ (LIVER/GB)  Result Date: 05/24/2021 CLINICAL DATA:  Transaminitis EXAM: ULTRASOUND ABDOMEN LIMITED RIGHT UPPER QUADRANT COMPARISON:  None Available. FINDINGS: Gallbladder: 7 mm gallstone. No gallbladder wall thickening or pericholecystic fluid. Negative sonographic Murphy's sign. Common bile duct: Diameter: 4 mm Liver: At the upper limits of normal for parenchymal echogenicity. No focal hepatic lesion is seen. Portal vein is patent on color Doppler imaging with normal direction of blood flow towards the liver. Other: None. IMPRESSION: Cholelithiasis, without associated inflammatory changes to suggest acute cholecystitis. Electronically Signed   By: Julian Hy M.D.   On: 05/24/2021 20:37    Labs: BNP (last 3 results) Recent Labs    05/25/21 0435  BNP 03.4   Basic Metabolic Panel: Recent Labs  Lab 05/25/21 0453 05/26/21 0546 05/27/21 0132 05/28/21 0338 05/29/21 0404 05/30/21 0201  NA 134* 134* 135 135 137 140  K 4.8 4.6 4.5 4.4 4.4 4.4  CL 107 107 110 109 110 113*  CO2 20* 19* 19* 20* 20* 20*  GLUCOSE 94 127* 114* 92 97 130*  BUN 27* 28* 30* 39* 34* 29*  CREATININE 1.42* 1.45* 1.35* 1.49* 1.26* 1.12*  CALCIUM 9.9 10.6* 10.6* 10.5* 10.7* 10.5*  MG 2.5* 2.3 2.2 2.1  --   --    Liver Function Tests: Recent Labs  Lab 05/26/21 0546 05/27/21 0132 05/28/21 0338 05/29/21 0404 05/30/21 0201  AST 1,147* 976* 830* 739* 600*  ALT 299* 270* 249* 256* 239*  ALKPHOS 426* 334* 310* 333* 392*  BILITOT 3.5* 3.0* 2.7* 2.9* 2.8*  PROT 8.6* 7.8 7.0 7.3 7.3  ALBUMIN 1.7* 1.6* <1.5* 1.6* 1.6*   Recent Labs  Lab 05/24/21 1830 05/25/21 0453 05/27/21 0132  LIPASE 138* 120* 73*   No results  for input(s): AMMONIA in the last 168 hours. CBC: Recent Labs  Lab 05/24/21 1830 05/25/21 0453 05/26/21 0546 05/27/21 0132 05/28/21 0338 05/28/21 1524 05/29/21 0404 05/30/21 0201  WBC 7.6 7.0 9.5 9.2 8.2  --  8.1 7.7  NEUTROABS 3.9 3.9  --   --   --   --   --   --   HGB 8.7* 7.5* 8.2* 7.5* 6.4* 7.9* 8.7* 8.8*  HCT 28.5* 23.8* 26.6* 23.7* 21.4* 24.9* 27.4* 28.5*  MCV 98.3 97.1 96.0 98.3 100.5*  --  95.1 96.6  PLT 383 360 397 335 292  --  275 257   Cardiac Enzymes: No results for input(s): CKTOTAL, CKMB, CKMBINDEX, TROPONINI in the last 168 hours. BNP: Invalid input(s): POCBNP CBG: No results for input(s): GLUCAP in the last 168 hours. D-Dimer No results for input(s): DDIMER in the last 72 hours. Hgb A1c No results for input(s): HGBA1C in the last 72 hours. Lipid Profile No results for input(s): CHOL, HDL, LDLCALC, TRIG, CHOLHDL, LDLDIRECT in the last 72 hours. Thyroid function studies No results for input(s): TSH, T4TOTAL, T3FREE, THYROIDAB in the last 72 hours.  Invalid input(s): FREET3 Anemia work up Recent Labs    05/28/21 0726  VITAMINB12 647  FOLATE 18.0  FERRITIN 3,122*  TIBC 221*  IRON 88  RETICCTPCT 6.9*   Urinalysis    Component Value Date/Time   COLORURINE AMBER (A) 05/24/2021 2330   APPEARANCEUR CLEAR 05/24/2021 2330   LABSPEC 1.014 05/24/2021 2330   PHURINE 6.0 05/24/2021 2330   GLUCOSEU NEGATIVE 05/24/2021 2330   HGBUR LARGE (A) 05/24/2021 2330   BILIRUBINUR NEGATIVE 05/24/2021 2330   KETONESUR NEGATIVE 05/24/2021 2330   PROTEINUR 100 (A) 05/24/2021 2330   NITRITE NEGATIVE 05/24/2021 2330   LEUKOCYTESUR NEGATIVE 05/24/2021 2330   Sepsis Labs Invalid input(s): PROCALCITONIN,  WBC,  LACTICIDVEN Microbiology No results found for this or any previous visit (from the past 240 hour(s)).   Time coordinating discharge: 35 minutes  SIGNED: Antonieta Pert, MD  [facility] 05/30/2021, 1:54 PM  If 7PM-7AM, please contact  night-coverage [email or web address]

## 2021-05-30 NOTE — Anesthesia Postprocedure Evaluation (Signed)
Anesthesia Post Note  Patient: Danielle Zamora  Procedure(s) Performed: ESOPHAGOGASTRODUODENOSCOPY (EGD) WITH PROPOFOL COLONOSCOPY WITH PROPOFOL BIOPSY POLYPECTOMY IMPACTION REMOVAL     Patient location during evaluation: PACU Anesthesia Type: MAC Level of consciousness: awake and alert Pain management: pain level controlled Vital Signs Assessment: post-procedure vital signs reviewed and stable Respiratory status: spontaneous breathing, nonlabored ventilation and respiratory function stable Cardiovascular status: stable and blood pressure returned to baseline Postop Assessment: no apparent nausea or vomiting Anesthetic complications: no   No notable events documented.  Last Vitals:  Vitals:   05/30/21 1100 05/30/21 1117  BP: 116/62 (!) 141/76  Pulse: 83 80  Resp: 12   Temp: 36.6 C 36.4 C  SpO2: 100% 95%    Last Pain:  Vitals:   05/30/21 1117  TempSrc: Oral  PainSc: 0-No pain                 Alfard Cochrane,W. EDMOND

## 2021-05-30 NOTE — NC FL2 (Signed)
[place] LEVEL OF CARE SCREENING TOOL     IDENTIFICATION  Patient Name: Danielle Zamora Birthdate: 1945-11-12 Sex: female Admission Date (Current Location): 05/24/2021  [facility] and [place] Number:  Herbalist and Address:  [facility], [place] [address], [place], [place] [address]      Provider Number: [id number]  Attending Physician Name and Address:  Antonieta Pert, MD  Relative Name and Phone Number:       Current Level of Care: Hospital Recommended Level of Care: [place] Prior Approval Number:    Date Approved/Denied:   PASRR Number: [id number]  Discharge Plan: SNF    Current Diagnoses: Patient Active Problem List   Diagnosis Date Noted   Elevated bilirubin    Elevated LFTs 05/24/2021   Cholelithiasis 05/24/2021   Anemia due to chronic kidney disease 05/11/2021   Aortic atherosclerosis ([place]) 02/24/2021   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   Hyperparathyroidism due to renal insufficiency ([place]) 06/25/2020   Stage 3b chronic kidney disease ([place]) 06/25/2020   Unspecified abnormal findings in urine 06/25/2020   Gout 11/25/2019   Hyperlipidemia 11/25/2019   Osteoporosis 01/21/2019   Vitamin D deficiency 01/21/2019   Tobacco abuse 01/21/2019   Prediabetes 01/21/2019   CKD (chronic kidney disease) stage 4, GFR 15-29 ml/min ([place]) 10/09/2017   Hyperparathyroidism ([place]) 10/09/2017   Essential hypertension 08/31/2016   Pernicious anemia 08/11/2016    Orientation RESPIRATION BLADDER Height & Weight     Self, Time, Situation, Place  Normal External catheter Weight: 192 lb 0.3 oz (87.1 kg) Height:  '5\' 3"'$  (160 cm)  BEHAVIORAL SYMPTOMS/MOOD NEUROLOGICAL BOWEL NUTRITION STATUS      Incontinent    AMBULATORY STATUS COMMUNICATION OF NEEDS Skin   Extensive Assist Verbally Normal                       Personal Care Assistance Level of Assistance  Bathing, Dressing Bathing Assistance: Maximum assistance Feeding  assistance: Limited assistance Dressing Assistance: Maximum assistance     Functional Limitations Info  Sight, Hearing, Speech Sight Info: Impaired Hearing Info: Adequate Speech Info: Adequate    SPECIAL CARE FACTORS FREQUENCY  PT (By licensed PT), OT (By licensed OT)                    Contractures Contractures Info: Not present    Additional Factors Info  Code Status Code Status Info: FULL             Current Medications (05/30/2021):  This is the current hospital active medication list Current Facility-Administered Medications  Medication Dose Route Frequency Provider Last Rate Last Admin   colchicine tablet 0.6 mg  0.6 mg Oral Daily Kc, Ramesh, MD       dextromethorphan-guaiFENesin (MUCINEX DM) 30-600 MG per 12 hr tablet 1 tablet  1 tablet Oral BID PRN Amin, Ankit Chirag, MD       diclofenac Sodium (VOLTAREN) 1 % topical gel 2 g  2 g Topical TID Kc, Ramesh, MD   2 g at 05/30/21 1119   hydrALAZINE (APRESOLINE) injection 10 mg  10 mg Intravenous Q4H PRN Amin, Ankit Chirag, MD       ipratropium-albuterol (DUONEB) 0.5-2.5 (3) MG/3ML nebulizer solution 3 mL  3 mL Nebulization Q4H PRN Amin, Ankit Chirag, MD       methocarbamol (ROBAXIN) tablet 500 mg  500 mg Oral Q6H PRN Antonieta Pert, MD   500 mg at 05/27/21 1820  metoprolol tartrate (LOPRESSOR) injection 5 mg  5 mg Intravenous Q4H PRN Amin, Ankit Chirag, MD       nicotine (NICODERM CQ - dosed in mg/24 hours) patch 14 mg  14 mg Transdermal Daily Kristopher Oppenheim, DO   14 mg at 05/30/21 1306   ondansetron (ZOFRAN) tablet 4 mg  4 mg Oral Q6H PRN Kristopher Oppenheim, DO       Or   ondansetron [facility]) injection 4 mg  4 mg Intravenous Q6H PRN Kristopher Oppenheim, DO       oxyCODONE (Oxy IR/ROXICODONE) immediate release tablet 5 mg  5 mg Oral Q4H PRN Amin, Ankit Chirag, MD   5 mg at 05/30/21 0009   senna-docusate (Senokot-S) tablet 1 tablet  1 tablet Oral QHS PRN Amin, Ankit Chirag, MD       traMADol (ULTRAM) tablet 50 mg  50 mg Oral Q6H PRN Kc,  Ramesh, MD       traZODone (DESYREL) tablet 50 mg  50 mg Oral QHS PRN Amin, Jeanella Flattery, MD   50 mg at 05/27/21 2210     Discharge Medications: Please see discharge summary for a list of discharge medications.  Relevant Imaging Results:  Relevant Lab Results:   Additional Information QAS:[id number]  Amador Cunas, [place]

## 2021-05-30 NOTE — Transfer of Care (Signed)
Immediate Anesthesia Transfer of Care Note  Patient: Danielle Zamora  Procedure(s) Performed: ESOPHAGOGASTRODUODENOSCOPY (EGD) WITH PROPOFOL COLONOSCOPY WITH PROPOFOL BIOPSY POLYPECTOMY IMPACTION REMOVAL  Patient Location: PACU  Anesthesia Type:MAC  Level of Consciousness: awake and patient cooperative  Airway & Oxygen Therapy: Patient Spontanous Breathing and Patient connected to nasal cannula oxygen  Post-op Assessment: Report given to RN and Post -op Vital signs reviewed and stable  Post vital signs: Reviewed and stable  Last Vitals:  Vitals Value Taken Time  BP 106/64 05/30/21 1045  Temp 36.6 C 05/30/21 1045  Pulse 83 05/30/21 1056  Resp 13 05/30/21 1056  SpO2 100 % 05/30/21 1056  Vitals shown include unvalidated device data.  Last Pain:  Vitals:   05/30/21 1045  TempSrc:   PainSc: 0-No pain      Patients Stated Pain Goal: 0 (37/16/96 7893)  Complications: No notable events documented.

## 2021-05-30 NOTE — Op Note (Signed)
[facility] Patient Name: Danielle Zamora Procedure Date : 05/30/2021 MRN: [id number] Attending MD: Georgian Co ,  Date of Birth: 26-Feb-1945 CSN: [id number] Age: 76 Admit Type: Inpatient Procedure:                Colonoscopy Indications:              Anemia Providers:                Adline Mango" Georga Hacking, RN, Gloris Ham, Technician Referring MD:             Hospitalist team Medicines:                Monitored Anesthesia Care Complications:            No immediate complications. Estimated Blood Loss:     Estimated blood loss was minimal. Procedure:                Pre-Anesthesia Assessment:                           - Prior to the procedure, a History and Physical                            was performed, and patient medications and                            allergies were reviewed. The patient's tolerance of                            previous anesthesia was also reviewed. The risks                            and benefits of the procedure and the sedation                            options and risks were discussed with the patient.                            All questions were answered, and informed consent                            was obtained. Prior Anticoagulants: The patient has                            taken no previous anticoagulant or antiplatelet                            agents. ASA Grade Assessment: II - A patient with                            mild systemic disease. After reviewing the risks  and benefits, the patient was deemed in                            satisfactory condition to undergo the procedure.                           After obtaining informed consent, the colonoscope                            was passed under direct vision. Throughout the                            procedure, the patient's blood pressure, pulse, and                            oxygen  saturations were monitored continuously. The                            CF-HQ190L ([id number]) Olympus colonoscope was                            introduced through the anus and advanced to the the                            cecum, identified by appendiceal orifice and                            ileocecal valve. The colonoscopy was performed                            without difficulty. The patient tolerated the                            procedure well. The quality of the bowel                            preparation was inadequate. The ileocecal valve,                            appendiceal orifice, and rectum were photographed. Scope In: 10:11:07 AM Scope Out: 10:26:12 AM Scope Withdrawal Time: 0 hours 11 minutes 21 seconds  Total Procedure Duration: 0 hours 15 minutes 5 seconds  Findings:      A moderate amount of stool was found in the entire colon, making       visualization difficult. Lavage of the area was performed using a large       amount, resulting in incomplete clearance with fair visualization.       Manual disimpaction was performed with removal of a large amount of       stool.      A 10 mm polyp was found in the transverse colon. The polyp was sessile.       The polyp was removed with a cold snare. Resection and retrieval were       complete. Impression:               -  Preparation of the colon was inadequate.                           - Stool in the entire examined colon. Manual                            disimpaction performed with removal of large amount                            of stool.                           - One 10 mm polyp in the transverse colon, removed                            with a cold snare. Resected and retrieved. Recommendation:           - Return patient to hospital ward for ongoing care.                           - This exam was able to exclude large colon masses.                           - No sources of blood loss were visualized.                             Patient's anemia is not fully consistent with IDA                            so I do not feel that further inpatient work up is                            warranted.                           - Can discuss outpatient colonoscopy for colon                            cancer screening.                           - Await pathology results.                           - The findings and recommendations were discussed                            with the patient. Procedure Code(s):        --- Professional ---                           [phone], Colonoscopy, flexible; with removal of                            tumor(s), polyp(s), or other lesion(s) by snare  technique Diagnosis Code(s):        --- Professional ---                           K63.5, Polyp of colon                           D64.9, Anemia, unspecified CPT copyright 2019 American Medical Association. All rights reserved. The codes documented in this report are preliminary and upon coder review may  be revised to meet current compliance requirements. [name] "[name],  05/30/2021 10:40:22 AM Number of Addenda: 0

## 2021-05-31 ENCOUNTER — Other Ambulatory Visit: Payer: Self-pay

## 2021-05-31 ENCOUNTER — Telehealth: Payer: Self-pay

## 2021-05-31 ENCOUNTER — Encounter (HOSPITAL_COMMUNITY): Payer: Self-pay | Admitting: Internal Medicine

## 2021-05-31 DIAGNOSIS — R7989 Other specified abnormal findings of blood chemistry: Secondary | ICD-10-CM

## 2021-05-31 NOTE — Telephone Encounter (Signed)
-----   Message from Sharyn Creamer, MD sent at 05/30/2021 12:35 PM EDT ----- Aileen Pilot,  This is a patient that Dr. Bryan Lemma saw as an inpatient. Please arrange for her LFTs to be rechecked in 1 week. Also please arrange for clinic follow up with Dr. Bryan Lemma or APP at the next available clinic appt.   Thanks, CD

## 2021-05-31 NOTE — Telephone Encounter (Signed)
Left message for pt to call back. Order for repeat LFT's placed.

## 2021-06-01 ENCOUNTER — Encounter: Payer: Self-pay | Admitting: Internal Medicine

## 2021-06-01 LAB — SURGICAL PATHOLOGY

## 2021-06-03 NOTE — Telephone Encounter (Signed)
Left message for pt to call back  °

## 2021-06-09 NOTE — Telephone Encounter (Signed)
Left message for pt to call back  °

## 2021-06-10 LAB — HEMOCHROMATOSIS DNA-PCR(C282Y,H63D)

## 2021-06-11 ENCOUNTER — Other Ambulatory Visit: Payer: Self-pay | Admitting: Primary Care

## 2021-06-14 ENCOUNTER — Telehealth: Payer: Self-pay

## 2021-06-14 NOTE — Telephone Encounter (Signed)
Left message to return call to our office.  I have cancelled lab appointment. Will make f/u as soon as we are able to contact patient

## 2021-06-14 NOTE — Telephone Encounter (Signed)
-----   Message from Ellamae Sia sent at 06/14/2021  7:19 AM EDT ----- Regarding: FW: Lab orders for Wednesday, 6.14.23  ----- Message ----- From: Pleas Koch, NP Sent: 06/11/2021   4:41 PM EDT To: Marjorie Smolder, CMA Subject: RE: Lab orders for Wednesday, 6.14.23          I believe this patient is still in the SNF? If not, then she needs a hospital follow-up with me as soon as possible. Please cancel the lab appointment. ----- Message ----- From: Ellamae Sia Sent: 06/09/2021   3:26 PM EDT To: Pleas Koch, NP Subject: Lab orders for Wednesday, 6.14.23              Lab orders, thanks

## 2021-06-16 NOTE — Telephone Encounter (Signed)
Called son who stated that pt was currently at [facility] rehab facility and said to call pt on cell phone at [phone]. Attempted to call pt's cell but was unable to leave voicemail. Baker at [phone] and spoke with scheduler. Pt is scheduled for appt with Tye Savoy on 07/27/21 at 11:30 am. Let scheduler know about lab work that is overdue and asked if they could bring her this week and scheduler said they could do Friday 6/16.

## 2021-06-23 ENCOUNTER — Other Ambulatory Visit: Payer: Medicare Other

## 2021-06-25 NOTE — Telephone Encounter (Signed)
Called patient no answer no voice mail.  

## 2021-07-02 ENCOUNTER — Telehealth: Payer: Self-pay | Admitting: Primary Care

## 2021-07-05 NOTE — Telephone Encounter (Signed)
Patient has appointment on 7/20 is that ok?

## 2021-07-05 NOTE — Telephone Encounter (Signed)
Yes, this should be fine.

## 2021-07-26 ENCOUNTER — Telehealth: Payer: Self-pay | Admitting: Primary Care

## 2021-07-26 NOTE — Telephone Encounter (Signed)
Patient's son called back stating that his mom has been at [facility] for 61 days and does not feel that she is making any progress. [name] stated that something is going on with his mom and he feels that she needs a neurological exam. [name] stated that [facility] will not do a referral for a neurological evaluation and told him that her PCP would have to do that. Patient's son stated that his mom needs a mobility scooter and he was told that her PCP would have to do that also. Patient's son stated that he does not want his mom to stay at [facility] and would like to move her to another facility. [name] wanted the names of some other facilities in the [place] area. [name] was familiar with [facility] [facility] and was going to reach out to them and [facility]. [name] was advised that this message will go back to Allie Bossier NP to review and advise him of her recommendations.

## 2021-07-26 NOTE — Telephone Encounter (Signed)
Patient son called and state patient is in a [facility] home and mobility scooter and neurological evaluation. Call back number [phone].

## 2021-07-26 NOTE — Telephone Encounter (Signed)
Called patient's son and he was on another called and was placed on hold. Phone call was disconnect. Will wait for patient's son to call back.

## 2021-07-27 ENCOUNTER — Ambulatory Visit: Payer: Medicare Other | Admitting: Nurse Practitioner

## 2021-07-27 NOTE — Telephone Encounter (Signed)
Noted.   It looks like she has an appointment scheduled with me for 07/29/21. Is the plan for her to stay in SNF permanently or just for rehab?

## 2021-07-28 NOTE — Telephone Encounter (Signed)
Patient son called back and stated he hasn't gotten a call back from the previous message that was sent out. Call back number [phone].

## 2021-07-28 NOTE — Telephone Encounter (Signed)
Called number it was to a bank and they are not open at this time. Will call back later.

## 2021-07-29 ENCOUNTER — Ambulatory Visit: Payer: Medicare Other | Admitting: Primary Care

## 2021-08-02 ENCOUNTER — Emergency Department (HOSPITAL_COMMUNITY)
Admission: EM | Admit: 2021-08-02 | Discharge: 2021-08-02 | Disposition: A | Discharge status: Home / Self Care | Payer: Medicare Other | Attending: Emergency Medicine | Admitting: Emergency Medicine

## 2021-08-02 ENCOUNTER — Encounter (HOSPITAL_COMMUNITY): Payer: Self-pay

## 2021-08-02 ENCOUNTER — Other Ambulatory Visit: Payer: Self-pay

## 2021-08-02 DIAGNOSIS — R000 Tachycardia, unspecified: Secondary | ICD-10-CM | POA: Diagnosis not present

## 2021-08-02 DIAGNOSIS — R799 Abnormal finding of blood chemistry, unspecified: Secondary | ICD-10-CM | POA: Diagnosis present

## 2021-08-02 DIAGNOSIS — R410 Disorientation, unspecified: Secondary | ICD-10-CM | POA: Insufficient documentation

## 2021-08-02 LAB — CBC WITH DIFFERENTIAL/PLATELET
Abs Immature Granulocytes: 0.04 10*3/uL (ref 0.00–0.07)
Basophils Absolute: 0 10*3/uL (ref 0.0–0.1)
Basophils Relative: 0 %
Eosinophils Absolute: 0.1 10*3/uL (ref 0.0–0.5)
Eosinophils Relative: 1 %
HCT: 37.1 % (ref 36.0–46.0)
Hemoglobin: 11.6 g/dL — ABNORMAL LOW (ref 12.0–15.0)
Immature Granulocytes: 0 %
Lymphocytes Relative: 21 %
Lymphs Abs: 2.1 10*3/uL (ref 0.7–4.0)
MCH: 28.8 pg (ref 26.0–34.0)
MCHC: 31.3 g/dL (ref 30.0–36.0)
MCV: 92.1 fL (ref 80.0–100.0)
Monocytes Absolute: 0.9 10*3/uL (ref 0.1–1.0)
Monocytes Relative: 9 %
Neutro Abs: 7 10*3/uL (ref 1.7–7.7)
Neutrophils Relative %: 69 %
Platelets: 303 10*3/uL (ref 150–400)
RBC: 4.03 MIL/uL (ref 3.87–5.11)
RDW: 14.6 % (ref 11.5–15.5)
WBC: 10.1 10*3/uL (ref 4.0–10.5)
nRBC: 0 % (ref 0.0–0.2)

## 2021-08-02 LAB — COMPREHENSIVE METABOLIC PANEL
ALT: 26 U/L (ref 0–44)
AST: 21 U/L (ref 15–41)
Albumin: 2.6 g/dL — ABNORMAL LOW (ref 3.5–5.0)
Alkaline Phosphatase: 147 U/L — ABNORMAL HIGH (ref 38–126)
Anion gap: 7 (ref 5–15)
BUN: 15 mg/dL (ref 8–23)
CO2: 22 mmol/L (ref 22–32)
Calcium: 11.9 mg/dL — ABNORMAL HIGH (ref 8.9–10.3)
Chloride: 106 mmol/L (ref 98–111)
Creatinine, Ser: 1.05 mg/dL — ABNORMAL HIGH (ref 0.44–1.00)
GFR, Estimated: 55 mL/min — ABNORMAL LOW (ref >60)
Glucose, Bld: 125 mg/dL — ABNORMAL HIGH (ref 70–99)
Potassium: 4.3 mmol/L (ref 3.5–5.1)
Sodium: 135 mmol/L (ref 135–145)
Total Bilirubin: 0.5 mg/dL (ref 0.3–1.2)
Total Protein: 7.6 g/dL (ref 6.5–8.1)

## 2021-08-02 LAB — MAGNESIUM: Magnesium: 2.1 mg/dL (ref 1.7–2.4)

## 2021-08-02 MED ORDER — SODIUM CHLORIDE 0.9 % IV BOLUS
1000.0000 mL | Freq: Once | INTRAVENOUS | Status: AC
  Administered 2021-08-02: 1000 mL via INTRAVENOUS

## 2021-08-02 MED ORDER — TRAMADOL HCL 50 MG PO TABS
50.0000 mg | ORAL_TABLET | Freq: Once | ORAL | Status: AC
  Administered 2021-08-02: 50 mg via ORAL
  Filled 2021-08-02: qty 1

## 2021-08-02 NOTE — ED Notes (Signed)
Patient transferred back to [facility] with [facility].

## 2021-08-02 NOTE — ED Provider Notes (Signed)
[facility] EMERGENCY DEPARTMENT Provider Note   CSN: [id number] Arrival date & time: 08/02/21  1558     History  Chief Complaint  Patient presents with   Abnormal Lab    Danielle Zamora is a 76 y.o. female.  Patient presents from a rehab facility.  Reportedly lab there had noticed hypercalcemia and reported confusion and sent the patient here.  However the patient per EMS is alert and oriented x4 has no complaints of any pain.  On my exam she continues to be pleasant appearing with no complaints of pain.  Denies headache or chest pain or abdominal pain.  She states she had high calcium in the past and had been following up at [facility] but has not seen them in some time now.       Home Medications Prior to Admission medications   Medication Sig Start Date End Date Taking? Authorizing Provider  colchicine 0.6 MG tablet Take 2 tablets by mouth at gout flare onset, take 1 additional tablet 2 hours later.  Then take 1 tablet twice daily until gout flare resolves. Patient taking differently: Take 0.6-1.2 mg by mouth See admin instructions. Take 2 tablets by mouth at gout flare onset, take 1 additional tablet 2 hours later.  Then take 1 tablet twice daily until gout flare resolves. 04/21/21   Pleas Koch, NP  Magnesium 500 MG TABS Take 500 mg by mouth daily.    [provider]  nicotine (NICODERM CQ - DOSED IN MG/24 HOURS) 14 mg/24hr patch Place 1 patch (14 mg total) onto the skin daily. 05/14/21   Fritzi Mandes, MD  raloxifene (EVISTA) 60 MG tablet Take 60 mg by mouth daily. 01/19/21   [provider]  senna-docusate (SENOKOT-S) 8.6-50 MG tablet Take 1 tablet by mouth at bedtime as needed for moderate constipation. 05/30/21   Antonieta Pert, MD  traMADol (ULTRAM) 50 MG tablet Take 50 mg by mouth daily as needed for moderate pain. 05/24/21   [provider]      Allergies    Penicillins    Review of Systems   Review of Systems  Constitutional:   Negative for fever.  HENT:  Negative for ear pain.   Eyes:  Negative for pain.  Respiratory:  Negative for cough.   Cardiovascular:  Negative for chest pain.  Gastrointestinal:  Negative for abdominal pain.  Genitourinary:  Negative for flank pain.  Musculoskeletal:  Negative for back pain.  Skin:  Negative for rash.  Neurological:  Negative for headaches.    Physical Exam Updated Vital Signs BP 126/81   Pulse (!) 108   Temp 98.4 F (36.9 C) (Oral)   Resp (!) 22   Ht '5\' 3"'$  (1.6 m)   Wt 65.3 kg   SpO2 100%   BMI 25.51 kg/m  Physical Exam Constitutional:      General: She is not in acute distress.    Appearance: Normal appearance.  HENT:     Head: Normocephalic.     Nose: Nose normal.  Eyes:     Extraocular Movements: Extraocular movements intact.  Cardiovascular:     Rate and Rhythm: Normal rate.  Pulmonary:     Effort: Pulmonary effort is normal.  Musculoskeletal:        General: Normal range of motion.     Cervical back: Normal range of motion.  Neurological:     General: No focal deficit present.     Mental Status: She is alert and oriented to person,  place, and time. Mental status is at baseline.     Cranial Nerves: No cranial nerve deficit.     Motor: No weakness.     ED Results / Procedures / Treatments   Labs (all labs ordered are listed, but only abnormal results are displayed) Labs Reviewed  CBC WITH DIFFERENTIAL/PLATELET - Abnormal; Notable for the following components:      Result Value   Hemoglobin 11.6 (*)    All other components within normal limits  COMPREHENSIVE METABOLIC PANEL - Abnormal; Notable for the following components:   Glucose, Bld 125 (*)    Creatinine, Ser 1.05 (*)    Calcium 11.9 (*)    Albumin 2.6 (*)    Alkaline Phosphatase 147 (*)    GFR, Estimated 55 (*)    All other components within normal limits  MAGNESIUM    EKG EKG Interpretation  Date/Time:  Monday August 02 2021 16:05:28 EDT Ventricular Rate:  110 PR  Interval:  182 QRS Duration: 147 QT Interval:  353 QTC Calculation: 478 R Axis:   91 Text Interpretation: Sinus tachycardia RBBB and LPFB Confirmed by Thamas Jaegers ([id number]) on 08/02/2021 6:00:15 PM  Radiology No results found.  Procedures Procedures    Medications Ordered in ED Medications  sodium chloride 0.9 % bolus 1,000 mL (1,000 mLs Intravenous New Bag/Given 08/02/21 1823)    ED Course/ Medical Decision Making/ A&P                           Medical Decision Making Amount and/or Complexity of Data Reviewed Labs: ordered.   Review of records shows an office visit June 17, 2021.  She was seen at [facility] May 04, 2022 for hypercalcemia as well.  Cardiac monitoring showing sinus rhythm tachycardic rate.  Labs are sent, calcium elevated 11.9.  Patient otherwise asymptomatic alert and oriented x3.  She has no focal neurodeficit.  Will recommend continued outpatient follow-up with her [facility] facility this week.  Advised her to call tomorrow to schedule an appointment.  Advised immediate return for worsening symptoms fevers pain or any additional concerns.  Patient given a liter bolus of fluids and treatment for her mildly elevated calcium levels.        Final Clinical Impression(s) / ED Diagnoses Final diagnoses:  Hypercalcemia    Rx / DC Orders ED Discharge Orders     None         Luna Fuse, MD 08/02/21 4782

## 2021-08-02 NOTE — ED Notes (Signed)
PTAR called for pt transport back to [facility]. PTAR informed this RN that she is 5th in line

## 2021-08-02 NOTE — ED Triage Notes (Signed)
Patient bib [facility] from [facility] and rehab. The facility called saying pt had an abnormal lab (hypercalcemia) and AMS. Per EMS patient was A&Ox4 with no complaints.  VSS  92-99%RA 150/88  HR 84 R18

## 2021-08-02 NOTE — Discharge Instructions (Signed)
Your calcium level was 11.9 today.  Please call your specialist at [facility] tomorrow with whom you are evaluating your prior episodes of elevated calcium.  Avoid taking any Tums or any other calcium supplements.  Drink plenty of water at home.  Return to the ER if you have any new numbness or weakness fevers vomiting pain or any additional concerns.

## 2021-08-04 NOTE — Telephone Encounter (Signed)
Please thank [name] for the update!  To clarify, is he requesting a motorized scooter or transport wheelchair? I'm always happy to help. We will have to have a face-to-face visit for either request or the insurance will not cover. Since she's at [facility] and has been face-to-face with the providers there, they should be able to write for this. He could ask them.

## 2021-08-04 NOTE — Telephone Encounter (Signed)
Patient's son Danielle Zamora notified as instructed by telephone. Danielle Zamora stated that he wants a rx for a wheelchair that is motorized. Patient stated that he has talked with a person at [facility] and was told that they can give him a script for a wheelchair but not a motorized one. Danielle Zamora stated that he is not getting anywhere with [facility] but he is going to call them back and speak to someone in charge to see if they can help him out. Danielle Zamora stated if he is not able to get what he needs he will call back and see about setting up an appointment here for his mom when she gets out of the SNF.

## 2021-08-04 NOTE — Telephone Encounter (Signed)
Noted  

## 2021-08-04 NOTE — Telephone Encounter (Signed)
Spoke to patient's son and was advised that his mom is not going to be at [facility] permanently. Danielle Zamora stated that the plan is for his mom to come home 08/27/21. Danielle Zamora stated that he [place] out of state and is planning a trip home Monday for about 2 months. Danielle Zamora stated that he would like to pick up a written script for a motorized wheelchair for his mom to have when she gets out of [employer].  Danielle Zamora stated that his mom has missed several appointments and he wants to call the offices and reschedule them when  he gets back into town.

## 2021-08-05 ENCOUNTER — Ambulatory Visit: Payer: Medicare Other | Admitting: Primary Care

## 2021-08-11 NOTE — Telephone Encounter (Signed)
Patient son Danielle Zamora called and and asked about getting a rx for a wheelchair ramp.

## 2021-08-11 NOTE — Telephone Encounter (Signed)
Called family to let them know we will need to have office visit for insurance. Family states that they are paying out of pocket only needs a script to save on tax for it. Let him know that I will check with you and see if they need to have office visit for order.

## 2021-08-11 NOTE — Telephone Encounter (Signed)
Noted.  Is he still asking for the wheelchair? I also see a note about a wheelchair ramp?  Okay to complete those prescriptions.   Any updates on Ms. Dietze?  Is she still at the rehab center?  Will she be returning home at some point?

## 2021-08-12 NOTE — Telephone Encounter (Signed)
Do we need to provide a paper Rx for the wheelchair ramp? I've never written a prescription for this before.

## 2021-08-12 NOTE — Telephone Encounter (Signed)
Is he still asking for the wheelchair? He would like to hold off on this for now I also see a note about a wheelchair ramp? Needs order would like to come by and pick up.    Okay to complete those prescriptions.    Any updates on Ms. Umphlett?  Is she still at the rehab center?  Will she be returning home at some point?  She is still at rehab. She will be coming home soon. They have a appointment with endo today.

## 2021-08-12 NOTE — Telephone Encounter (Signed)
Prescription placed in Danielle Zamora's in box.

## 2021-08-12 NOTE — Telephone Encounter (Signed)
Yes please give paper script

## 2021-08-13 ENCOUNTER — Telehealth: Payer: Self-pay | Admitting: Primary Care

## 2021-08-13 NOTE — Telephone Encounter (Signed)
Patient son Danielle Zamora came in and picked up her script. Thank you!

## 2021-08-13 NOTE — Telephone Encounter (Signed)
Patients son Danielle Zamora has been notified that rx is ready and is up front for pickup

## 2021-08-26 ENCOUNTER — Ambulatory Visit: Payer: Medicare Other | Admitting: Nurse Practitioner

## 2021-09-01 ENCOUNTER — Telehealth: Payer: Self-pay | Admitting: Primary Care

## 2021-09-01 NOTE — Telephone Encounter (Signed)
[name] records have been received. Thank you!

## 2021-09-08 ENCOUNTER — Emergency Department: Payer: Medicare Other

## 2021-09-08 ENCOUNTER — Other Ambulatory Visit: Payer: Self-pay

## 2021-09-08 ENCOUNTER — Emergency Department
Admission: EM | Admit: 2021-09-08 | Discharge: 2021-09-08 | Disposition: A | Discharge status: Skilled Nursing Facility | Payer: Medicare Other | Attending: Emergency Medicine | Admitting: Emergency Medicine

## 2021-09-08 ENCOUNTER — Encounter: Payer: Self-pay | Admitting: *Deleted

## 2021-09-08 DIAGNOSIS — W07XXXA Fall from chair, initial encounter: Secondary | ICD-10-CM | POA: Diagnosis not present

## 2021-09-08 DIAGNOSIS — M25551 Pain in right hip: Secondary | ICD-10-CM | POA: Diagnosis not present

## 2021-09-08 DIAGNOSIS — E213 Hyperparathyroidism, unspecified: Secondary | ICD-10-CM | POA: Insufficient documentation

## 2021-09-08 DIAGNOSIS — S0990XA Unspecified injury of head, initial encounter: Secondary | ICD-10-CM | POA: Diagnosis present

## 2021-09-08 DIAGNOSIS — S0181XA Laceration without foreign body of other part of head, initial encounter: Secondary | ICD-10-CM | POA: Diagnosis not present

## 2021-09-08 DIAGNOSIS — W19XXXA Unspecified fall, initial encounter: Secondary | ICD-10-CM

## 2021-09-08 LAB — CBC WITH DIFFERENTIAL/PLATELET
Abs Immature Granulocytes: 0.05 10*3/uL (ref 0.00–0.07)
Basophils Absolute: 0 10*3/uL (ref 0.0–0.1)
Basophils Relative: 0 %
Eosinophils Absolute: 0 10*3/uL (ref 0.0–0.5)
Eosinophils Relative: 1 %
HCT: 34 % — ABNORMAL LOW (ref 36.0–46.0)
Hemoglobin: 10.5 g/dL — ABNORMAL LOW (ref 12.0–15.0)
Immature Granulocytes: 1 %
Lymphocytes Relative: 21 %
Lymphs Abs: 1.6 10*3/uL (ref 0.7–4.0)
MCH: 26.6 pg (ref 26.0–34.0)
MCHC: 30.9 g/dL (ref 30.0–36.0)
MCV: 86.3 fL (ref 80.0–100.0)
Monocytes Absolute: 0.7 10*3/uL (ref 0.1–1.0)
Monocytes Relative: 9 %
Neutro Abs: 5.3 10*3/uL (ref 1.7–7.7)
Neutrophils Relative %: 68 %
Platelets: 336 10*3/uL (ref 150–400)
RBC: 3.94 MIL/uL (ref 3.87–5.11)
RDW: 14.9 % (ref 11.5–15.5)
WBC: 7.7 10*3/uL (ref 4.0–10.5)
nRBC: 0 % (ref 0.0–0.2)

## 2021-09-08 LAB — COMPREHENSIVE METABOLIC PANEL
ALT: 36 U/L (ref 0–44)
AST: 33 U/L (ref 15–41)
Albumin: 2.6 g/dL — ABNORMAL LOW (ref 3.5–5.0)
Alkaline Phosphatase: 179 U/L — ABNORMAL HIGH (ref 38–126)
Anion gap: 6 (ref 5–15)
BUN: 23 mg/dL (ref 8–23)
CO2: 25 mmol/L (ref 22–32)
Calcium: 13.1 mg/dL (ref 8.9–10.3)
Chloride: 110 mmol/L (ref 98–111)
Creatinine, Ser: 1.05 mg/dL — ABNORMAL HIGH (ref 0.44–1.00)
GFR, Estimated: 55 mL/min — ABNORMAL LOW (ref >60)
Glucose, Bld: 124 mg/dL — ABNORMAL HIGH (ref 70–99)
Potassium: 4.3 mmol/L (ref 3.5–5.1)
Sodium: 141 mmol/L (ref 135–145)
Total Bilirubin: 0.7 mg/dL (ref 0.3–1.2)
Total Protein: 7.7 g/dL (ref 6.5–8.1)

## 2021-09-08 MED ORDER — PREDNISONE 50 MG PO TABS: 50.0000 mg | ORAL_TABLET | Freq: Every day | ORAL | 0 refills | Status: DC

## 2021-09-08 NOTE — ED Provider Triage Note (Signed)
Emergency Medicine Provider Triage Evaluation Note  Danielle Zamora , a 76 y.o. female  was evaluated in triage.  Pt arrives via EMS, but is unsure why she is here. Per report, she fell forward out of a chair and has a forehead laceration.  Physical Exam  There were no vitals taken for this visit. Gen:   Awake, no distress   Resp:  Normal effort  MSK:   Moves extremities without difficulty  Other:  Laceration to the left forehead. Bleeding well controlled.  Medical Decision Making  Medically screening exam initiated at 6:07 PM.  Appropriate orders placed.  Danielle Zamora was informed that the remainder of the evaluation will be completed by another provider, this initial triage assessment does not replace that evaluation, and the importance of remaining in the ED until their evaluation is complete.    Danielle Dike, FNP 09/08/21 [phone]

## 2021-09-08 NOTE — ED Provider Notes (Cosign Needed Addendum)
[facility] Provider Note  Patient Contact: 9:45 PM (approximate)   History   Fall   HPI  Danielle Zamora is a 76 y.o. female who presents the emergency department after a mechanical fall.  Patient fell out of a chair hitting the left side of her forehead.  Patient was complaining of right hip pain as well.  No loss of consciousness is reported.  Bleeding was controlled on my assessment.  Patient states that her hip slightly hurts but her only concern at this time as the forehead laceration.  Patient denies any blurred vision, unilateral weakness, chest pain, shortness of breath, abdominal pain.     Physical Exam   Triage Vital Signs: ED Triage Vitals  Enc Vitals Group     BP 09/08/21 1812 (!) 140/85     Pulse Rate 09/08/21 1812 (!) 102     Resp 09/08/21 1812 18     Temp 09/08/21 1812 98.6 F (37 C)     Temp Source 09/08/21 1812 Oral     SpO2 09/08/21 1812 96 %     Weight 09/08/21 1809 143 lb 4.8 oz (65 kg)     Height 09/08/21 1809 '5\' 3"'$  (1.6 m)     Head Circumference --      Peak Flow --      Pain Score 09/08/21 1809 5     Pain Loc --      Pain Edu? --      Excl. in [place]? --     Most recent vital signs: Vitals:   09/08/21 1812 09/08/21 2122  BP: (!) 140/85 (!) 145/84  Pulse: (!) 102 98  Resp: 18 20  Temp: 98.6 F (37 C) 98.5 F (36.9 C)  SpO2: 96% 98%     General: Alert and in no acute distress. Eyes:  PERRL. EOMI. Head: 0.5 cm laceration to the left forehead.  No bleeding.  No visible foreign body.  No other visible signs of trauma to head or face.  No battle signs, raccoon eyes.  No tenderness or crepitus to the osseous structures of the skull base. Neck: No stridor. No cervical spine tenderness to palpation.  Cardiovascular:  Good peripheral perfusion Respiratory: Normal respiratory effort without tachypnea or retractions. Lungs CTAB. Good air entry to the bases with no decreased or absent breath sounds. Musculoskeletal: Full range of  motion to all extremities.  Slight tenderness over the right hip without palpable abnormality.  No shortening or rotation of the right lower extremity.  No gross visible signs of trauma to the hip. Neurologic:  No gross focal neurologic deficits are appreciated.  Cranial nerves II through XII grossly intact. Skin:   No rash noted Other:   ED Results / Procedures / Treatments   Labs (all labs ordered are listed, but only abnormal results are displayed) Labs Reviewed  COMPREHENSIVE METABOLIC PANEL - Abnormal; Notable for the following components:      Result Value   Glucose, Bld 124 (*)    Creatinine, Ser 1.05 (*)    Calcium 13.1 (*)    Albumin 2.6 (*)    Alkaline Phosphatase 179 (*)    GFR, Estimated 55 (*)    All other components within normal limits  CBC WITH DIFFERENTIAL/PLATELET - Abnormal; Notable for the following components:   Hemoglobin 10.5 (*)    HCT 34.0 (*)    All other components within normal limits     EKG     RADIOLOGY  I personally viewed, evaluated,  and interpreted these images as part of my medical decision making, as well as reviewing the written report by the radiologist.  ED Provider Interpretation: No acute traumatic findings on imaging of the pelvis, femur, knee, CT scan of the head or neck.  DG Knee Complete 4 Views Right  Result Date: 09/08/2021 CLINICAL DATA:  Right knee pain after fall. EXAM: RIGHT KNEE - COMPLETE 4+ VIEW COMPARISON:  None Available. FINDINGS: Images are limited due to positioning and overlying artifact. Mild suprapatellar joint effusion is noted. Moderate narrowing of lateral joint space is noted. Vascular calcifications are noted. No definite fracture is noted. IMPRESSION: Limited exam due to positioning and overlying artifact. Mild suprapatellar joint effusion. 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 No definite fracture is noted. Electronically Signed   By: Marijo Conception M.D.   On: 09/08/2021 17:32   DG Pelvis  1-2 Views  Result Date: 09/08/2021 CLINICAL DATA:  Fall and right lower extremity pain. EXAM: PELVIS - 1-2 VIEW COMPARISON:  Pelvic radiograph dated 05/11/2021. FINDINGS: There is no acute fracture or dislocation. The bones are osteopenic. Mild bilateral hip arthritic changes. Large amount of stool noted in the rectal vault. Probable calcified fibroid in the pelvis. The soft tissues are grossly unremarkable IMPRESSION: No acute fracture or dislocation. Electronically Signed   By: Anner Crete M.D.   On: 09/08/2021 17:30   DG Femur Min 2 Views Right  Result Date: 09/08/2021 CLINICAL DATA:  Fall EXAM: RIGHT FEMUR 2 VIEWS COMPARISON:  05/11/2021 FINDINGS: Negative for fracture of the femur. Degenerative change in the knee. Arterial calcification. Negative left hip. IMPRESSION: Negative for fracture of the right femur. Electronically Signed   By: Franchot Gallo M.D.   On: 09/08/2021 17:30   CT Head Wo Contrast  Result Date: 09/08/2021 CLINICAL DATA:  Provided history: Head trauma, minor.  Neck trauma. EXAM: CT HEAD WITHOUT CONTRAST CT CERVICAL SPINE WITHOUT CONTRAST TECHNIQUE: Multidetector CT imaging of the head and cervical spine was performed following the standard protocol without intravenous contrast. Multiplanar CT image reconstructions of the cervical spine were also generated. RADIATION DOSE REDUCTION: This exam was performed according to the departmental dose-optimization program which includes automated exposure control, adjustment of the mA and/or kV according to patient size and/or use of iterative reconstruction technique. COMPARISON:  CT of the cervical spine 05/28/2021. FINDINGS: CT HEAD FINDINGS Brain: Moderate generalized parenchymal atrophy. Small chronic cortically based infarcts within the right frontal and parietal lobes. Advanced patchy and confluent hypoattenuation within the cerebral white matter, nonspecific but compatible with chronic small vessel disease. Chronic lacunar infarct  within the right thalamus. Chronic infarcts within the bilateral cerebellar hemispheres (left greater than right). Partially empty sella turcica. There is no acute intracranial hemorrhage. No demarcated cortical infarct. No extra-axial fluid collection. No evidence of an intracranial mass. No midline shift. Vascular: No hyperdense vessel. Atherosclerotic calcifications. Skull: No fracture or aggressive osseous lesion. Sinuses/Orbits: No mass or acute finding within the imaged orbits. No significant paranasal sinus disease at the imaged levels. Other: Left forehead hematoma and possible laceration. CT CERVICAL SPINE FINDINGS Alignment: Straightening of the expected cervical lordosis. No significant spondylolisthesis. Skull base and vertebrae: The basion-dental and atlanto-dental intervals are maintained.No evidence of acute fracture to the cervical spine. Soft tissues and spinal canal: No prevertebral fluid or swelling. No visible canal hematoma. Disc levels: Cervical spondylosis with multilevel disc space narrowing, disc bulges/central disc protrusions and uncovertebral hypertrophy. Disc space narrowing is greatest at C5-C6 (moderate at this level). No  appreciable high-grade spinal canal stenosis. No high-grade bony neural foraminal narrowing. Upper chest: No consolidation within the imaged lung apices. No visible pneumothorax. IMPRESSION: CT head: 1. No evidence of acute intracranial abnormality. 2. Left forehead hematoma and possible laceration. 3. Parenchymal atrophy, chronic small vessel ischemic disease and chronic infarcts as described. CT cervical spine: 1. No evidence of acute fracture to the cervical spine. 2. Cervical spondylosis, as described. Electronically Signed   By: Kellie Simmering D.O.   On: 09/08/2021 17:20   CT Cervical Spine Wo Contrast  Result Date: 09/08/2021 CLINICAL DATA:  Provided history: Head trauma, minor.  Neck trauma. EXAM: CT HEAD WITHOUT CONTRAST CT CERVICAL SPINE WITHOUT CONTRAST  TECHNIQUE: Multidetector CT imaging of the head and cervical spine was performed following the standard protocol without intravenous contrast. Multiplanar CT image reconstructions of the cervical spine were also generated. RADIATION DOSE REDUCTION: This exam was performed according to the departmental dose-optimization program which includes automated exposure control, adjustment of the mA and/or kV according to patient size and/or use of iterative reconstruction technique. COMPARISON:  CT of the cervical spine 05/28/2021. FINDINGS: CT HEAD FINDINGS Brain: Moderate generalized parenchymal atrophy. Small chronic cortically based infarcts within the right frontal and parietal lobes. Advanced patchy and confluent hypoattenuation within the cerebral white matter, nonspecific but compatible with chronic small vessel disease. Chronic lacunar infarct within the right thalamus. Chronic infarcts within the bilateral cerebellar hemispheres (left greater than right). Partially empty sella turcica. There is no acute intracranial hemorrhage. No demarcated cortical infarct. No extra-axial fluid collection. No evidence of an intracranial mass. No midline shift. Vascular: No hyperdense vessel. Atherosclerotic calcifications. Skull: No fracture or aggressive osseous lesion. Sinuses/Orbits: No mass or acute finding within the imaged orbits. No significant paranasal sinus disease at the imaged levels. Other: Left forehead hematoma and possible laceration. CT CERVICAL SPINE FINDINGS Alignment: Straightening of the expected cervical lordosis. No significant spondylolisthesis. Skull base and vertebrae: The basion-dental and atlanto-dental intervals are maintained.No evidence of acute fracture to the cervical spine. Soft tissues and spinal canal: No prevertebral fluid or swelling. No visible canal hematoma. Disc levels: Cervical spondylosis with multilevel disc space narrowing, disc bulges/central disc protrusions and uncovertebral  hypertrophy. Disc space narrowing is greatest at C5-C6 (moderate at this level). No appreciable high-grade spinal canal stenosis. No high-grade bony neural foraminal narrowing. Upper chest: No consolidation within the imaged lung apices. No visible pneumothorax. IMPRESSION: CT head: 1. No evidence of acute intracranial abnormality. 2. Left forehead hematoma and possible laceration. 3. Parenchymal atrophy, chronic small vessel ischemic disease and chronic infarcts as described. CT cervical spine: 1. No evidence of acute fracture to the cervical spine. 2. Cervical spondylosis, as described. Electronically Signed   By: Kellie Simmering D.O.   On: 09/08/2021 17:20    PROCEDURES:  Critical Care performed: No  ..Laceration Repair  Date/Time: 09/08/2021 9:45 PM  Performed by: Darletta Moll, PA-C Authorized by: Darletta Moll, PA-C   Consent:    Consent obtained:  Verbal   Consent given by:  Patient   Risks discussed:  Infection, pain, poor cosmetic result and poor wound healing Universal protocol:    Procedure explained and questions answered to patient or proxy's satisfaction: yes     Immediately prior to procedure, a time out was called: yes     Patient identity confirmed:  Verbally with patient Anesthesia:    Anesthesia method:  None Laceration details:    Location:  Face   Face location:  Forehead  Length (cm):  0.5 Exploration:    Limited defect created (wound extended): no     Hemostasis achieved with:  Direct pressure   Imaging outcome: foreign body not noted     Wound exploration: entire depth of wound visualized     Wound extent: no foreign bodies/material noted, no nerve damage noted and no underlying fracture noted     Contaminated: no   Treatment:    Area cleansed with:  Saline and Shur-Clens   Amount of cleaning:  Standard Skin repair:    Repair method:  Tissue adhesive Approximation:    Approximation:  Close Repair type:    Repair type:   Simple Post-procedure details:    Dressing:  Open (no dressing)   Procedure completion:  Tolerated well, no immediate complications    MEDICATIONS ORDERED IN ED: Medications - No data to display   IMPRESSION / MDM / [place] / ED COURSE  I reviewed the triage vital signs and the nursing notes.                              Differential diagnosis includes, but is not limited to, skull fracture, intracranial hemorrhage, cervical spine fracture, extremity fracture, hip dislocation, facial laceration  Patient's presentation is most consistent with acute presentation with potential threat to life or bodily function.   Patient's diagnosis is consistent with fall, forehead laceration.  Patient presented to the emergency department after a mechanical fall.  Patient did fall and hit her head.  No reported loss of consciousness.  Patient did sustain a laceration to the left forehead that was approximately half a centimeter in length.  Patient had Dermabond over this area with good approximation.  Imaging of the head, neck, hip, and right lower extremity were unremarkable for trauma.  At this time patient is stable for discharge.  Follow-up primary care as needed.  Return precautions discussed with the patient..  Patient is given ED precautions to return to the ED for any worsening or new symptoms.  Addendum: Patient has a legal guardian, they were contacted about discharge and requested that we check a calcium level.  Patient has hyperparathyroidism, has a chronically elevated calcium level.  We will check calcium at this time.  Patient's calcium is 13.1.  This is slightly higher than her normal.  She is not having any concerning symptoms currently.  No sensory changes.  Patient does have some chronic kidney disease and as such I will give the patient glucocorticoids for GI elimination of her extra calcium.  No indication for emergent therapies with calcitonin, dialysis.  As such steroid will  be written for the patient and patient will be discharged to long-term care facility.      FINAL CLINICAL IMPRESSION(S) / ED DIAGNOSES   Final diagnoses:  Fall, initial encounter  Laceration of forehead, initial encounter  Hypercalcemia  Hyperparathyroidism ([place])     Rx / DC Orders   ED Discharge Orders          Ordered    predniSONE (DELTASONE) 50 MG tablet  Daily with breakfast        09/08/21 2243             Note:  This document was prepared using Dragon voice recognition software and may include unintentional dictation errors.   Darletta Moll, PA-C 09/08/21 2152    Darletta Moll, PA-C 09/08/21 2248    Rada Hay, MD 09/09/21  1725  

## 2021-09-08 NOTE — ED Triage Notes (Signed)
Pt brought in via ems from [facility].  Pt fell out of a chair.  Pt has laceration to left side of forehead.  No blood thinners, no loc.  Bleeding controlled.  Pt on a stretcher in triage.  Siderails up x 2.  Pt alert

## 2021-09-24 ENCOUNTER — Ambulatory Visit: Payer: Medicare Other | Admitting: Nurse Practitioner

## 2021-09-24 NOTE — Progress Notes (Deleted)
Chief Complaint:  hospital follow up   Assessment &  Plan      HPI   Danielle Zamora is a 76 y.o. female known to Dr.  Bryan Lemma with a past medical history significant for CKD stage III, anemia of chronic kidney disease, history of pernicious anemia, hyperparathyroidism, lithiasis and pancreatitis. See PMH /PSH for additional history   Patient was seen for hospital consultation May 2023 by Ellouise Newer, PA and Dr. Bryan Lemma for evaluation of abdominal pain, nausea/vomiting, elevated liver enzymes ( previously normal) and anemia.    Admission evaluation notable for AST/ALT 1131/309, T. bili 3.9, ALP 537, H/H 8.2/26 (baseline Hgb ~11-12), Lipase 138.  Negative viral hepatitis panel. Negative EtOH. RUQ [place] (5/15): Cholelithiasis without cholecystitis, CBD 4 mm.  MRCP (5/16): Cholelithiasis without cholecystitis, CBD 4 mm without CDL.  Very mild peripancreatic fluid stranding along pancreatic tail.  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  Per our notes patient was supposed to undergo a liver biopsy but I cannot find in the hospital records where that was done?  Her liver chemistries downtrended.    Regarding anemia, she had a history of anemia of chronic disease.  She was not having any overt bleeding but her hemoglobin was down from baseline.   B12 and folate were normal . Ferritin was markedly elevated but hemochromatosis DNA was negative.  She did require a unit of blood.  For further evaluation of acute on chronic anemia she underwent an upper endoscopy and colonoscopy.  See full report below.  In summary the EGD was remarkable for multiple gastric polyps. Pathology >> reactive gastropathy with mild chronic gastritis and intestinal metaplasia.  No H. pylori or dysplasia.  Bowel prep for colonoscopy was inadequate but 10 mm inflammatory polyp was removed    - Was seen by the General Surgery service.  Holding off on ccy with IOC at this  juncture - No role for ERCP at this juncture       Previous GI Evaluation   May 2023 EGD and colonoscopy for worsening anemia  EGD  - Normal esophagus. - Multiple gastric polyps. Resected and retrieved. - Normal examined duodenum. Biopsied. - Multiple gastric biopsies were taken Colonoscopy  - Normal esophagus. - Multiple gastric polyps. Resected and retrieved. - Normal examined duodenum. Biopsied.  A. STOMACH, BIOPSY:  Reactive gastropathy with mild chronic gastritis  Negative for H. pylori, intestinal metaplasia, dysplasia and carcinoma   B. STOMACH, POLYPECTOMY:  Hyperplastic polyp  Reactive gastropathy with mild chronic gastritis and intestinal  metaplasia  Negative for H. pylori, dysplasia and carcinoma   C. COLON, TRANSVERSE, POLYPECTOMY:  Benign inflammatory polyp  Negative for dysplasia and carcinoma    Imaging     Labs:     Latest Ref Rng & Units 09/08/2021   10:05 PM 08/02/2021    5:55 PM 05/30/2021    2:01 AM  CBC  WBC 4.0 - 10.5 K/uL 7.7  10.1  7.7   Hemoglobin 12.0 - 15.0 g/dL 10.5  11.6  8.8   Hematocrit 36.0 - 46.0 % 34.0  37.1  28.5   Platelets 150 - 400 K/uL 336  303  257        Latest Ref Rng & Units 09/08/2021   10:05 PM 08/02/2021    5:55 PM 05/30/2021    2:01 AM  Hepatic Function  Total Protein 6.5 - 8.1 g/dL 7.7  7.6  7.3   Albumin 3.5 - 5.0 g/dL 2.6  2.6  1.6   AST 15 - 41 U/L 33  21  600   ALT 0 - 44 U/L 36  26  239   Alk Phosphatase 38 - 126 U/L 179  147  392   Total Bilirubin 0.3 - 1.2 mg/dL 0.7  0.5  2.8     Hemochromatosis DNA- negative    Past Medical History:  Diagnosis Date   Essential hypertension    Pernicious anemia     Past Surgical History:  Procedure Laterality Date   BIOPSY  05/30/2021   Procedure: BIOPSY;  Surgeon: Sharyn Creamer, MD;  Location: [facility] ENDOSCOPY;  Service: Gastroenterology;;   COLONOSCOPY WITH PROPOFOL N/A 05/30/2021   Procedure: COLONOSCOPY WITH PROPOFOL;  Surgeon: Sharyn Creamer, MD;   Location: [facility] ENDOSCOPY;  Service: Gastroenterology;  Laterality: N/A;   ESOPHAGOGASTRODUODENOSCOPY (EGD) WITH PROPOFOL N/A 05/30/2021   Procedure: ESOPHAGOGASTRODUODENOSCOPY (EGD) WITH PROPOFOL;  Surgeon: Sharyn Creamer, MD;  Location: [facility];  Service: Gastroenterology;  Laterality: N/A;   IMPACTION REMOVAL  05/30/2021   Procedure: IMPACTION REMOVAL;  Surgeon: Sharyn Creamer, MD;  Location[facility] ENDOSCOPY;  Service: Gastroenterology;;   POLYPECTOMY  05/30/2021   Procedure: POLYPECTOMY;  Surgeon: Sharyn Creamer, MD;  Location: [facility] ENDOSCOPY;  Service: Gastroenterology;;    Current Medications, Allergies, Family History and Social History were reviewed in [facility] record.     Current Outpatient Medications  Medication Sig Dispense Refill   colchicine 0.6 MG tablet Take 2 tablets by mouth at gout flare onset, take 1 additional tablet 2 hours later.  Then take 1 tablet twice daily until gout flare resolves. (Patient taking differently: Take 0.6-1.2 mg by mouth See admin instructions. Take 2 tablets by mouth at gout flare onset, take 1 additional tablet 2 hours later.  Then take 1 tablet twice daily until gout flare resolves.) 60 tablet 0   Magnesium 500 MG TABS Take 500 mg by mouth daily.     nicotine (NICODERM CQ - DOSED IN MG/24 HOURS) 14 mg/24hr patch Place 1 patch (14 mg total) onto the skin daily. 28 patch 0   predniSONE (DELTASONE) 50 MG tablet Take 1 tablet (50 mg total) by mouth daily with breakfast. 5 tablet 0   raloxifene (EVISTA) 60 MG tablet Take 60 mg by mouth daily.     senna-docusate (SENOKOT-S) 8.6-50 MG tablet Take 1 tablet by mouth at bedtime as needed for moderate constipation.     traMADol (ULTRAM) 50 MG tablet Take 50 mg by mouth daily as needed for moderate pain.     No current facility-administered medications for this visit.    Review of Systems: No chest pain. No shortness of breath. No urinary complaints.    Physical Exam  Wt Readings  from Last 3 Encounters:  09/08/21 143 lb 4.8 oz (65 kg)  08/02/21 144 lb (65.3 kg)  05/29/21 192 lb 0.3 oz (87.1 kg)    There were no vitals taken for this visit. Constitutional:  Generally well appearing ***female in no acute distress. Psychiatric: Pleasant. Normal mood and affect. Behavior is normal. EENT: Pupils normal.  Conjunctivae are normal. No scleral icterus. Neck supple.  Cardiovascular: Normal rate, regular rhythm. No edema Pulmonary/chest: Effort normal and breath sounds normal. No wheezing, rales or rhonchi. Abdominal: Soft, nondistended, nontender. Bowel sounds active throughout. There are no masses palpable. No hepatomegaly. Neurological: Alert and oriented to person place and time. Skin: Skin is warm and dry. No rashes noted.  Tye Savoy, NP  09/24/2021, 8:39 AM  Cc:  Pleas Koch, NP

## 2021-09-27 ENCOUNTER — Telehealth: Payer: Self-pay

## 2021-09-27 NOTE — Telephone Encounter (Signed)
[place] well gave verbal for new start. Will call back if any questions.

## 2021-09-27 NOTE — Telephone Encounter (Signed)
[place] Night - Client Nonclinical Telephone Record  [facility] Client [facility] Night - Client Client Site [place] - Night Provider Alma Friendly - NP Contact Type Call Who Is Calling Physician / Provider / Hospital Call Type Provider Call Message Only Reason for Call Request to send message to Office Initial Comment Hermenia Fiscal with [facility] has a request d/t Medicare regulations. She is requesting a new start of service for skilled nursing and that will be for 9/19. Pt is Danielle Zamora DOB 08/08/1945. Fax # [phone]. Primary # [phone]. Disp. Time Disposition Final User 09/26/2021 4:03:01 PM General Information Provided Yes [place], [place] Call Closed By: Shireen Quan Transaction Date/Time: 09/26/2021 3:58:03 PM (ET

## 2021-09-27 NOTE — Telephone Encounter (Signed)
Approved.  

## 2021-09-29 ENCOUNTER — Telehealth: Payer: Self-pay | Admitting: Primary Care

## 2021-09-29 DIAGNOSIS — Z720 Tobacco use: Secondary | ICD-10-CM

## 2021-09-29 DIAGNOSIS — M25551 Pain in right hip: Secondary | ICD-10-CM

## 2021-09-29 MED ORDER — NICOTINE 14 MG/24HR TD PT24
14.0000 mg | MEDICATED_PATCH | Freq: Every day | TRANSDERMAL | 0 refills | Status: DC
Start: 2021-09-29 — End: 2022-02-04

## 2021-09-29 MED ORDER — LIDOCAINE 5 % EX PTCH: 1.0000 | MEDICATED_PATCH | CUTANEOUS | 0 refills | Status: DC

## 2021-09-29 NOTE — Telephone Encounter (Signed)
Noted.  I will send refills of the nicotine patches and lidocaine patches to her pharmacy.  What dose of nicotine patches she currently prescribed?  14 mcg?  21 mcg?  Please have her son bring all paperwork from the SNF during her visit scheduled for next week.

## 2021-09-29 NOTE — Telephone Encounter (Signed)
Duplicate.  See other phone note.

## 2021-09-29 NOTE — Telephone Encounter (Signed)
Patient son [name] called in wanting to speak with Joellen. Thank you!

## 2021-09-29 NOTE — Telephone Encounter (Signed)
Called spoke to patient son. He states he is getting sone walled from [facility]. She came home on 9/15. She has been out of the nicotine patch, evista and lidocaine 5% patch. Son states he has been trying to get in touch by phone and email was told that script would be called in but nothing has been sent. Son states that patient took last tramadol yesterday and that is the only thing she has had for pain after discharge. She is in a lot of pain. She has follow up scheduled with you on 9/29. Would like to have refills sent to [facility] [address]

## 2021-09-29 NOTE — Telephone Encounter (Signed)
Patients son states she is prescribed 58mg for nicotine patch. Son was reminded to bring paperwork to visit next week on 9/29.

## 2021-09-29 NOTE — Telephone Encounter (Signed)
Noted. Refill(s) sent to pharmacy.  

## 2021-09-29 NOTE — Telephone Encounter (Signed)
Son called in stating that mom has been released from [facility]. However they did not release her medications,and he's having a hard time talking to someone over there,he would like to speak with Anda Kraft so see what he should do as far as his moms medications.

## 2021-10-04 HISTORY — PX: PARATHYROIDECTOMY: SHX19

## 2021-10-06 ENCOUNTER — Telehealth: Payer: Self-pay | Admitting: Primary Care

## 2021-10-06 NOTE — Telephone Encounter (Signed)
Patient is currently bedridden from recent thyroid surgery on 9/25. Made patient aware you do not do house calls. Appointment on 9/29 is for hospital follow up. Patient wants to know if they should reschedule for next week or do virtual? Please advise.

## 2021-10-06 NOTE — Telephone Encounter (Signed)
Patient son called and asked for the appointment on 10/08/2021 can do an house visit or make it virtual for the hospital follow up. Patient son [name] phone number call back [phone].

## 2021-10-06 NOTE — Telephone Encounter (Signed)
Called and talked to son, he agrees to reschedule for next week. Confirms he has a follow up with surgeon in a few weeks. [name], the son will call back tomorrow to reschedule appointment

## 2021-10-06 NOTE — Telephone Encounter (Signed)
Please thank her son for the update, I do not know that she was bedridden at this time.  Let us plan to reschedule her hospital follow-up for next week when she is feeling better, needs to be scheduled at the end of a session (morning or afternoon).  In person or virtual is fine as long as she is scheduled to follow-up with her surgeon.

## 2021-10-07 NOTE — Telephone Encounter (Signed)
Spoke with patients son, [name]. Hospital follow up scheduled virtually for 9/29.

## 2021-10-08 ENCOUNTER — Encounter: Payer: Self-pay | Admitting: Primary Care

## 2021-10-08 ENCOUNTER — Inpatient Hospital Stay: Payer: Medicare Other | Admitting: Primary Care

## 2021-10-08 ENCOUNTER — Telehealth (INDEPENDENT_AMBULATORY_CARE_PROVIDER_SITE_OTHER): Payer: Medicare Other | Admitting: Primary Care

## 2021-10-08 DIAGNOSIS — E213 Hyperparathyroidism, unspecified: Secondary | ICD-10-CM

## 2021-10-08 DIAGNOSIS — G8929 Other chronic pain: Secondary | ICD-10-CM | POA: Insufficient documentation

## 2021-10-08 DIAGNOSIS — R296 Repeated falls: Secondary | ICD-10-CM

## 2021-10-08 DIAGNOSIS — M25561 Pain in right knee: Secondary | ICD-10-CM

## 2021-10-08 DIAGNOSIS — E892 Postprocedural hypoparathyroidism: Secondary | ICD-10-CM

## 2021-10-08 DIAGNOSIS — R7989 Other specified abnormal findings of blood chemistry: Secondary | ICD-10-CM

## 2021-10-08 DIAGNOSIS — M25562 Pain in left knee: Secondary | ICD-10-CM

## 2021-10-08 MED ORDER — GABAPENTIN 100 MG PO CAPS: ORAL_CAPSULE | ORAL | 0 refills | Status: DC

## 2021-10-08 NOTE — Assessment & Plan Note (Addendum)
She would likely benefit from a cortisone injection to the right knee, discussed this with both patient and son today.  She does plan on following up with orthopedist when she recovers from her surgery.  Reviewed office notes from August 2023 per orthopedics through [facility] through care everywhere.  Prescription for gabapentin 100 mg sent to pharmacy.  We discussed that she can take 1 to 3 capsules by mouth up to 3 times daily but to start slow and work her way up gradually if needed.  Referral placed to home health physical therapy.  Addendum: Patient would benefit from a [facility] lift given chronic knee pain, decrease in ROM, frailty, and recurrent falls.

## 2021-10-08 NOTE — Assessment & Plan Note (Signed)
Presumed to be secondary to passed gallstone.  Hospital notes, imaging, labs reviewed from May 2023. Reviewed LFTs from August 2023 which were back to baseline.

## 2021-10-08 NOTE — Progress Notes (Addendum)
Patient ID: Danielle Zamora, female    DOB: [birth year], 76 y.o.   MRN: [id number]  Virtual visit completed through [employer], a video enabled telemedicine application. Due to national recommendations of social distancing due to COVID-19, a virtual visit is felt to be most appropriate for this patient at this time. Reviewed limitations, risks, security and privacy concerns of performing a virtual visit and the availability of in person appointments. I also reviewed that there may be a patient responsible charge related to this service. The patient agreed to proceed.   Patient location: home Provider location: [place] at [facility], office Persons participating in this virtual visit: patient, provider patient's son  If any vitals were documented, they were collected by patient at home unless specified below.    There were no vitals taken for this visit.   CC: Hospital and SNF Follow up Subjective:   HPI: Danielle Zamora is a 76 y.o. female with a history of hypertension, hyperparathyroidism, osteoporosis, CKD, prediabetes, gout presenting on 10/08/2021 for hospital follow-up.    Her son joins [place] today who is helping to provide information for HPI.  Admitted to [facility] on 05/11/2021 for mechanical fall, CKD with AKI.  During this hospitalization she was treated with IV fluids, physical therapy.  Irbesartan and furosemide were held.  She was discharged on 05/14/2021 to [facility].  Admitted to [facility] on 05/24/2021 from [facility] for elevated LFTs without abdominal pain, acute anemia.  She underwent MRCP which was negative for CBD stone, positive gallstones, mild pancreatitis.  Hepatitis panel was negative.  Elevated LFTs were suspected to be secondary to a passed gallstone, no surgical intervention was done.  She did undergo upper endoscopy and colonoscopy, no active bleeding, masses, or ulcers identified.  She did undergo blood transfusion with 1 unit of PRBC.  She was discharged  back to [facility] on 05/30/2021.  Evaluated at [facility] ED on 08/02/2021 from [facility] for hypercalcemia and confusion.  She was discharged home later that evening with recommendations for endocrinology follow-up for hyperparathyroidism.  Evaluated at [facility] ED on 09/08/2021 for mechanical fall.  She fell out of her chair striking her head on the left side at the forehead region.  Also with hip pain.  She underwent multiple imaging testing including x-rays of the knee, pelvis, femur and CT cervical spine and head. All imaging without acute finding/fracture.  She was discharged back to [facility] later that evening with a prescription for prednisone due to progressively elevating calcium levels.  Evaluated by orthopedics on 08/23/2021 for primary osteoarthritis of bilateral knees.  She underwent left knee aspiration injection of cortisone.  She was discharged from [facility] on 09/26/2021.  Her son accompanied her home.  She underwent parathyroidectomy on 10/04/21 per Dr. Maudie Mercury for chronic hyperparathyroidism.  She has a follow-up visit scheduled with her surgeon in early October 2023.  Today she overall feels well except for chronic bilateral knee pain, right greater than left.  She was provided with hydrocodone from her surgeon upon discharge, this has not helped with her knee pain.  She questions if gabapentin could be an option for her pain.  She denies erythema/swelling to the incision site from her surgery.  She is not bothered by her incision site.  Her son lives out of state, and has arranged 24-hour care for his mother through friends and family.  He plans to return home tomorrow.  They have been contacted by home health nursing/PT, however they prefer  to receive this service through our office.  They are requesting a new referral. She is also needing a hoyer lift as she has become frail and weak, has chronic knee pain with limited ROM, and has sustained multiple falls.         Relevant  past medical, surgical, family and social history reviewed and updated as indicated. Interim medical history since our last visit reviewed. Allergies and medications reviewed and updated. Outpatient Medications Prior to Visit  Medication Sig Dispense Refill   lidocaine (LIDODERM) 5 % Place 1 patch onto the skin daily. Remove & Discard patch within 12 hours or as directed by MD 30 patch 0   Magnesium 500 MG TABS Take 500 mg by mouth daily.     nicotine (NICODERM CQ - DOSED IN MG/24 HOURS) 14 mg/24hr patch Place 1 patch (14 mg total) onto the skin daily. 28 patch 0   senna-docusate (SENOKOT-S) 8.6-50 MG tablet Take 1 tablet by mouth at bedtime as needed for moderate constipation.     colchicine 0.6 MG tablet Take 2 tablets by mouth at gout flare onset, take 1 additional tablet 2 hours later.  Then take 1 tablet twice daily until gout flare resolves. (Patient not taking: Reported on 10/08/2021) 60 tablet 0   raloxifene (EVISTA) 60 MG tablet Take 60 mg by mouth daily. (Patient not taking: Reported on 10/08/2021)     traMADol (ULTRAM) 50 MG tablet Take 50 mg by mouth daily as needed for moderate pain. (Patient not taking: Reported on 10/08/2021)     predniSONE (DELTASONE) 50 MG tablet Take 1 tablet (50 mg total) by mouth daily with breakfast. (Patient not taking: Reported on 10/08/2021) 5 tablet 0   No facility-administered medications prior to visit.     Per HPI unless specifically indicated in ROS section below Review of Systems  Constitutional:  Negative for fever.  Respiratory:  Negative for shortness of breath.   Cardiovascular:  Negative for chest pain.  Musculoskeletal:  Positive for arthralgias. Negative for joint swelling.  Neurological:  Negative for dizziness.   Objective:  There were no vitals taken for this visit.  Wt Readings from Last 3 Encounters:  09/08/21 143 lb 4.8 oz (65 kg)  08/02/21 144 lb (65.3 kg)  05/29/21 192 lb 0.3 oz (87.1 kg)       Physical exam: General: Alert  and oriented x 3, no distress, does not appear sickly  Pulmonary: Speaks in complete sentences without increased work of breathing, no cough during visit.  Psychiatric: Normal mood, thought content, and behavior.  Exam today very limited given virtual platform and inability of the patient to move very well.     Results for orders placed or performed during the hospital encounter of 09/08/21  Comprehensive metabolic panel  Result Value Ref Range   Sodium 141 135 - 145 mmol/L   Potassium 4.3 3.5 - 5.1 mmol/L   Chloride 110 98 - 111 mmol/L   CO2 25 22 - 32 mmol/L   Glucose, Bld 124 (H) 70 - 99 mg/dL   BUN 23 8 - 23 mg/dL   Creatinine, Ser 1.05 (H) 0.44 - 1.00 mg/dL   Calcium 13.1 (HH) 8.9 - 10.3 mg/dL   Total Protein 7.7 6.5 - 8.1 g/dL   Albumin 2.6 (L) 3.5 - 5.0 g/dL   AST 33 15 - 41 U/L   ALT 36 0 - 44 U/L   Alkaline Phosphatase 179 (H) 38 - 126 U/L   Total Bilirubin 0.7 0.3 -  1.2 mg/dL   GFR, Estimated 55 (L) >60 mL/min   Anion gap 6 5 - 15  CBC with Differential  Result Value Ref Range   WBC 7.7 4.0 - 10.5 K/uL   RBC 3.94 3.87 - 5.11 MIL/uL   Hemoglobin 10.5 (L) 12.0 - 15.0 g/dL   HCT 34.0 (L) 36.0 - 46.0 %   MCV 86.3 80.0 - 100.0 fL   MCH 26.6 26.0 - 34.0 pg   MCHC 30.9 30.0 - 36.0 g/dL   RDW 14.9 11.5 - 15.5 %   Platelets 336 150 - 400 K/uL   nRBC 0.0 0.0 - 0.2 %   Neutrophils Relative % 68 %   Neutro Abs 5.3 1.7 - 7.7 K/uL   Lymphocytes Relative 21 %   Lymphs Abs 1.6 0.7 - 4.0 K/uL   Monocytes Relative 9 %   Monocytes Absolute 0.7 0.1 - 1.0 K/uL   Eosinophils Relative 1 %   Eosinophils Absolute 0.0 0.0 - 0.5 K/uL   Basophils Relative 0 %   Basophils Absolute 0.0 0.0 - 0.1 K/uL   Immature Granulocytes 1 %   Abs Immature Granulocytes 0.05 0.00 - 0.07 K/uL   Assessment & Plan:   Problem List Items Addressed This Visit       Endocrine   Hyperparathyroidism ([place])    S/P parathyroidectomy on 10/04/2021 per Dr. Maudie Mercury.  Reviewed office notes and surgical notes  from care everywhere from August and September 2023. Follow-up as scheduled.        Other   Elevated LFTs    Presumed to be secondary to passed gallstone.  Hospital notes, imaging, labs reviewed from May 2023. Reviewed LFTs from August 2023 which were back to baseline.      Recurrent falls    Multiple falls since April 2023. ED and hospital notes reviewed.  Referral placed to home health nursing and physical therapy for evaluation. Her son has arranged for 24-hour care at home.  Agree that she would benefit from [place].      Relevant Orders   Ambulatory referral to Home Health   Chronic pain of both knees - Primary    She would likely benefit from a cortisone injection to the right knee, discussed this with both patient and son today.  She does plan on following up with orthopedist when she recovers from her surgery.  Reviewed office notes from August 2023 per orthopedics through [facility] through care everywhere.  Prescription for gabapentin 100 mg sent to pharmacy.  We discussed that she can take 1 to 3 capsules by mouth up to 3 times daily but to start slow and work her way up gradually if needed.  Referral placed to home health physical therapy.  Addendum: Patient would benefit from a [facility] lift given chronic knee pain, decrease in ROM, frailty, and recurrent falls.      Relevant Medications   gabapentin (NEURONTIN) 100 MG capsule   Other Visit Diagnoses     S/P parathyroidectomy ([place])       Relevant Orders   Ambulatory referral to [place] ordered this encounter  Medications   gabapentin (NEURONTIN) 100 MG capsule    Sig: Take 1-3 capsules by mouth up to three times daily for pain.    Dispense:  90 capsule    Refill:  0    Order Specific Question:   Supervising Provider    Answer:   BEDSOLE, AMY E [[id number]]   Orders  Placed This Encounter  Procedures   Ambulatory referral to Home Health    Referral Priority:   Routine    Referral Type:    Home Health Care    Referral Reason:   Specialty Services Required    Requested Specialty:   [place]    Number of Visits Requested:   1    I discussed the assessment and treatment plan with the patient. The patient was provided an opportunity to ask questions and all were answered. The patient agreed with the plan and demonstrated an understanding of the instructions. The patient was advised to call back or seek an in-person evaluation if the symptoms worsen or if the condition fails to improve as anticipated.  1 hour was spent reviewing hospital charts, meeting with patient and son, formulating assessment and plan.  Follow up plan:  You may take gabapentin 100 mg for your knee pain.  Start with 100 mg once or twice daily, okay to increase to 200 to 300 mg up to 3 times daily.  Work up gradually as discussed.  Follow-up with your surgeon as scheduled.  Schedule a follow-up visit with your orthopedist for your knees.  It was a pleasure to see you today!   Pleas Koch, NP

## 2021-10-08 NOTE — Patient Instructions (Signed)
You may take gabapentin 100 mg for your knee pain.  Start with 100 mg once or twice daily, okay to increase to 200 to 300 mg up to 3 times daily.  Work up gradually as discussed.  Follow-up with your surgeon as scheduled.  Schedule a follow-up visit with your orthopedist for your knees.  You will be contacted regarding your referral to home health.  Please let us know if you have not been contacted within two weeks.   It was a pleasure to see you today!

## 2021-10-08 NOTE — Assessment & Plan Note (Addendum)
Multiple falls since April 2023. ED and hospital notes reviewed.  Referral placed to home health nursing and physical therapy for evaluation. Her son has arranged for 24-hour care at home.  Agree that she would benefit from [place].

## 2021-10-08 NOTE — Assessment & Plan Note (Signed)
S/P parathyroidectomy on 10/04/2021 per Dr. Maudie Mercury.  Reviewed office notes and surgical notes from care everywhere from August and September 2023. Follow-up as scheduled.

## 2021-10-12 ENCOUNTER — Telehealth: Payer: Self-pay | Admitting: Primary Care

## 2021-10-12 DIAGNOSIS — R296 Repeated falls: Secondary | ICD-10-CM

## 2021-10-12 NOTE — Telephone Encounter (Signed)
Left secured voicemail to Joya Gaskins advising of approval for orders.

## 2021-10-12 NOTE — Telephone Encounter (Signed)
Home Health verbal orders Caller Name: Joya Gaskins Agency Name: [place] number: [phone] (line is secure)  Requesting PT  Frequency: 1x a week for 8 weeks and OT order to assess safety with ADL.   Please forward to [facility] pool or providers CMA

## 2021-10-12 NOTE — Telephone Encounter (Signed)
Approved.  

## 2021-10-12 NOTE — Telephone Encounter (Signed)
Home Health verbal orders Caller Name: Joya Gaskins  Agency Name: [place] number: [phone]  Requesting OT/PT/Skilled nursing/Social Work/Speech: medical social work, [employer] fast streamer (for transfers)   Reason: community resources   Frequency: will go out for assessment then call back with frequency   Please forward to [employer] or providers CMA

## 2021-10-14 ENCOUNTER — Telehealth: Payer: Self-pay | Admitting: Primary Care

## 2021-10-14 NOTE — Telephone Encounter (Signed)
Aaron Edelman from [facility] called stating that they received the referral for [facility] but haven't been able to get into contact with the patient to initiate services. He stated they are working on it and will call back with another update on 10/19/2021. Thank you!

## 2021-10-14 NOTE — Telephone Encounter (Signed)
Called and left secure voicemail with Robinette Haines from [facility]. Informed him that [place], patient son handles things for her and give them his number.

## 2021-10-18 NOTE — Addendum Note (Signed)
Addended by: Pat Kocher on: 10/18/2021 09:52 AM   Modules accepted: Orders

## 2021-10-19 ENCOUNTER — Ambulatory Visit: Payer: Medicare Other | Admitting: Primary Care

## 2021-10-20 ENCOUNTER — Encounter: Payer: Self-pay | Admitting: Primary Care

## 2021-10-20 NOTE — Addendum Note (Signed)
Addended by: Sherrilee Gilles B on: 10/20/2021 01:02 PM   Modules accepted: Orders

## 2021-10-20 NOTE — Telephone Encounter (Signed)
Nevin Bloodgood from [facility] called in and stated that they haven't received an order for the hoyer lift still. The caregiver is also asking if a gate belt could be ordered as well. The order can be sent over to [phone]. Thank you!

## 2021-10-20 NOTE — Telephone Encounter (Signed)
error 

## 2021-10-20 NOTE — Telephone Encounter (Signed)
I have re-faxed the order for the hoyer lift and added the order for a gait belt.

## 2021-10-28 ENCOUNTER — Emergency Department (HOSPITAL_COMMUNITY)
Admission: EM | Admit: 2021-10-28 | Discharge: 2021-10-29 | Disposition: A | Discharge status: Home / Self Care | Payer: Medicare Other | Attending: Emergency Medicine | Admitting: Emergency Medicine

## 2021-10-28 ENCOUNTER — Telehealth: Payer: Self-pay | Admitting: Primary Care

## 2021-10-28 ENCOUNTER — Emergency Department (HOSPITAL_COMMUNITY): Payer: Medicare Other

## 2021-10-28 DIAGNOSIS — R5383 Other fatigue: Secondary | ICD-10-CM | POA: Insufficient documentation

## 2021-10-28 DIAGNOSIS — G8929 Other chronic pain: Secondary | ICD-10-CM | POA: Diagnosis not present

## 2021-10-28 DIAGNOSIS — E892 Postprocedural hypoparathyroidism: Secondary | ICD-10-CM

## 2021-10-28 DIAGNOSIS — N184 Chronic kidney disease, stage 4 (severe): Secondary | ICD-10-CM | POA: Diagnosis not present

## 2021-10-28 DIAGNOSIS — R7401 Elevation of levels of liver transaminase levels: Secondary | ICD-10-CM | POA: Diagnosis not present

## 2021-10-28 DIAGNOSIS — M25561 Pain in right knee: Secondary | ICD-10-CM | POA: Insufficient documentation

## 2021-10-28 DIAGNOSIS — R7989 Other specified abnormal findings of blood chemistry: Secondary | ICD-10-CM | POA: Insufficient documentation

## 2021-10-28 DIAGNOSIS — I129 Hypertensive chronic kidney disease with stage 1 through stage 4 chronic kidney disease, or unspecified chronic kidney disease: Secondary | ICD-10-CM | POA: Diagnosis not present

## 2021-10-28 DIAGNOSIS — R638 Other symptoms and signs concerning food and fluid intake: Secondary | ICD-10-CM

## 2021-10-28 DIAGNOSIS — R630 Anorexia: Secondary | ICD-10-CM | POA: Insufficient documentation

## 2021-10-28 DIAGNOSIS — R296 Repeated falls: Secondary | ICD-10-CM

## 2021-10-28 LAB — URINALYSIS, ROUTINE W REFLEX MICROSCOPIC
Bilirubin Urine: NEGATIVE
Glucose, UA: NEGATIVE mg/dL
Ketones, ur: NEGATIVE mg/dL
Nitrite: NEGATIVE
Protein, ur: NEGATIVE mg/dL
Specific Gravity, Urine: 1.005 (ref 1.005–1.030)
pH: 5 (ref 5.0–8.0)

## 2021-10-28 LAB — CBC WITH DIFFERENTIAL/PLATELET
Abs Immature Granulocytes: 0.06 10*3/uL (ref 0.00–0.07)
Basophils Absolute: 0 10*3/uL (ref 0.0–0.1)
Basophils Relative: 0 %
Eosinophils Absolute: 0.4 10*3/uL (ref 0.0–0.5)
Eosinophils Relative: 5 %
HCT: 29.4 % — ABNORMAL LOW (ref 36.0–46.0)
Hemoglobin: 9.1 g/dL — ABNORMAL LOW (ref 12.0–15.0)
Immature Granulocytes: 1 %
Lymphocytes Relative: 13 %
Lymphs Abs: 0.9 10*3/uL (ref 0.7–4.0)
MCH: 26.8 pg (ref 26.0–34.0)
MCHC: 31 g/dL (ref 30.0–36.0)
MCV: 86.7 fL (ref 80.0–100.0)
Monocytes Absolute: 0.7 10*3/uL (ref 0.1–1.0)
Monocytes Relative: 9 %
Neutro Abs: 5.3 10*3/uL (ref 1.7–7.7)
Neutrophils Relative %: 72 %
Platelets: 326 10*3/uL (ref 150–400)
RBC: 3.39 MIL/uL — ABNORMAL LOW (ref 3.87–5.11)
RDW: 17 % — ABNORMAL HIGH (ref 11.5–15.5)
WBC: 7.3 10*3/uL (ref 4.0–10.5)
nRBC: 0 % (ref 0.0–0.2)

## 2021-10-28 LAB — COMPREHENSIVE METABOLIC PANEL
ALT: 47 U/L — ABNORMAL HIGH (ref 0–44)
AST: 98 U/L — ABNORMAL HIGH (ref 15–41)
Albumin: 2.1 g/dL — ABNORMAL LOW (ref 3.5–5.0)
Alkaline Phosphatase: 314 U/L — ABNORMAL HIGH (ref 38–126)
Anion gap: 10 (ref 5–15)
BUN: 23 mg/dL (ref 8–23)
CO2: 20 mmol/L — ABNORMAL LOW (ref 22–32)
Calcium: 9.5 mg/dL (ref 8.9–10.3)
Chloride: 108 mmol/L (ref 98–111)
Creatinine, Ser: 1.64 mg/dL — ABNORMAL HIGH (ref 0.44–1.00)
GFR, Estimated: 32 mL/min — ABNORMAL LOW (ref >60)
Glucose, Bld: 85 mg/dL (ref 70–99)
Potassium: 3.7 mmol/L (ref 3.5–5.1)
Sodium: 138 mmol/L (ref 135–145)
Total Bilirubin: 2.9 mg/dL — ABNORMAL HIGH (ref 0.3–1.2)
Total Protein: 6 g/dL — ABNORMAL LOW (ref 6.5–8.1)

## 2021-10-28 LAB — MAGNESIUM: Magnesium: 1.8 mg/dL (ref 1.7–2.4)

## 2021-10-28 LAB — I-STAT CHEM 8, ED
BUN: 21 mg/dL (ref 8–23)
Calcium, Ion: 1.19 mmol/L (ref 1.15–1.40)
Chloride: 106 mmol/L (ref 98–111)
Creatinine, Ser: 1.6 mg/dL — ABNORMAL HIGH (ref 0.44–1.00)
Glucose, Bld: 86 mg/dL (ref 70–99)
HCT: 28 % — ABNORMAL LOW (ref 36.0–46.0)
Hemoglobin: 9.5 g/dL — ABNORMAL LOW (ref 12.0–15.0)
Potassium: 3.5 mmol/L (ref 3.5–5.1)
Sodium: 138 mmol/L (ref 135–145)
TCO2: 21 mmol/L — ABNORMAL LOW (ref 22–32)

## 2021-10-28 LAB — CBG MONITORING, ED
Glucose-Capillary: 68 mg/dL — ABNORMAL LOW (ref 70–99)
Glucose-Capillary: 57 mg/dL — ABNORMAL LOW (ref 70–99)
Glucose-Capillary: 86 mg/dL (ref 70–99)

## 2021-10-28 LAB — LACTIC ACID, PLASMA: Lactic Acid, Venous: 1.4 mmol/L (ref 0.5–1.9)

## 2021-10-28 MED ORDER — LACTATED RINGERS IV BOLUS
500.0000 mL | Freq: Once | INTRAVENOUS | Status: AC
  Administered 2021-10-28: 500 mL via INTRAVENOUS

## 2021-10-28 NOTE — Telephone Encounter (Signed)
[name] called in and stated that [name] hasn't voided since 8pm last night. She wasn't sure on what to do. She was informed to call in and let you know. Sent over to access nurse.

## 2021-10-28 NOTE — Telephone Encounter (Signed)
Noted.  I will place referral for palliative care to start. I will have them work on the hospital bed order and also assess to see if she qualifies for hospice.

## 2021-10-28 NOTE — Telephone Encounter (Signed)
Spoke with caregiver, [name]. She is requesting hospice or palliative care because patient is bedridden, hasn't been able to walk for 4 months due to knee pain and [name] is unable to move patient herself. Advised there was certain criteria that had to be met in order to receive hospice or palliative care. [name] states the patient has crippling arthritis as well.   [name] is also requesting a hospital bed with gel pad and referral to nephrology for the patient. Please Advise

## 2021-10-28 NOTE — Telephone Encounter (Signed)
Lavinia Sharps called who is the patient cousin and caregiver wanted Danielle Zamora to refer to hospice or palliative care because she is bed ridden and as soon as possible. Hospital bed with gel pad because she has two broken spots on bed that is getting bad. [name] call back number [phone]. Patient also needs to be referred to a kidney doctor.

## 2021-10-28 NOTE — ED Provider Notes (Incomplete)
[facility] EMERGENCY DEPARTMENT Provider Note   CSN: [id number] Arrival date & time:        History {Add pertinent medical, surgical, social history, OB history to HPI:1} No chief complaint on file.   Danielle Zamora is a 76 y.o. female with CKD stage IV, hyperparathyroidism, gout, HLD, anemia, pernicious anemia, HTN, tobacco abuse, prediabetes, gout, elevated bilirubin, recurrent falls presents with hypotension.   Patient's caregiver is her cousin Danielle Zamora.  Patient called requesting hospice or palliative care because patient is bedridden due to knee pain for 4 months and Danielle Zamora is unable to move the patient herself.  Danielle Zamora called the office back and stated that patient has not voided since 8 PM last night. Danielle Zamora and another cousin are now at bedside for patient. They state that she has had decreased urine output and brought her to make sure her kidneys are okay. Also had fever to 102F on Tuesday but felt okay at that time, did not have any other fevers after that. She was recently admitted and had her parathyroid removed. Endorses pain in her R knee on and off chronically but worsening over the last 1-2 weeks. Had been staying at a rehab facility where she did have a few falls and was evaluated at that time for those. Is cared for by home health and Danielle Zamora. Danielle Zamora called this AM to get referral for palliative care which has been placed.   Patient states that she is here because of her "kidneys" but doesn't know more than that. She denies pain, lightheadedness, recent falls, abdominal pain, CP, SOB, cough, f/c, recent illnesses, dysuria/hematuria. BP taken at bedside is 102/72.   Called patient's son and POA Danielle Zamora who stated that it is his understanding she hasn't been urinating well the last couple of days. Reports that his mother has had some memory trouble and c/f dementia over the last couple of months and so may not be the best historian.   Per chart review, patient  was recently seen in the ED on 09/08/2021 for a fall.  She had negative CT brain and C-spine.  She had a right knee x-ray completed at that time and noted a joint effusion but no fracture.  Patient had a parathyroidectomy on 10/04/2021 for primary hyperparathyroidism.   HPI     Home Medications Prior to Admission medications   Medication Sig Start Date End Date Taking? Authorizing Provider  colchicine 0.6 MG tablet Take 2 tablets by mouth at gout flare onset, take 1 additional tablet 2 hours later.  Then take 1 tablet twice daily until gout flare resolves. Patient not taking: Reported on 10/08/2021 04/21/21   Pleas Koch, NP  gabapentin (NEURONTIN) 100 MG capsule Take 1-3 capsules by mouth up to three times daily for pain. 10/08/21   Pleas Koch, NP  lidocaine (LIDODERM) 5 % Place 1 patch onto the skin daily. Remove & Discard patch within 12 hours or as directed by MD 09/29/21   Pleas Koch, NP  Magnesium 500 MG TABS Take 500 mg by mouth daily.    [provider]  nicotine (NICODERM CQ - DOSED IN MG/24 HOURS) 14 mg/24hr patch Place 1 patch (14 mg total) onto the skin daily. 09/29/21   Pleas Koch, NP  raloxifene (EVISTA) 60 MG tablet Take 60 mg by mouth daily. Patient not taking: Reported on 10/08/2021 01/19/21   [provider]  senna-docusate (SENOKOT-S) 8.6-50 MG tablet Take 1 tablet by mouth at bedtime as needed for  moderate constipation. 05/30/21   Antonieta Pert, MD  traMADol (ULTRAM) 50 MG tablet Take 50 mg by mouth daily as needed for moderate pain. Patient not taking: Reported on 10/08/2021 05/24/21   [provider]      Allergies    Penicillins    Review of Systems   Review of Systems Review of systems negative for CP.  A 10 point review of systems was performed and is negative unless otherwise reported in HPI.  Physical Exam Updated Vital Signs BP 112/75 (BP Location: Left Arm)   Pulse 78   Temp 98.9 F (37.2 C) (Oral)   Resp 14    SpO2 97%  Physical Exam General: Normal appearing elderly female, lying in bed.  HEENT: PERRLA, Sclera anicteric, MMM, trachea midline. Cardiology: RRR, no murmurs/rubs/gallops. BL radial and DP pulses equal bilaterally.  Resp: Normal respiratory rate and effort. CTAB, no wheezes, rhonchi, crackles.  Abd: Soft, non-tender, non-distended. No rebound tenderness or guarding.  GU: Deferred. MSK: No peripheral edema or signs of trauma. Mild effusion of R knee w/ TTP without any erythema, warmth, induration/fluctuance. Intact active and passive ROM though painful. Extremities without deformity. No cyanosis or clubbing. Skin: warm, dry. No rashes or lesions. Neuro: A&Ox4, CNs II-XII grossly intact. MAEs. Sensation grossly intact.  Psych: Normal mood and affect.   ED Results / Procedures / Treatments   Labs (all labs ordered are listed, but only abnormal results are displayed) Labs Reviewed  CBC WITH DIFFERENTIAL/PLATELET - Abnormal; Notable for the following components:      Result Value   RBC 3.39 (*)    Hemoglobin 9.1 (*)    HCT 29.4 (*)    RDW 17.0 (*)    All other components within normal limits  COMPREHENSIVE METABOLIC PANEL - Abnormal; Notable for the following components:   CO2 20 (*)    Creatinine, Ser 1.64 (*)    Total Protein 6.0 (*)    Albumin 2.1 (*)    AST 98 (*)    ALT 47 (*)    Alkaline Phosphatase 314 (*)    Total Bilirubin 2.9 (*)    GFR, Estimated 32 (*)    All other components within normal limits  URINALYSIS, ROUTINE W REFLEX MICROSCOPIC - Abnormal; Notable for the following components:   Color, Urine AMBER (*)    APPearance CLOUDY (*)    Hgb urine dipstick SMALL (*)    Leukocytes,Ua SMALL (*)    Bacteria, UA MANY (*)    All other components within normal limits  I-STAT CHEM 8, ED - Abnormal; Notable for the following components:   Creatinine, Ser 1.60 (*)    TCO2 21 (*)    Hemoglobin 9.5 (*)    HCT 28.0 (*)    All other components within normal limits   CBG MONITORING, ED - Abnormal; Notable for the following components:   Glucose-Capillary 68 (*)    All other components within normal limits  CBG MONITORING, ED - Abnormal; Notable for the following components:   Glucose-Capillary 57 (*)    All other components within normal limits  MAGNESIUM  LACTIC ACID, PLASMA  LACTIC ACID, PLASMA  LIPASE, BLOOD  CBG MONITORING, ED    EKG None  Radiology No results found.  Procedures Procedures  {Document cardiac monitor, telemetry assessment procedure when appropriate:1}  Medications Ordered in ED Medications  lactated ringers bolus 500 mL (500 mLs Intravenous New Bag/Given 10/28/21 2350)    ED Course/ Medical Decision Making/ A&P  Medical Decision Making Amount and/or Complexity of Data Reviewed Labs: ordered. Decision-making details documented in ED Course. Radiology: ordered.    '@HNNMDM'$ @ *** For patient's decreased UOP, consider UTI/cystitis, urinary retention, obstruction, decreased PO intake, AKI. Also consider electrolyte abnormalities,   Very low c/f septic arthritis given that her knee pain has been ongoing, there is no warmth/redness. Patient did have one fever at home which spontaneously resolved and has not had any other fevers. Patient was going to have arthrocentesis as an outpatient, and with no white count and afebrile now, do not believe emergent arthrocentesis is indicated.   I have personally reviewed and interpreted all labs and imaging.   Clinical Course as of 10/28/21 2357  Thu Oct 28, 2021  2055 Glucose-Capillary(!): 57 Patient taking PO now, wil recheck [HN]  2230 Glucose-Capillary: 86 Recheck good [HN]  2355 Lactic Acid, Venous: 1.4 [HN]  2355 Magnesium: 1.8 [HN]  2355 Urinalysis, Routine w reflex microscopic(!) No e/o UTI [HN]  2355 Creatinine(!): 1.64 Elevated from prior 1.0-1.2, but has had elevations in the past to 1.4. Likely mild AKI, treating with 500 cc LR.  [HN]   2356 AST(!): 98 [HN]  2356 ALT(!): 47 [HN]  2356 Total Bilirubin(!): 2.9 Patient has elevation in LFTs. No RUQ pain, abdomen is soft/nontender on exam. Per chart review, was admitted for gallstone pancreatitis in may 2023 but did not have cholecystectomy at that time. BSUS performed by Dr.   [HN]    Clinical Course User Index [HN] Audley Hose, MD    {Document critical care time when appropriate:1} {Document review of labs and clinical decision tools ie heart score, Chads2Vasc2 etc:1}  {Document your independent review of radiology images, and any outside records:1} {Document your discussion with family members, caretakers, and with consultants:1} {Document social determinants of health affecting pt's care:1} {Document your decision making why or why not admission, treatments were needed:1} Final Clinical Impression(s) / ED Diagnoses Final diagnoses:  None    Rx / DC Orders ED Discharge Orders     None        This note was created using dictation software, which may contain spelling or grammatical errors.

## 2021-10-28 NOTE — Telephone Encounter (Signed)
Home Health verbal orders Caller Name:Tiffany Agency Name: [facility] number:   Requesting OT/PT/Skilled nursing/Social Work/Speech: nursing, PT  Reason: Chronic knee pain,med management,disease education  Frequency: once wk for 9 wks Once wk for 8 wks(PT)  Please forward to [facility] pool or providers CMA

## 2021-10-28 NOTE — ED Provider Notes (Signed)
[facility] EMERGENCY DEPARTMENT Provider Note   CSN: [id number] Arrival date & time:        History  No chief complaint on file.   Danielle Zamora is a 76 y.o. female with CKD stage IV, hyperparathyroidism, gout, HLD, anemia, pernicious anemia, HTN, tobacco abuse, prediabetes, gout, elevated bilirubin, recurrent falls presents with hypotension.   Patient's caregiver is her cousin Danielle Zamora.  Patient called requesting hospice or palliative care because patient is bedridden due to knee pain for 4 months and Danielle Zamora is unable to move the patient herself.  Danielle Zamora called the office back and stated that patient has not voided since 8 PM last night. Danielle Zamora and another cousin are now at bedside for patient. They state that she has had decreased urine output and brought her to make sure her kidneys are okay. Also had fever to 102F on Tuesday but felt okay at that time, did not have any other fevers after that. She was recently admitted and had her parathyroid removed. Endorses pain in her R knee on and off chronically but worsening over the last 1-2 weeks. Had been staying at a rehab facility where she did have a few falls and was evaluated at that time for those. Is cared for by home health and Danielle Zamora. Danielle Zamora called this AM to get referral for palliative care which has been placed.   Patient states that she is here because of her "kidneys" but doesn't know more than that. She denies pain, lightheadedness, recent falls, abdominal pain, CP, SOB, cough, f/c, recent illnesses, dysuria/hematuria. BP taken at bedside is 102/72.   Called patient's son and POA Danielle Zamora who stated that it is his understanding she hasn't been urinating well the last couple of days. Reports that his mother has had some memory trouble and c/f dementia over the last couple of months and so may not be the best historian.   Per chart review, patient was recently seen in the ED on 09/08/2021 for a fall.  She had negative  CT brain and C-spine.  She had a right knee x-ray completed at that time and noted a joint effusion but no fracture.  Patient had a parathyroidectomy on 10/04/2021 for primary hyperparathyroidism.   HPI     Home Medications Prior to Admission medications   Medication Sig Start Date End Date Taking? Authorizing Provider  colchicine 0.6 MG tablet Take 2 tablets by mouth at gout flare onset, take 1 additional tablet 2 hours later.  Then take 1 tablet twice daily until gout flare resolves. Patient not taking: Reported on 10/08/2021 04/21/21   Pleas Koch, NP  gabapentin (NEURONTIN) 100 MG capsule Take 1-3 capsules by mouth up to three times daily for pain. 10/08/21   Pleas Koch, NP  lidocaine (LIDODERM) 5 % Place 1 patch onto the skin daily. Remove & Discard patch within 12 hours or as directed by MD 09/29/21   Pleas Koch, NP  Magnesium 500 MG TABS Take 500 mg by mouth daily.    [provider]  nicotine (NICODERM CQ - DOSED IN MG/24 HOURS) 14 mg/24hr patch Place 1 patch (14 mg total) onto the skin daily. 09/29/21   Pleas Koch, NP  raloxifene (EVISTA) 60 MG tablet Take 60 mg by mouth daily. Patient not taking: Reported on 10/08/2021 01/19/21   [provider]  senna-docusate (SENOKOT-S) 8.6-50 MG tablet Take 1 tablet by mouth at bedtime as needed for moderate constipation. 05/30/21   Antonieta Pert, MD  traMADol (ULTRAM) 50 MG tablet Take 50 mg by mouth daily as needed for moderate pain. Patient not taking: Reported on 10/08/2021 05/24/21   [provider]      Allergies    Penicillins    Review of Systems   Review of Systems Review of systems negative for CP.  A 10 point review of systems was performed and is negative unless otherwise reported in HPI.  Physical Exam Updated Vital Signs BP 112/75 (BP Location: Left Arm)   Pulse 78   Temp 98.9 F (37.2 C) (Oral)   Resp 14   SpO2 97%  Physical Exam General: Normal appearing elderly female,  lying in bed.  HEENT: PERRLA, Sclera anicteric, MMM, trachea midline. Cardiology: RRR, no murmurs/rubs/gallops. BL radial and DP pulses equal bilaterally.  Resp: Normal respiratory rate and effort. CTAB, no wheezes, rhonchi, crackles.  Abd: Soft, non-tender, non-distended. No rebound tenderness or guarding.  GU: Deferred. MSK: No peripheral edema or signs of trauma. Mild effusion of R knee w/ TTP without any erythema, warmth, induration/fluctuance. Intact active and passive ROM though painful. Extremities without deformity. No cyanosis or clubbing. Skin: warm, dry. No rashes or lesions. Neuro: A&Ox4, CNs II-XII grossly intact. MAEs. Sensation grossly intact.  Psych: Normal mood and affect.   ED Results / Procedures / Treatments   Labs (all labs ordered are listed, but only abnormal results are displayed) Labs Reviewed  CBC WITH DIFFERENTIAL/PLATELET - Abnormal; Notable for the following components:      Result Value   RBC 3.39 (*)    Hemoglobin 9.1 (*)    HCT 29.4 (*)    RDW 17.0 (*)    All other components within normal limits  COMPREHENSIVE METABOLIC PANEL - Abnormal; Notable for the following components:   CO2 20 (*)    Creatinine, Ser 1.64 (*)    Total Protein 6.0 (*)    Albumin 2.1 (*)    AST 98 (*)    ALT 47 (*)    Alkaline Phosphatase 314 (*)    Total Bilirubin 2.9 (*)    GFR, Estimated 32 (*)    All other components within normal limits  URINALYSIS, ROUTINE W REFLEX MICROSCOPIC - Abnormal; Notable for the following components:   Color, Urine AMBER (*)    APPearance CLOUDY (*)    Hgb urine dipstick SMALL (*)    Leukocytes,Ua SMALL (*)    Bacteria, UA MANY (*)    All other components within normal limits  I-STAT CHEM 8, ED - Abnormal; Notable for the following components:   Creatinine, Ser 1.60 (*)    TCO2 21 (*)    Hemoglobin 9.5 (*)    HCT 28.0 (*)    All other components within normal limits  CBG MONITORING, ED - Abnormal; Notable for the following components:    Glucose-Capillary 68 (*)    All other components within normal limits  CBG MONITORING, ED - Abnormal; Notable for the following components:   Glucose-Capillary 57 (*)    All other components within normal limits  MAGNESIUM  LACTIC ACID, PLASMA  LIPASE, BLOOD  CBG MONITORING, ED    EKG None  Radiology No results found.  Procedures Procedures    Medications Ordered in ED Medications  lactated ringers bolus 500 mL (500 mLs Intravenous New Bag/Given 10/28/21 2350)    ED Course/ Medical Decision Making/ A&P  Medical Decision Making Amount and/or Complexity of Data Reviewed Labs: ordered. Decision-making details documented in ED Course. Radiology: ordered.   Patient is overall HDS, non-toxic appearing.   For patient's decreased UOP, consider UTI/cystitis, urinary retention, obstruction, decreased PO intake, AKI. Also consider electrolyte abnormalities, dehydration. Will evaluate with bladder scan and labs including UA, lactate, CMP.   Very low c/f septic arthritis given that her knee pain has been ongoing, there is no warmth/redness. Patient did have one fever at home which spontaneously resolved and has not had any other fevers. Patient was going to have arthrocentesis as an outpatient, and with no white count and afebrile now, do not believe emergent arthrocentesis is indicated. Will obtain XR of R knee to evaluate for acute fractures.   I have personally reviewed and interpreted all labs and imaging.   Clinical Course as of 10/29/21 0005  Thu Oct 28, 2021  2055 Glucose-Capillary(!): 57 Patient taking PO now, wil recheck [HN]  2230 Glucose-Capillary: 86 Recheck good [HN]  2355 Lactic Acid, Venous: 1.4 [HN]  2355 Magnesium: 1.8 [HN]  2355 Urinalysis, Routine w reflex microscopic(!) No e/o UTI [HN]  2355 Creatinine(!): 1.64 Elevated from prior 1.0-1.2, but has had elevations in the past to 1.4. Likely mild AKI, treating with 500 cc LR.  [HN]   2356 AST(!): 98 [HN]  2356 ALT(!): 47 [HN]  2356 Total Bilirubin(!): 2.9 Patient has elevation in LFTs. No RUQ pain, abdomen is soft/nontender on exam. Per chart review, was admitted for gallstone pancreatitis in may 2023 but did not have cholecystectomy at that time. BSUS performed by Dr.  Leonette Monarch did show gallstones but did not show any overt pericholecystic fluid, GB wall thickening, or CBD dilation. Neg sonographic murphy's sign. Will obtain formal RUQ [place]. [HN]  2358 WBC: 7.3 No leukocytosis [HN]  2358 Hemoglobin(!): 9.1 Down from recent values of 9-11. Reports no dark stools or BRBPR. [HN]  Fri Oct 29, 2021  0005 Patient is signed out to the oncoming ED physician who is made aware of her history, presentation, exam, workup, and plan.  Plan is to repeat LFTs and obtain RUQ [place], dispo pending results. [HN]    Clinical Course User Index [HN] Audley Hose, MD          Final Clinical Impression(s) / ED Diagnoses Final diagnoses:  Other fatigue  Decreased oral intake  Transaminitis  Chronic pain of right knee    Rx / DC Orders ED Discharge Orders          Ordered    traMADol (ULTRAM) 50 MG tablet  Daily PRN        10/29/21 0435    diclofenac Sodium (VOLTAREN ARTHRITIS PAIN) 1 % GEL  4 times daily        10/29/21 0435             This note was created using dictation software, which may contain spelling or grammatical errors.    Audley Hose, MD 11/21/21 [phone]

## 2021-10-28 NOTE — ED Notes (Signed)
EDP made aware of pt's blood sugar, snack given.

## 2021-10-28 NOTE — ED Triage Notes (Signed)
Pt c/o decreased oral intake x2 days, 1 "wet diaper" x2 days. Per EMS, reported hypotension, SBP 90's. Pt given 759m bolus, SBP 98 after, HR 70  20G L FA A&O, CBG 102

## 2021-10-28 NOTE — Telephone Encounter (Signed)
Patients caregiver, [name] was notified of referral placed for palliative care and that they will assist in getting hospital bed.

## 2021-10-29 ENCOUNTER — Emergency Department (HOSPITAL_COMMUNITY): Payer: Medicare Other

## 2021-10-29 DIAGNOSIS — M25561 Pain in right knee: Secondary | ICD-10-CM | POA: Diagnosis not present

## 2021-10-29 LAB — LIPASE, BLOOD: Lipase: 55 U/L — ABNORMAL HIGH (ref 11–51)

## 2021-10-29 MED ORDER — DICLOFENAC SODIUM 1 % EX GEL: 4.0000 g | Freq: Four times a day (QID) | CUTANEOUS | 1 refills | Status: AC

## 2021-10-29 MED ORDER — HYDROCODONE-ACETAMINOPHEN 5-325 MG PO TABS
1.0000 | ORAL_TABLET | Freq: Once | ORAL | Status: AC
  Administered 2021-10-29: 1 via ORAL
  Filled 2021-10-29: qty 1

## 2021-10-29 MED ORDER — TRAMADOL HCL 50 MG PO TABS: 50.0000 mg | ORAL_TABLET | Freq: Every day | ORAL | 0 refills | Status: AC | PRN

## 2021-10-29 NOTE — Telephone Encounter (Signed)
I am just now seeing this message. Patient presented to the ED last night, discharged home. Notes reviewed.  Awaiting urine culture.

## 2021-10-29 NOTE — ED Provider Notes (Signed)
I assumed care of this patient.    Ultrasound negative for acute cholecystitis or evidence of biliary obstruction. Plain film of the right knee obtained and negative for any acute fractures.  The patient appears reasonably screened and/or stabilized for discharge and I doubt any other medical condition or other [facility] requiring further screening, evaluation, or treatment in the ED at this time. I have discussed the findings, Dx and Tx plan with the patient/family who expressed understanding and agree(s) with the plan. Discharge instructions discussed at length. The patient/family was given strict return precautions who verbalized understanding of the instructions. No further questions at time of discharge.  Disposition: Discharge  Condition: Good  ED Discharge Orders          Ordered    traMADol (ULTRAM) 50 MG tablet  Daily PRN        10/29/21 0435    diclofenac Sodium (VOLTAREN ARTHRITIS PAIN) 1 % GEL  4 times daily        10/29/21 [place] narcotic database reviewed and no active prescriptions noted.   Follow Up: Pleas Koch, NP [place] [address] [phone]  Call  to schedule an appointment for close follow up      Fatima Blank, MD 10/29/21 [phone]

## 2021-10-29 NOTE — Telephone Encounter (Signed)
[name] - Client TELEPHONE ADVICE RECORD [facility]urse Patient Name: Danielle Zamora Gender: Female DOB: [birth year] Age: 76 Y 39 M 27 D Return Phone Number: [phone] (Primary) Address: City/ State/ Zip: [place]  [address] Client [facility] - Client Client Site [facility] [place] - Day Contact Type Call Who Is Calling Patient / Member / Family / Caregiver Call Type Triage / Clinical Caller Name Lavinia Sharps Relationship To Patient Care [place] Return Phone Number [phone] (Primary) Chief Complaint Urinary Decreased Output (greater than THREE MONTHS old and no output in 8 hours) Reason for Call Symptomatic / Request for [place] states the pt is in kidney failure. She has not had urine output since 8 pm last night Translation No Nurse Assessment Nurse: Frederic Jericho, RN, Clarise Cruz Date/Time (Eastern Time): 10/28/2021 5:02:35 PM Confirm and document reason for call. If symptomatic, describe symptoms. ---Caller states the patient is in kidney failure and has not had any urine output since 2000 last night. States this is not typical for her. Denies fever or abdominal pain. Does the patient have any new or worsening symptoms? ---Yes Will a triage be completed? ---Yes Related visit to physician within the last 2 weeks? ---No Does the PT have any chronic conditions? (i.e. diabetes, asthma, this includes High risk factors for pregnancy, etc.) ---Yes List chronic conditions. ---kidney failure, arthritis, thyroid disease Is this a behavioral health or substance abuse call? ---No Guidelines Guideline Title Affirmed Question Affirmed Notes Nurse Date/Time (Eastern Time) Urinary Symptoms Shock suspected (e.g., cold/pale/ clammy skin, too weak to stand, low BP, rapid pulse) Frederic Jericho, RN, Clarise Cruz 10/28/2021 5:04:19 PM Disp. Time Eilene Ghazi Time) Disposition Final  User 10/28/2021 5:05:23 PM Call EMS 911 Now Yes Frederic Jericho, RN, Clarise Cruz PLEASE NOTE: All timestamps contained within this report are represented as [place] Standard Time. CONFIDENTIALTY NOTICE: This fax transmission is intended only for the addressee. It contains information that is legally privileged, confidential or otherwise protected from use or disclosure. If you are not the intended recipient, you are strictly prohibited from reviewing, disclosing, copying using or disseminating any of this information or taking any action in reliance on or regarding this information. If you have received this fax in error, please notify us immediately by telephone so that we can arrange for its return to [place]. Phone: [phone], Toll-Free: [phone], Fax: [phone] Page: 2 of 2 Call Id: [id number] [place]. Time Eilene Ghazi Time) Disposition Final User 10/28/2021 5:05:46 PM Send To RN Personal Frederic Jericho, RN, Clarise Cruz 10/28/2021 5:10:21 PM 911 Outcome Documentation Frederic Jericho, RN, Clarise Cruz Reason: Caller states she has not called yet but will call Final Disposition 10/28/2021 5:05:23 PM Call EMS 911 Now Yes Frederic Jericho, RN, Margurite Auerbach Disagree/Comply Comply Caller Understands Yes PreDisposition InappropriateToAsk Care Advice Given Per Guideline CALL EMS 911 NOW: * Immediate medical attention is needed. You need to hang up and call 911 (or an ambulance). CARE ADVICE given per Urinary Symptoms (Adult) guideline. * Triager Discretion: I'll call you back in a few minutes to be sure you were able to reach them

## 2021-10-29 NOTE — Telephone Encounter (Signed)
Spoke with Tiffany and gave verbal orders

## 2021-10-29 NOTE — Telephone Encounter (Signed)
See note below access nurse note.sending note to Gentry Fitz NP.

## 2021-10-29 NOTE — ED Notes (Signed)
[facility] CALLED , NO ETA GIVEN

## 2021-10-29 NOTE — Telephone Encounter (Signed)
Called [facility], Spoke with stephanie who said Danielle Zamora is in meeting she will call back.when available.

## 2021-10-29 NOTE — Discharge Instructions (Addendum)
For pain control you may take at 1000 mg of Tylenol every 8 hours scheduled.  In addition you can take 1 tablet of tramadol daily as needed for pain not controlled with the scheduled Tylenol.  I have also prescribed you both hearing gel that can be applied directly to the knee for additional pain control.

## 2021-10-29 NOTE — Telephone Encounter (Signed)
Patient evaluated in the ED last night.

## 2021-10-29 NOTE — Telephone Encounter (Signed)
Approved.  Home health should be able to help getting a hospital bed.

## 2021-10-30 LAB — URINE CULTURE

## 2021-11-04 ENCOUNTER — Telehealth: Payer: Self-pay | Admitting: Primary Care

## 2021-11-04 NOTE — Telephone Encounter (Signed)
Called and spoke with Danielle Zamora, patients son and informed him that we cannot discuss patients medical information with Stanton Kidney because she is not listed on patients DPR. Danielle Zamora stated Stanton Kidney is now the patients caregiver; I advised patient will need to sign DPR stating we can discuss PHI with [facility]. We are aware that the patient is bedridden, and in light of this Danielle Zamora has given permission for [facility], patients caregiver to come pick up the DPR, take to patient to sign and then bring it back.   Unable to reach [facility] at [phone], left voicemail for her to return call to office.  Need to advise Stanton Kidney she must be on patients DPR (designated party release) in order to discuss patients medical information with her. Per permission from patient son, Danielle Zamora; she will need to come pick up a DPR to take to patient to sign and then return to our office to be put in patients chart.

## 2021-11-04 NOTE — Telephone Encounter (Signed)
Pt family member Stanton Kidney called stated they are providing care for pt and need to go over pt medication have some question concern stated the will hold off on medication until they speak with PCP  . Please advise # [phone]

## 2021-11-04 NOTE — Telephone Encounter (Signed)
Called patient and spoke with her regarding the need to have her caregivers added on her DPR. As we were talking one of her caregivers, Mikle Bosworth came in and patient requested I speak with her and give her this information. I informed Mikle Bosworth, patients cousin and caregiver that in order to discuss PHI with anyone they must be on her DPR. I informed her I recievde message from [facility] this morning and got permission from [place], patient son to pick up DPR. She stated Stanton Kidney is unable to pick up DPR tomorrow and per patients requests Mikle Bosworth will pick up from our office.   Mikle Bosworth will come pick up DPR from our office tomorrow and have patient sign with who she would like [place] to speak to regarding her medical information. I advised she must list all of her caregivers on this form if she is wanting [place] to be able to speak with any of them that call.

## 2021-11-05 ENCOUNTER — Telehealth: Payer: Self-pay | Admitting: Primary Care

## 2021-11-05 NOTE — Telephone Encounter (Signed)
DPR entered into patients chart

## 2021-11-05 NOTE — Telephone Encounter (Signed)
Patient cousin [name] came in office and dropped off [employer] and has been placed in PCP folder.

## 2021-11-09 ENCOUNTER — Telehealth: Payer: Self-pay | Admitting: Primary Care

## 2021-11-09 DIAGNOSIS — R296 Repeated falls: Secondary | ICD-10-CM

## 2021-11-09 DIAGNOSIS — R531 Weakness: Secondary | ICD-10-CM

## 2021-11-09 DIAGNOSIS — E213 Hyperparathyroidism, unspecified: Secondary | ICD-10-CM

## 2021-11-09 DIAGNOSIS — G8929 Other chronic pain: Secondary | ICD-10-CM

## 2021-11-09 DIAGNOSIS — M109 Gout, unspecified: Secondary | ICD-10-CM

## 2021-11-09 NOTE — Telephone Encounter (Addendum)
Spoke with Marlowe Kays from [facility], advised of the approval for speech therapy.  Faxing over medication list to number provided.

## 2021-11-09 NOTE — Telephone Encounter (Signed)
Approved for speech therapy pain  Please fax our medication list which does not contain any blood pressure medication.

## 2021-11-09 NOTE — Telephone Encounter (Signed)
Marlowe Kays from [facility] called in and would like an order for Speech therapy to be sent in for trouble swallowing and family having concerns about dementia. She also stated that the patient was taking 3 blood pressure pills and her blood pressure had got down to 100/50 and she currently hasn't taking any in a week and today her blood pressure was 128/64. They would an updated medication list to be faxed over as the patient medications has changed. It can be faxed over to [phone]. Thank you!

## 2021-11-10 ENCOUNTER — Other Ambulatory Visit: Payer: Self-pay | Admitting: Primary Care

## 2021-11-10 DIAGNOSIS — M25551 Pain in right hip: Secondary | ICD-10-CM

## 2021-11-12 ENCOUNTER — Telehealth: Payer: Self-pay | Admitting: Primary Care

## 2021-11-12 DIAGNOSIS — N184 Chronic kidney disease, stage 4 (severe): Secondary | ICD-10-CM

## 2021-11-12 DIAGNOSIS — R296 Repeated falls: Secondary | ICD-10-CM

## 2021-11-12 DIAGNOSIS — R531 Weakness: Secondary | ICD-10-CM

## 2021-11-12 NOTE — Telephone Encounter (Signed)
Noted.  Please tell [name] that I will place a social work referral for further evaluation and help. Have them call us if they don't hear from someone within 1 week.

## 2021-11-12 NOTE — Telephone Encounter (Signed)
Pt's cousin, [name] called stating the pt's care giver (pt's other cousin) was giving pt different pain meds & is requesting advice on what the pt should actually be taking? [name] named out different meds that aren't on med list, only meds that was on med list that [name] stated the pt takes was "Gabapentin". [name] stated pt's bp was "100/60" last night. [name] just wants advice on what meds pt should & should not be taking? [name] also is asking what other agency could the pt start seeing? Pt is currently seeing "Palliative Care". Call back # [phone]

## 2021-11-12 NOTE — Telephone Encounter (Signed)
Danielle Zamora has been notified of referral placed for social work. She will reach back out to [place] if she hasn't heard anything in a week

## 2021-11-12 NOTE — Telephone Encounter (Signed)
Called [name] and went over medication list in extensive detail. They were giving patient medication that had been discontinued by [facility] (atorvastatin, amlodipine, and irbesartan). [name] wrote down all the information we went over, medication on her list and instructions for taking them.   [name] also wanted to let you know that home health evaluated patient and advised she does not qualify for palliative care. She is requesting if there is any other way to get patient assistance as she is bedridden.

## 2021-11-16 ENCOUNTER — Other Ambulatory Visit: Payer: Self-pay | Admitting: Primary Care

## 2021-11-16 DIAGNOSIS — M109 Gout, unspecified: Secondary | ICD-10-CM

## 2021-11-16 MED ORDER — COLCHICINE 0.6 MG PO TABS: ORAL_TABLET | ORAL | 0 refills | Status: DC

## 2021-11-16 NOTE — Telephone Encounter (Signed)
Called and spoke with Marlowe Kays from [facility].  Patient has atorvastatin 40 mg at home.  Marlowe Kays understands colchicine 0.6 mg is only use as needed for gout flares DME completed and community message has been sent to [facility] staff

## 2021-11-16 NOTE — Telephone Encounter (Signed)
Spoke with Marlowe Kays from [facility] and she had several questions.   Atorvastatin is not currently on patients med list, family has been giving this medication to patient. Marlowe Kays needs verification on whether or not she should be taking atorvastatin.  colchicine 0.6 MG tablet is on patients med list, states we ordered 04/2021. The pharmacy has told family they do not have a record of this medication on patients profile so they cannot fill it. Verified it was sent to the correct pharmacy. She is requesting a new prescription be sent in for this.   Patients family is requesting we order a gel mattress for her. She has the gel pad, but not the gel mattress. She gets her supplies from [facility].

## 2021-11-16 NOTE — Telephone Encounter (Signed)
Noted.  Prescription for atorvastatin 40 mg added to patient's medication list.

## 2021-11-16 NOTE — Telephone Encounter (Signed)
Yes, agree that she should be taking atorvastatin.  What dose is she taking at home?  2.   Okay to continue colchicine 0.6 mg, however this is only a as needed medication for gout flares.  We will submit refill.  3. DME order completed for gel mattress.  Please complete through routine process for DME orders.

## 2021-11-16 NOTE — Telephone Encounter (Signed)
Connie from the pt's hh had questions about the meds, colchicine 0.6 MG tablet. Call back # [phone]

## 2021-11-16 NOTE — Telephone Encounter (Signed)
Danielle Zamora from [facility] called in and had questions regarding her Cholestrol medication not been on the med list. She can reached at [phone] or her caregiver [name] at [phone] Thank you!

## 2021-11-16 NOTE — Addendum Note (Signed)
Addended by: Pleas Koch on: 11/16/2021 04:42 PM   Modules accepted: Orders

## 2021-11-16 NOTE — Addendum Note (Signed)
Addended by: Pleas Koch on: 11/16/2021 05:23 PM   Modules accepted: Orders

## 2021-11-16 NOTE — Telephone Encounter (Signed)
See other phone note for further documentation

## 2021-11-18 ENCOUNTER — Telehealth: Payer: Self-pay | Admitting: Primary Care

## 2021-11-18 ENCOUNTER — Encounter: Payer: Self-pay | Admitting: Primary Care

## 2021-11-18 NOTE — Telephone Encounter (Signed)
Sherlynn Stalls from [facility] called and said patient son declined OT evaluation because the patient is bedridden, Call back [phone].

## 2021-11-19 NOTE — Telephone Encounter (Signed)
Noted  

## 2021-12-07 ENCOUNTER — Telehealth: Payer: Self-pay | Admitting: Primary Care

## 2021-12-07 NOTE — Telephone Encounter (Signed)
Patient called in stating that she has been experiencing diarrhea for about  3 days now,and its not getting better,she would like to know if there is something that can be called in stronger than imodium? I did send her to acces nurse to be triaged.

## 2021-12-07 NOTE — Telephone Encounter (Signed)
Per appt notes pt already has appt on 12/09/21 at 12:20 for VV with Gentry Fitz NP. Sending note to Gentry Fitz NP and [employer].

## 2021-12-07 NOTE — Telephone Encounter (Signed)
[place] [facility] - Client TELEPHONE ADVICE RECORD [facility] Patient Name: Danielle Zamora Gender: Female DOB: 03/21/1945 Age: 76 Y 22 M 6 D Return Phone Number: [phone] (Primary), [phone] (Secondary) Address[place]  [address] Client [facility] - Client Client Site [place] - Day Provider Alma Friendly - NP Contact Type Call Who Is Calling Patient / Member / Family / Caregiver Call Type Triage / Clinical Caller Name [name] - cousin Relationship To Patient Other Return Phone Number [phone] (Primary) Chief Complaint Abdominal Pain Reason for Call Symptomatic / Request for [place] states that she would like to transfer a patient for triage. She states that her mother has been experiencing diarrhea with stomach pain for 2 to 3 days. OTC medication is not helping. Not severe pain. Translation No Nurse Assessment Nurse: Markus Daft, RN, [place] Date/Time (Eastern Time): 12/07/2021 12:49:52 PM Confirm and document reason for call. If symptomatic, describe symptoms. ---Caller, cousin, states that her cousin has been c/o diarrhea for 2 - 3 days. 4 times in last 24 hrs. OTC medication, Kaopectate, is not helping. Denies abdominal pain. Does the patient have any new or worsening symptoms? ---Yes Will a triage be completed? ---Yes Related visit to physician within the last 2 weeks? ---No Does the PT have any chronic conditions? (i.e. diabetes, asthma, this includes High risk factors for pregnancy, etc.) ---Yes List chronic conditions. ---high cholesterol, gout, nerve pain in feet - Gabapentin Is this a behavioral health or substance abuse call? ---No Guidelines Guideline Title Affirmed Question Affirmed Notes Nurse Date/Time (Eastern Time) Diarrhea [1] MODERATE diarrhea (e.g., 4-6 times / day more than normal) AND [2]  age > 39 years Markus Daft, [place], Sherre Poot 12/07/2021 12:54:30 PM PLEASE NOTE: All timestamps contained within this report are represented as [place] Standard Time. CONFIDENTIALTY NOTICE: This fax transmission is intended only for the addressee. It contains information that is legally privileged, confidential or otherwise protected from use or disclosure. If you are not the intended recipient, you are strictly prohibited from reviewing, disclosing, copying using or disseminating any of this information or taking any action in reliance on or regarding this information. If you have received this fax in error, please notify us immediately by telephone so that we can arrange for its return to [place]. Phone: [phone], Toll-Free: [phone], Fax: [phone] Page: 2 of 2 Call Id: [id number] [place]. Time Eilene Ghazi Time) Disposition Final User 12/07/2021 12:59:27 PM See PCP within 24 Hours Yes Markus Daft, RN, [place] Final Disposition 12/07/2021 12:59:27 PM See PCP within 24 Hours Yes Markus Daft, RN, Sherre Poot Caller Disagree/Comply Comply Caller Understands Yes PreDisposition Call Doctor Care Advice Given Per Guideline SEE PCP WITHIN 24 HOURS: * IF OFFICE WILL BE OPEN: You need to be examined within the next 24 hours. Call your doctor (or NP/PA) when the office opens and make an appointment. FLUID THERAPY DURING SEVERE DIARRHEA: * Drink more fluids, at least 8 to 10 cups daily. One cup equals 8 oz (240 ml). * SPORTS DRINKS: You can also drink half-strength sports drinks (e.g., Gatorade, Powerade) to help treat and prevent dehydration. Mix the sports drink half and half with water. * AVOID caffeinated beverages. Reason: Caffeine is mildly dehydrating. * AVOID alcohol beverages (e.g., beer, wine, hard liquor). * AVOID carbonated soft drinks (soda) as these can make your diarrhea worse. * WATER: For mild to moderate diarrhea, water is often the best liquid to drink. You should also  eat some salty foods (e.g., potato chips,  pretzels, saltine crackers). This is important to make sure you are getting enough salt, sugars, and fluids to meet your body's needs. FOOD AND NUTRITION DURING SEVERE DIARRHEA: * As the diarrhea starts to get better, you can slowly return to a normal diet. * Begin with boiled starches / cereals (e.g., potatoes, rice, noodles, wheat, oats) with a small amount of salt. * You can also eat bananas, yogurt, crackers, soup. [place] YOUR HANDS: * Wash your hands after using the bathroom. CALL BACK IF: * Signs of dehydration occur (e.g., no urine over 12 hours, very dry mouth, lightheaded, etc.) * Bloody stools * Constant or severe abdomen pain * You become worse CARE ADVICE given per Diarrhea (Adult) guideline. Comments User: Mayford Knife, RN Date/Time Eilene Ghazi Time): 12/07/2021 12:55:48 PM In nsg home recently d/t falls User: Mayford Knife, RN Date/Time Eilene Ghazi Time): 12/07/2021 1:01:16 PM Caller requesting video visit d/t difficulty transporting since requires a w/c. They want MD to prescribe something stronger than the OTC med. - RN sent caller to office to make appt Referrals REFERRED TO PCP OFFIC

## 2021-12-07 NOTE — Telephone Encounter (Signed)
This is not appropriate for a virtual visit. Please change to in person.

## 2021-12-08 NOTE — Telephone Encounter (Signed)
Spoke with [name], per dpr. I advised that this appointment needs to be in person. She states her caregivers are not able to lift her and they do not have a way to get her in a wheelchair here. Mikle Bosworth will talk with the patients son, and see if he can work something out. She will call back and let us know if they are able to get her in the office.

## 2021-12-08 NOTE — Telephone Encounter (Signed)
Noted  

## 2021-12-08 NOTE — Telephone Encounter (Signed)
Called and spoke with patients son, [name]. He stated that the patient was feeling better and not having many loose bowel movements. Her caretakers canceled the appointment. I advised if her symptoms return and she starts to feel bad again to schedule an appointment for evaluation.

## 2021-12-08 NOTE — Telephone Encounter (Signed)
Unable to reach patient, unable to leave a voicemail.

## 2021-12-09 ENCOUNTER — Telehealth: Payer: Medicare Other | Admitting: Primary Care

## 2021-12-10 ENCOUNTER — Other Ambulatory Visit: Payer: Self-pay

## 2021-12-10 DIAGNOSIS — R197 Diarrhea, unspecified: Secondary | ICD-10-CM | POA: Diagnosis present

## 2021-12-10 DIAGNOSIS — R7401 Elevation of levels of liver transaminase levels: Secondary | ICD-10-CM | POA: Insufficient documentation

## 2021-12-10 DIAGNOSIS — R748 Abnormal levels of other serum enzymes: Secondary | ICD-10-CM | POA: Diagnosis not present

## 2021-12-10 LAB — COMPREHENSIVE METABOLIC PANEL
ALT: 36 U/L (ref 0–44)
AST: 65 U/L — ABNORMAL HIGH (ref 15–41)
Albumin: 3 g/dL — ABNORMAL LOW (ref 3.5–5.0)
Alkaline Phosphatase: 241 U/L — ABNORMAL HIGH (ref 38–126)
Anion gap: 8 (ref 5–15)
BUN: 13 mg/dL (ref 8–23)
CO2: 21 mmol/L — ABNORMAL LOW (ref 22–32)
Calcium: 8.4 mg/dL — ABNORMAL LOW (ref 8.9–10.3)
Chloride: 114 mmol/L — ABNORMAL HIGH (ref 98–111)
Creatinine, Ser: 0.75 mg/dL (ref 0.44–1.00)
GFR, Estimated: >60 mL/min (ref >60)
Glucose, Bld: 126 mg/dL — ABNORMAL HIGH (ref 70–99)
Potassium: 3.7 mmol/L (ref 3.5–5.1)
Sodium: 143 mmol/L (ref 135–145)
Total Bilirubin: 1 mg/dL (ref 0.3–1.2)
Total Protein: 8.2 g/dL — ABNORMAL HIGH (ref 6.5–8.1)

## 2021-12-10 LAB — CBC WITH DIFFERENTIAL/PLATELET
Abs Immature Granulocytes: 0.04 10*3/uL (ref 0.00–0.07)
Basophils Absolute: 0 10*3/uL (ref 0.0–0.1)
Basophils Relative: 0 %
Eosinophils Absolute: 0.2 10*3/uL (ref 0.0–0.5)
Eosinophils Relative: 3 %
HCT: 37.7 % (ref 36.0–46.0)
Hemoglobin: 11.6 g/dL — ABNORMAL LOW (ref 12.0–15.0)
Immature Granulocytes: 1 %
Lymphocytes Relative: 37 %
Lymphs Abs: 2.9 10*3/uL (ref 0.7–4.0)
MCH: 27.8 pg (ref 26.0–34.0)
MCHC: 30.8 g/dL (ref 30.0–36.0)
MCV: 90.2 fL (ref 80.0–100.0)
Monocytes Absolute: 0.7 10*3/uL (ref 0.1–1.0)
Monocytes Relative: 9 %
Neutro Abs: 4 10*3/uL (ref 1.7–7.7)
Neutrophils Relative %: 50 %
Platelets: 368 10*3/uL (ref 150–400)
RBC: 4.18 MIL/uL (ref 3.87–5.11)
RDW: 19 % — ABNORMAL HIGH (ref 11.5–15.5)
WBC: 7.9 10*3/uL (ref 4.0–10.5)
nRBC: 0 % (ref 0.0–0.2)

## 2021-12-10 NOTE — ED Triage Notes (Signed)
Pt c/o diarrhea for a week, denies any pain. Sts her family was concerned so she came to the ER. Pt denies any poor appetite. Pt appears in no obvious distress. Bowel sounds are audible while sitting next to the pt.

## 2021-12-10 NOTE — ED Notes (Signed)
First nurse note:  Diarrhea x 1 wk with dark tarry stools. Denies known hx of GI bleed or ulcers. Denies abd pain.   150/92  HR 110  98% RA  BGL 137 RR 18

## 2021-12-11 ENCOUNTER — Emergency Department
Admission: EM | Admit: 2021-12-11 | Discharge: 2021-12-11 | Disposition: A | Discharge status: Home / Self Care | Payer: Medicare Other | Attending: Emergency Medicine | Admitting: Emergency Medicine

## 2021-12-11 DIAGNOSIS — R197 Diarrhea, unspecified: Secondary | ICD-10-CM

## 2021-12-11 NOTE — ED Provider Notes (Signed)
[facility] Provider Note    Event Date/Time   First MD Initiated Contact with Patient 12/11/21 [phone]     (approximate)   History   Diarrhea   HPI  Danielle Zamora is a 76 y.o. female who presents for evaluation of about a week of diarrhea.  Her family members including her legal guardian are with her and provide care for her at home because she cannot get up and walk around.  They report that she has had 3-4 bowel movements a day that were very loose and when they started to get darker they became concerned and wanted her to be evaluated.  The patient says she feels fine and laughs when her family member mentions that she is going to the bathroom a lot.  She denies any pain.  She has been eating and drinking normally.  No one else in the family has had any diarrhea.  She agrees that she has been having 3-4 bowel movements a day which is more than usual for her.  She has had no nausea nor vomiting.  She feels fine otherwise.  She has not been particularly tired, no lightheadedness nor dizziness, no difficulty taking her medications or eating or drinking.     Physical Exam   Triage Vital Signs: ED Triage Vitals  Enc Vitals Group     BP 12/10/21 2003 (!) 167/95     Pulse Rate 12/10/21 2003 (!) 116     Resp 12/10/21 2003 20     Temp 12/10/21 2003 99.2 F (37.3 C)     Temp Source 12/10/21 2003 Oral     SpO2 12/10/21 2003 93 %     Weight 12/10/21 2002 88.5 kg (195 lb)     Height 12/10/21 2002 1.6 m ('5\' 3"'$ )     Head Circumference --      Peak Flow --      Pain Score 12/10/21 2002 0     Pain Loc --      Pain Edu? --      Excl. in [place]? --     Most recent vital signs: Vitals:   12/10/21 2003 12/11/21 0056  BP: (!) 167/95 (!) 151/88  Pulse: (!) 116 (!) 101  Resp: 20 19  Temp: 99.2 F (37.3 C) 98.9 F (37.2 C)  SpO2: 93% 98%     General: Awake, no distress.  CV:  Good peripheral perfusion.  Initially tachycardic but settled down to less than 100 bpm  during my assessment.  Regular rate and rhythm. Resp:  Normal effort.  Lungs are clear to auscultation. Abd:  No distention.  No tenderness to palpation throughout the abdomen.  No rebound and guarding.  Patient is able to continue talking with me all throughout deep palpation. Other:  Patient is nonambulatory at baseline.   ED Results / Procedures / Treatments   Labs (all labs ordered are listed, but only abnormal results are displayed) Labs Reviewed  CBC WITH DIFFERENTIAL/PLATELET - Abnormal; Notable for the following components:      Result Value   Hemoglobin 11.6 (*)    RDW 19.0 (*)    All other components within normal limits  COMPREHENSIVE METABOLIC PANEL - Abnormal; Notable for the following components:   Chloride 114 (*)    CO2 21 (*)    Glucose, Bld 126 (*)    Calcium 8.4 (*)    Total Protein 8.2 (*)    Albumin 3.0 (*)    AST 65 (*)  Alkaline Phosphatase 241 (*)    All other components within normal limits      PROCEDURES:  Critical Care performed: No  Procedures   MEDICATIONS ORDERED IN ED: Medications - No data to display   IMPRESSION / MDM / [place] / ED COURSE  I reviewed the triage vital signs and the nursing notes.                              Differential diagnosis includes, but is not limited to, infectious diarrhea, metabolic or electrolyte abnormality, diverticulosis or diverticulitis, dietary complications, medication or drug side effect, c. diff.  Patient's presentation is most consistent with acute presentation with potential threat to life or bodily function.  Labs/studies ordered : CMP, CBC with differential.    CBC essentially normal with an almost normal hemoglobin of 11.6, up from prior about a month ago of less than 10.  Although her metabolic panel shows a slight elevation of AST and alkaline phosphatase, overall it is significantly improved from a month ago when she was at [facility]  At that time she had a mild AKI  which she no longer has, and her LFTs were more elevated than currently.  She had an ultrasound at the time which ruled out biliary issues.  Most reassuringly, she has no abdominal pain or tenderness.  Family has been treating with some Kaopectate and Imodium and the patient has not had any pain.  She was initially slightly tachycardic but her heart rate is in the 90s when I was evaluating her and she is in no distress.  She is tolerating oral intake without difficulty.  Given an improvement in all her labs, no pain or tenderness, reassuring and improved vital signs, no difficulty breathing, no fever, no recent antibiotics, etc., I feel that she is appropriate for discharge and outpatient management of loose stools.  I considered doing a rectal exam, but even if she has a positive fecal occult blood test, this tells [place] nothing other than that she has microscopic blood in her stool, but in the absence of any other symptoms and actually improved hemoglobin overall, there is no indication for emergent intervention.  Given the patient's age, initially I was considering whether hospitalization will be necessary, but she looks so good and is so asymptomatic other than a slight increase in frequency and loose stools, she should be appropriate for outpatient management.  I have added reassurance to the patient and her family and they agree with the plan to take her back home.  She will require EMS transportation since she is not ambulatory.  She will follow-up with her primary care doctor.  I gave my usual and customary return precautions.        FINAL CLINICAL IMPRESSION(S) / ED DIAGNOSES   Final diagnoses:  Diarrhea, unspecified type     Rx / DC Orders   ED Discharge Orders     None        Note:  This document was prepared using Dragon voice recognition software and may include unintentional dictation errors.   Hinda Kehr, MD 12/11/21 [phone]

## 2021-12-11 NOTE — Discharge Instructions (Signed)
Your workup in the Emergency Department today was reassuring.  We did not find any specific abnormalities.  We recommend you drink plenty of fluids, take your regular medications and/or any new ones prescribed today, and follow up with the doctor(s) listed in these documents as recommended.  Return to the Emergency Department if you develop new or worsening symptoms that concern you.  

## 2021-12-11 NOTE — ED Notes (Signed)
Called [facility] to transport pt. Home.

## 2021-12-11 NOTE — ED Notes (Signed)
[facility] here to transport pt back home, family to meet pt at home to unlock the door for EMS

## 2021-12-11 NOTE — ED Notes (Signed)
[facility] will send a truck when available to transport pt back home. Family updated

## 2021-12-11 NOTE — ED Notes (Signed)
Per family, pt will need EMS transport to help assist to get her home, pt is unable to ambulate and assist with transfer. Transport being arranged at this time

## 2021-12-13 ENCOUNTER — Telehealth: Payer: Self-pay

## 2021-12-13 NOTE — Telephone Encounter (Signed)
.  Transition Care Management Follow-up Telephone Call Date of discharge and from where:[facility] 12-11-21 Dx: diarrhea  How have you been since you were released from the hospital? Diarrhea is much better  Any questions or concerns? No  Items Reviewed: Did the pt receive and understand the discharge instructions provided? Yes  Medications obtained and verified? Yes  Other? No  Any new allergies since your discharge? No  Dietary orders reviewed? Yes Do you have support at home? Yes   Home Care and Equipment/Supplies: Were home health services ordered? no If so, what is the name of the agency? na  Has the agency set up a time to come to the patient's home? not applicable Were any new equipment or medical supplies ordered?  No What is the name of the medical supply agency? na Were you able to get the supplies/equipment? not applicable Do you have any questions related to the use of the equipment or supplies? No  Functional Questionnaire: (I = Independent and D = Dependent) ADLs: I  Bathing/Dressing- I  Meal Prep- I  Eating- I  Maintaining continence- I  Transferring/Ambulation- I  Managing Meds- I  Follow up appointments reviewed:  PCP Hospital f/u appt confirmed? No  Specialist Hospital f/u appt confirmed? No  . Are transportation arrangements needed? No  If their condition worsens, is the pt aware to call PCP or go to the Emergency Dept.? Yes Was the patient provided with contact information for the PCP's office or ED? Yes Was to pt encouraged to call back with questions or concerns? Yes   Juanda Crumble LPN [place] Direct Dial [phone]

## 2021-12-14 ENCOUNTER — Ambulatory Visit: Payer: Medicare Other | Admitting: Family Medicine

## 2021-12-14 ENCOUNTER — Telehealth: Payer: Self-pay | Admitting: Primary Care

## 2021-12-14 DIAGNOSIS — G8929 Other chronic pain: Secondary | ICD-10-CM

## 2021-12-14 DIAGNOSIS — R296 Repeated falls: Secondary | ICD-10-CM

## 2021-12-14 DIAGNOSIS — R531 Weakness: Secondary | ICD-10-CM

## 2021-12-14 NOTE — Telephone Encounter (Signed)
Patient son Danielle Zamora called in and stated that [name] needs an order placed to [employer] for a transfer board and Low Air Loss (LAL) Mattress size Twin XL. Patient son would like a call when it is placed at [phone]. Please advise. Thank you!

## 2021-12-14 NOTE — Telephone Encounter (Signed)
I just placed the DME order, can you handle it from here?

## 2021-12-14 NOTE — Addendum Note (Signed)
Addended by: Pleas Koch on: 12/14/2021 04:22 PM   Modules accepted: Orders

## 2021-12-15 NOTE — Telephone Encounter (Signed)
Community message sent and notification that the order has been received. Notified [name], patients son that the order has been placed.

## 2022-01-05 ENCOUNTER — Other Ambulatory Visit: Payer: Self-pay | Admitting: Primary Care

## 2022-01-05 DIAGNOSIS — M109 Gout, unspecified: Secondary | ICD-10-CM

## 2022-01-05 NOTE — Telephone Encounter (Signed)
Received refill request for patient's colchicine (gout medicine).  If she experiencing recurrent gout flares?  If so then she should be on a different treatment.

## 2022-01-06 NOTE — Telephone Encounter (Signed)
Unable to reach patient. Left voicemail to return call to our office.   

## 2022-01-06 NOTE — Telephone Encounter (Signed)
Spoke to [place], patients caregiver, per dpr. She stated that patient has a gout flare up at least once a week. Typically the patient takes 2 tablets daily three days a week. She stated the flare ups have not been as bad as they have been in the past. [name] believes the patient takes the medication as much as she does because she is afraid the flare up will get painful.

## 2022-01-07 ENCOUNTER — Other Ambulatory Visit: Payer: Self-pay | Admitting: Primary Care

## 2022-01-07 DIAGNOSIS — M109 Gout, unspecified: Secondary | ICD-10-CM

## 2022-01-07 NOTE — Telephone Encounter (Signed)
If she is experiencing recurrent gout flares and she needs to be treated differently.  She is taking too much colchicine.  If she does not have an active gout flare right now then we can start some medicine to prevent gout attacks.  The medicine is called allopurinol and it is taken once daily.  If she is having a gout attack then the attack must be resolved before we can start the gout prevention medicine.

## 2022-01-11 ENCOUNTER — Other Ambulatory Visit: Payer: Self-pay | Admitting: Primary Care

## 2022-01-11 DIAGNOSIS — M109 Gout, unspecified: Secondary | ICD-10-CM

## 2022-01-11 MED ORDER — ALLOPURINOL 100 MG PO TABS: 100.0000 mg | ORAL_TABLET | Freq: Every day | ORAL | 0 refills | Status: AC

## 2022-01-11 NOTE — Telephone Encounter (Signed)
Noted, Rx for allopurinol sent to pharmacy for gout prevention.

## 2022-01-11 NOTE — Telephone Encounter (Signed)
Spoke with [name], patients caregiver per dpr, she stated the patient is not currently having a gout flare up. She is in agreement that patient needs to start the medication to prevent the flare ups. Advised Anda Kraft would send in allopurinol to pharmacy.

## 2022-01-12 ENCOUNTER — Telehealth: Payer: Self-pay | Admitting: Primary Care

## 2022-01-12 NOTE — Telephone Encounter (Signed)
Mikle Bosworth called stating the meds, allopurinol (ZYLOPRIM) 100 MG tablet [[id number]] was prescribed to pt yesterday, 01/11/22. Mikle Bosworth stated the pt was unaware about the start of the new meds, so they're asking was this something Carlis Abbott wanted pt to start? Call back # [phone]

## 2022-01-12 NOTE — Telephone Encounter (Signed)
Called Danielle Zamora back and informed her that Danielle Zamora, patients other caregiver was notified of the patient being prescribed allopurinol for gout prevention by Anda Kraft. Danielle Zamora was made aware that this medication is to be taken 1 tab by mouth daily for gout prevention.

## 2022-01-13 ENCOUNTER — Telehealth: Payer: Self-pay | Admitting: Primary Care

## 2022-01-13 NOTE — Telephone Encounter (Signed)
Pt caregiver [name] called in requesting a call back regarding when and what medication  pt need's to take . Please Advise # [phone]

## 2022-01-13 NOTE — Telephone Encounter (Signed)
Called [name] back and clarified that she is to take the allopurinol daily for gout prevention. She is only to take the colchicine if she has a gout flare up.

## 2022-02-04 ENCOUNTER — Telehealth: Payer: Self-pay | Admitting: Primary Care

## 2022-02-04 ENCOUNTER — Other Ambulatory Visit: Payer: Self-pay

## 2022-02-04 NOTE — Telephone Encounter (Signed)
Called patients son back and went over everything listed on the patients med list. He made her an appointment to come in for a general checkup.

## 2022-02-04 NOTE — Telephone Encounter (Signed)
Patient son [name] called in and was wanting to get some clarification on the medication Sherley should be taking. Thank you!

## 2022-02-10 ENCOUNTER — Encounter: Payer: Self-pay | Admitting: Primary Care

## 2022-02-10 ENCOUNTER — Ambulatory Visit (INDEPENDENT_AMBULATORY_CARE_PROVIDER_SITE_OTHER): Payer: Medicare Other | Admitting: Primary Care

## 2022-02-10 VITALS — BP 146/98 | HR 93 | Temp 97.4°F | Ht 63.0 in

## 2022-02-10 DIAGNOSIS — E785 Hyperlipidemia, unspecified: Secondary | ICD-10-CM

## 2022-02-10 DIAGNOSIS — D510 Vitamin B12 deficiency anemia due to intrinsic factor deficiency: Secondary | ICD-10-CM | POA: Diagnosis not present

## 2022-02-10 DIAGNOSIS — M25561 Pain in right knee: Secondary | ICD-10-CM

## 2022-02-10 DIAGNOSIS — R7989 Other specified abnormal findings of blood chemistry: Secondary | ICD-10-CM

## 2022-02-10 DIAGNOSIS — R7303 Prediabetes: Secondary | ICD-10-CM

## 2022-02-10 DIAGNOSIS — G8929 Other chronic pain: Secondary | ICD-10-CM

## 2022-02-10 DIAGNOSIS — E213 Hyperparathyroidism, unspecified: Secondary | ICD-10-CM

## 2022-02-10 DIAGNOSIS — N1832 Chronic kidney disease, stage 3b: Secondary | ICD-10-CM

## 2022-02-10 DIAGNOSIS — M109 Gout, unspecified: Secondary | ICD-10-CM

## 2022-02-10 DIAGNOSIS — E559 Vitamin D deficiency, unspecified: Secondary | ICD-10-CM | POA: Diagnosis not present

## 2022-02-10 DIAGNOSIS — I10 Essential (primary) hypertension: Secondary | ICD-10-CM

## 2022-02-10 DIAGNOSIS — D631 Anemia in chronic kidney disease: Secondary | ICD-10-CM

## 2022-02-10 DIAGNOSIS — M25562 Pain in left knee: Secondary | ICD-10-CM

## 2022-02-10 DIAGNOSIS — N184 Chronic kidney disease, stage 4 (severe): Secondary | ICD-10-CM

## 2022-02-10 DIAGNOSIS — R21 Rash and other nonspecific skin eruption: Secondary | ICD-10-CM

## 2022-02-10 DIAGNOSIS — R296 Repeated falls: Secondary | ICD-10-CM

## 2022-02-10 DIAGNOSIS — Z720 Tobacco use: Secondary | ICD-10-CM

## 2022-02-10 DIAGNOSIS — I700 Atherosclerosis of aorta: Secondary | ICD-10-CM

## 2022-02-10 LAB — COMPREHENSIVE METABOLIC PANEL
ALT: 10 U/L (ref 0–35)
AST: 18 U/L (ref 0–37)
Albumin: 3.4 g/dL — ABNORMAL LOW (ref 3.5–5.2)
Alkaline Phosphatase: 165 U/L — ABNORMAL HIGH (ref 39–117)
BUN: 12 mg/dL (ref 6–23)
CO2: 25 mEq/L (ref 19–32)
Calcium: 8.5 mg/dL (ref 8.4–10.5)
Chloride: 108 mEq/L (ref 96–112)
Creatinine, Ser: 0.77 mg/dL (ref 0.40–1.20)
GFR: 74.26 mL/min (ref >60.00)
Glucose, Bld: 118 mg/dL — ABNORMAL HIGH (ref 70–99)
Potassium: 4 mEq/L (ref 3.5–5.1)
Sodium: 142 mEq/L (ref 135–145)
Total Bilirubin: 0.5 mg/dL (ref 0.2–1.2)
Total Protein: 7 g/dL (ref 6.0–8.3)

## 2022-02-10 LAB — VITAMIN D 25 HYDROXY (VIT D DEFICIENCY, FRACTURES): VITD: 30.45 ng/mL (ref 30.00–100.00)

## 2022-02-10 LAB — LIPID PANEL
Cholesterol: 179 mg/dL (ref 0–200)
HDL: 61.8 mg/dL (ref >39.00)
LDL Cholesterol: 91 mg/dL (ref 0–99)
NonHDL: 117.29
Total CHOL/HDL Ratio: 3
Triglycerides: 131 mg/dL (ref 0.0–149.0)
VLDL: 26.2 mg/dL (ref 0.0–40.0)

## 2022-02-10 LAB — VITAMIN B12: Vitamin B-12: 224 pg/mL (ref 211–911)

## 2022-02-10 LAB — URIC ACID: Uric Acid, Serum: 4.5 mg/dL (ref 2.4–7.0)

## 2022-02-10 LAB — HEMOGLOBIN A1C: Hgb A1c MFr Bld: 5.7 % (ref 4.6–6.5)

## 2022-02-10 MED ORDER — GABAPENTIN 300 MG PO CAPS: 300.0000 mg | ORAL_CAPSULE | Freq: Every day | ORAL | 3 refills | Status: AC

## 2022-02-10 MED ORDER — GABAPENTIN 100 MG PO CAPS: 100.0000 mg | ORAL_CAPSULE | Freq: Every day | ORAL | 3 refills | Status: AC

## 2022-02-10 MED ORDER — NYSTATIN 100000 UNIT/GM EX CREA: 1.0000 | TOPICAL_CREAM | Freq: Two times a day (BID) | CUTANEOUS | 0 refills | Status: DC

## 2022-02-10 NOTE — Assessment & Plan Note (Signed)
LDL goal is <70. Repeat lipid panel pending.  Continue atorvastatin 40 mg daily.

## 2022-02-10 NOTE — Assessment & Plan Note (Signed)
Appears fungal/yeast like.  Start nystatin cream twice daily.  If no improvement then consider ketoconazole.

## 2022-02-10 NOTE — Assessment & Plan Note (Signed)
Repeat renal function pending. Reviewed nephrology office notes from June 2023.

## 2022-02-10 NOTE — Addendum Note (Signed)
Addended by: Pleas Koch on: 02/10/2022 11:35 AM   Modules accepted: Orders

## 2022-02-10 NOTE — Progress Notes (Addendum)
Subjective:    Patient ID: Danielle Zamora, female    DOB: 1945/12/29, 77 y.o.   MRN: [id number]  HPI  Danielle Zamora is a very pleasant 77 y.o. female with a history of hypertension, aortic atherosclerosis, hyperparathyroidism, osteoporosis, CKD stage IV, prediabetes, tobacco use, hyperlipidemia, anemia, recurrent falls, chronic knee pain who presents today for follow-up of chronic conditions.  Her caregiver joins [place] today.  1) Hyperparathyroidism: Status post subtotal parathyroidectomy for severe primary hyperparathyroidism in September 2023.  Following with surgical oncology through [facility], last office visit was last week.  Labs during this visit showed normal calcium and normal PTH, however she was hypertensive.  2) CKD Stage 4: Last renal function testing was completed in December 2023 with which revealed creatinine of 0.75 and GFR greater than 60.  She has not followed up with the kidney doctor recently.   3) Chronic Gout: Currently managed on allopurinol 100 mg daily which was initiated 1 month ago for recurrent gout symptoms.  She is also managed on colchicine 0.6 mg as needed.  Typically experiences gout flares to her left great toe. She believes she's noticed a reduction in gout pain since initiation of allopurinol.  She is due for repeat uric acid testing today.  4) Hypertension/Hyperlipidemia/Aortic Atherosclerosis: Currently managed on ibesartan 300 mg daily, atorvastatin 40 mg daily.  Chronically elevated LFTs since May 2023 during hospitalization for cholelithiasis and biliary pancreatitis.  She does not check her BP at home. She resumed her ibesartan six days ago.  She denies chest pain, headaches.  BP Readings from Last 3 Encounters:  02/10/22 (!) 146/98  12/11/21 (!) 141/90  10/29/21 110/63     5) Osteoarthritis/Chronic Knee Pain/Recurrent Falls: Evaluated by orthopedics in November 2023, underwent joint injection to the right knee with cortisone.  She is also managed on  gabapentin 100-300 milligrams up to 3 times daily as needed for pain.   She continues to notice chronic knee pain with decrease in ROM. She spends most of her time during the day in bed. She denies falls over the last few months.   She is needing a new hospital bed mattress. She is requesting a gel mattress as her current mattress has a large dip downward which causes poor posture and back pain.  She also has difficulty getting in and out of bed.   She is taking gabapentin 300 mg HS for sleep.   6) Rash: Chronic and intermittent under breasts and abdominal panus. Her rash burns and is sore. She denies itching. She has routine home care 10 hours daily, 7 days weekly.  She does use nystatin powder which helps somewhat but often cakes up underneath her abdominal folds and breast.  She is needing refills today.  Review of Systems  Respiratory:  Negative for shortness of breath.   Cardiovascular:  Negative for chest pain.  Gastrointestinal:  Negative for constipation and diarrhea.  Musculoskeletal:  Positive for arthralgias and back pain.  Neurological:  Negative for dizziness and headaches.  Psychiatric/Behavioral:  The patient is not nervous/anxious.          Past Medical History:  Diagnosis Date   Cholelithiasis 05/24/2021   Essential hypertension    Hyperparathyroidism due to renal insufficiency ([place]) 06/25/2020   Pernicious anemia     Social History   Socioeconomic History   Marital status: Widowed    Spouse name: Not on file   Number of children: Not on file   Years of education: Not on file   Highest  education level: Not on file  Occupational History   Not on file  Tobacco Use   Smoking status: Every Day    Packs/day: 0.75    Years: 56.00    Total pack years: 42.00    Types: Cigarettes   Smokeless tobacco: Never  Vaping Use   Vaping Use: Never used  Substance and Sexual Activity   Alcohol use: Not Currently    Comment: see note has been at assisted living facility    Drug use: Not Currently    Types: Marijuana    Comment: see note has been at assisted living facility   Sexual activity: Not Currently  Other Topics Concern   Not on file  Social History Narrative   Widow.   3 children, 9 grandchildren   Retired.    Moved from [place].   Enjoys spending time with family.   Social Determinants of Health   Financial Resource Strain: Low Risk  (01/14/2019)   Overall Financial Resource Strain (CARDIA)    Difficulty of Paying Living Expenses: Not hard at all  Food Insecurity: No Food Insecurity (01/14/2019)   Hunger Vital Sign    Worried About Running Out of Food in the Last Year: Never true    Ran Out of Food in the Last Year: Never true  Transportation Needs: No Transportation Needs (01/14/2019)   PRAPARE - Hydrologist (Medical): No    Lack of Transportation (Non-Medical): No  Physical Activity: Inactive (01/14/2019)   Exercise Vital Sign    Days of Exercise per Week: 0 days    Minutes of Exercise per Session: 0 min  Stress: No Stress Concern Present (01/14/2019)   [place]    Feeling of Stress : Only a little  Social Connections: Not on file  Intimate Partner Violence: Not At Risk (01/14/2019)   Humiliation, Afraid, Rape, and Kick questionnaire    Fear of Current or Ex-Partner: No    Emotionally Abused: No    Physically Abused: No    Sexually Abused: No    Past Surgical History:  Procedure Laterality Date   BIOPSY  05/30/2021   Procedure: BIOPSY;  Surgeon: Sharyn Creamer, MD;  Location: [place];  Service: Gastroenterology;;   COLONOSCOPY WITH PROPOFOL N/A 05/30/2021   Procedure: COLONOSCOPY WITH PROPOFOL;  Surgeon: Sharyn Creamer, MD;  Location: [place];  Service: Gastroenterology;  Laterality: N/A;   ESOPHAGOGASTRODUODENOSCOPY (EGD) WITH PROPOFOL N/A 05/30/2021   Procedure: ESOPHAGOGASTRODUODENOSCOPY (EGD) WITH PROPOFOL;  Surgeon: Sharyn Creamer, MD;  Location: [place];  Service: Gastroenterology;  Laterality: N/A;   IMPACTION REMOVAL  05/30/2021   Procedure: IMPACTION REMOVAL;  Surgeon: Sharyn Creamer, MD;  Location: [place];  Service: Gastroenterology;;   PARATHYROIDECTOMY  10/04/2021   POLYPECTOMY  05/30/2021   Procedure: POLYPECTOMY;  Surgeon: Sharyn Creamer, MD;  Location: [facility] ENDOSCOPY;  Service: Gastroenterology;;    Family History  Problem Relation Age of Onset   Kidney disease Mother    Breast cancer Sister     Allergies  Allergen Reactions   Penicillins Swelling    Has patient had a PCN reaction causing immediate rash, facial/tongue/throat swelling, SOB or lightheadedness with hypotension: Yes Has patient had a PCN reaction causing severe rash involving mucus membranes or skin necrosis: Yes Has patient had a PCN reaction that required hospitalization: No Has patient had a PCN reaction occurring within the last 10 years: No If all of the above  answers are "NO", then may proceed with Cephalosporin use.     Current Outpatient Medications on File Prior to Visit  Medication Sig Dispense Refill   allopurinol (ZYLOPRIM) 100 MG tablet Take 1 tablet (100 mg total) by mouth daily. For gout prevention 90 tablet 0   atorvastatin (LIPITOR) 40 MG tablet Take 40 mg by mouth daily. for cholesterol.     colchicine 0.6 MG tablet TAKE TWO TABLETS BY MOUTH AT ONSET OF GOUT FLARE. TAKE 1 ADDITIONAL TABLET TWO HOURS LATER. THEN TAKE 1 TABLET TWICE DAILY UNTIL FLARE RESOLVES 60 tablet 0   irbesartan (AVAPRO) 300 MG tablet Take 300 mg by mouth daily.     lidocaine (LIDODERM) 5 % PLACE 1 PATCH ONTO THE SKIN EVERY DAY. REMOVE AND DISCARD PATCH WITHIN 12 HOURS OR AS DIRECTED BY MD. 30 patch 0   No current facility-administered medications on file prior to visit.    BP (!) 146/98   Pulse 93   Temp (!) 97.4 F (36.3 C) (Temporal)   Ht '5\' 3"'$  (1.6 m) Comment: PATIENT REPORTED  SpO2 94%   BMI 34.54 kg/m  Objective:    Physical Exam Cardiovascular:     Rate and Rhythm: Normal rate and regular rhythm.  Pulmonary:     Effort: Pulmonary effort is normal.     Breath sounds: Normal breath sounds.  Abdominal:     General: Bowel sounds are normal.     Palpations: Abdomen is soft.     Tenderness: There is no abdominal tenderness.  Musculoskeletal:     Cervical back: Neck supple.  Skin:    General: Skin is warm and dry.  Neurological:     Mental Status: She is alert and oriented to person, place, and time.  Psychiatric:        Mood and Affect: Mood normal.           Assessment & Plan:  Gout, unspecified cause, unspecified chronicity, unspecified site Assessment & Plan: Slight improvement with addition of allopurinol.  Continue allopurinol 100 mg daily for now. Consider dose adjustment if needed.   Checking uric acid and renal function today. Continue colchicine 0.6 mg PRN  Orders: -     Uric acid  Chronic pain of both knees Assessment & Plan: Reviewed orthopedists notes from November 2023 through care everywhere.  Add gabapentin 100 mg during the day. Continue gabapentin 300 mg HS.  Advised she increase physical activity  Orders: -     Gabapentin; Take 1 capsule (300 mg total) by mouth at bedtime. For pain and sleep  Dispense: 90 capsule; Refill: 3 -     Gabapentin; Take 1 capsule (100 mg total) by mouth daily. For pain  Dispense: 90 capsule; Refill: 3 -     For home use only DME Other see comment  Hyperlipidemia, unspecified hyperlipidemia type Assessment & Plan: LDL goal less than 70.  Continue atorvastatin 40 mg daily. Repeat lipid panel pending.  Orders: -     Lipid panel  Prediabetes Assessment & Plan: Repeat A1c pending.  Orders: -     Hemoglobin A1c  Pernicious anemia -     Vitamin B12  Vitamin D deficiency -     VITAMIN D 25 Hydroxy (Vit-D Deficiency, Fractures)  Rash and nonspecific skin eruption Assessment & Plan: Appears fungal/yeast like.  Start  nystatin cream twice daily.  If no improvement then consider ketoconazole.  Orders: -     Nystatin; Apply 1 Application topically 2 (two) times daily.  Dispense:  60 g; Refill: 0  CKD (chronic kidney disease) stage 4, GFR 15-29 ml/min (HCC) Assessment & Plan: Repeat renal function pending. Reviewed nephrology office notes from June 2023.   Orders: -     Comprehensive metabolic panel  Aortic atherosclerosis ([place]) Assessment & Plan: LDL goal is <70. Repeat lipid panel pending.  Continue atorvastatin 40 mg daily.   Essential hypertension Assessment & Plan: Improving but still above goal.  Continue irbesartan 300 mg daily. Consider addition of amlodipine if warranted.   She will send BP readings via MyChart in 2 weeks. CMP pending.   Hyperparathyroidism [facility]) Assessment & Plan: Controlled. Reviewed surgeon's notes and labs from Care Everywhere from January 2024.     Anemia due to stage 3b chronic kidney disease ([place]) Assessment & Plan: Improved. Reviewed CBC from December 2023   Elevated LFTs Assessment & Plan: Repeat liver enzymes pending.   Recurrent falls Assessment & Plan: No recent falls.  Agree that she needs a gel mattress for posture/positioning and prevention of falls while getting in and out of bed. Orders placed for gel mattress.  Orders: -     For home use only DME Other see comment  Tobacco abuse Assessment & Plan: Due for repeat lung cancer screening in April 2024.  Discussed this with patient today.         Pleas Koch, NP

## 2022-02-10 NOTE — Assessment & Plan Note (Signed)
Slight improvement with addition of allopurinol.  Continue allopurinol 100 mg daily for now. Consider dose adjustment if needed.   Checking uric acid and renal function today. Continue colchicine 0.6 mg PRN

## 2022-02-10 NOTE — Assessment & Plan Note (Signed)
Due for repeat lung cancer screening in April 2024.  Discussed this with patient today.

## 2022-02-10 NOTE — Assessment & Plan Note (Signed)
Improving but still above goal.  Continue irbesartan 300 mg daily. Consider addition of amlodipine if warranted.   She will send BP readings via MyChart in 2 weeks. CMP pending.

## 2022-02-10 NOTE — Assessment & Plan Note (Signed)
Repeat A1c pending. 

## 2022-02-10 NOTE — Patient Instructions (Addendum)
Stop by the lab prior to leaving today. I will notify you of your results once received.   You can take gabapentin 100 mg once during the day for pain.  You can take gabapentin 300 mg at bedtime for sleep and pain.  Follow up Dr. Lenell Antu at [facility].  Use the Nystatin cream twice daily as needed.  Danielle Zamora will call you regarding the lung cancer screening test.   It was a pleasure to see you today!

## 2022-02-10 NOTE — Assessment & Plan Note (Signed)
Controlled. Reviewed surgeon's notes and labs from Care Everywhere from January 2024.

## 2022-02-10 NOTE — Assessment & Plan Note (Signed)
Reviewed orthopedists notes from November 2023 through care everywhere.  Add gabapentin 100 mg during the day. Continue gabapentin 300 mg HS.  Advised she increase physical activity

## 2022-02-10 NOTE — Assessment & Plan Note (Signed)
LDL goal less than 70.  Continue atorvastatin 40 mg daily. Repeat lipid panel pending.

## 2022-02-10 NOTE — Assessment & Plan Note (Signed)
Improved. Reviewed CBC from December 2023

## 2022-02-10 NOTE — Assessment & Plan Note (Signed)
No recent falls.  Agree that she needs a gel mattress for posture/positioning and prevention of falls while getting in and out of bed. Orders placed for gel mattress.

## 2022-02-10 NOTE — Assessment & Plan Note (Signed)
Repeat liver enzymes pending. 

## 2022-02-11 ENCOUNTER — Telehealth: Payer: Self-pay | Admitting: Primary Care

## 2022-02-11 NOTE — Addendum Note (Signed)
Addended by: Pat Kocher on: 02/11/2022 09:54 AM   Modules accepted: Orders

## 2022-02-11 NOTE — Telephone Encounter (Signed)
Updated order, and sent community message updating [facility] team.

## 2022-02-11 NOTE — Telephone Encounter (Signed)
Patient called and stated patient needs a low air loss mattress. Call back number [phone].

## 2022-02-11 NOTE — Telephone Encounter (Signed)
Spoke with [facility] team and they stated the patient does not qualify for the low air loss mattress. Qualifications that would have to be met are listed below:   -Name of the home health agency, wound care center or other clinical entity providing care for the patient, Their phone number and start care date.  -clinical note from office explaining why patient needs a support surface and includes wound measurements (Length, width, depth and location of wound(s)).Documentation stating multiple stage 2 pressure ulcers on the truck or pelvis and patient has been on and tried group one support surface at least one month.  -OR Documentation stating stage 3 or 4 pressure ulcer on trunk or pelvis  -OR recent flap or skin graft for an ulcer on the trunk or pelvis.    Called and notified patients son, he is understandably frustrated but appreciates all involved trying to help. Advised he could look into the OTP cost of one. Nothing further needed at this time.

## 2022-02-14 ENCOUNTER — Telehealth: Payer: Self-pay | Admitting: Primary Care

## 2022-02-14 DIAGNOSIS — E2839 Other primary ovarian failure: Secondary | ICD-10-CM

## 2022-02-14 NOTE — Telephone Encounter (Signed)
Called patients son and reviewed all information. He verbalized understanding. He will call and schedule appointment for the bone density scan.

## 2022-02-14 NOTE — Telephone Encounter (Signed)
Patients son called in and asked about his mom being started on medication for osteoporosis? She was seen in the office on 02/10/22,and talked a little about it with Kate,but they subject was changed and they forgot to discuss.

## 2022-02-14 NOTE — Telephone Encounter (Signed)
Let patient's son know that I searched through her chart and it looks like she was receiving bone density medication from her prior endocrinologist, last injection may have been in late 2021 or early 2022.  She really needs a repeat bone density scan for evaluation before we can prescribe. I will order this for [facility] in [place]. She (or someone) will have to call to schedule the appointment.

## 2022-02-17 ENCOUNTER — Telehealth: Payer: Self-pay

## 2022-02-17 NOTE — Telephone Encounter (Signed)
Called to schedule patient for April LDCT 2024.  Phone rang multiple times but no answer and no VM picked up.  Letter mailed to home to call for scheduling.

## 2022-02-22 ENCOUNTER — Encounter: Payer: Self-pay | Admitting: Primary Care

## 2022-02-22 ENCOUNTER — Telehealth: Payer: Self-pay | Admitting: Primary Care

## 2022-02-22 DIAGNOSIS — M62838 Other muscle spasm: Secondary | ICD-10-CM

## 2022-02-22 MED ORDER — TIZANIDINE HCL 4 MG PO TABS: 4.0000 mg | ORAL_TABLET | Freq: Two times a day (BID) | ORAL | 0 refills | Status: DC | PRN

## 2022-02-22 NOTE — Telephone Encounter (Signed)
error 

## 2022-02-22 NOTE — Telephone Encounter (Signed)
Patient son called in stating that his mom is having spasms in both of  her legs and the right on will not go all the way down. She would like to know if she can have an rx for flexorell or if she needs to come in to be seen again for this issue? She was just seen on 02/10/2021 and forgot to mention the spasms that she's been having . He would also know which location was the referral for her bone density test sent out too?

## 2022-02-22 NOTE — Telephone Encounter (Signed)
Please call patient's son:  Yes, I will send in some Tizanidine muscle relaxer medication for now. This can cause drowsiness so be careful. This is only to be used as needed.   The spasms could be secondary to the atorvastatin cholesterol pill. It can occur at any point while taking the medication. Have her stop the atorvastatin for 2 weeks and calls [place] back with an update on the spasms.   Bone density order was sent to [facility]. They have to call to schedule

## 2022-02-22 NOTE — Telephone Encounter (Signed)
Called patient and reviewed all information. Patient verbalized understanding. Will call if any further questions.  They will update [place] in 2 weeks regarding the spasms

## 2022-03-01 ENCOUNTER — Other Ambulatory Visit: Payer: Self-pay | Admitting: Primary Care

## 2022-03-01 ENCOUNTER — Telehealth: Payer: Self-pay | Admitting: Primary Care

## 2022-03-01 DIAGNOSIS — M62838 Other muscle spasm: Secondary | ICD-10-CM

## 2022-03-01 NOTE — Telephone Encounter (Signed)
Prescription Request  03/01/2022  Is this a "Controlled Substance" medicine? No  LOV: 02/10/2022  What is the name of the medication or equipment? tiZANidine (ZANAFLEX) 4 MG tablet   Have you contacted your pharmacy to request a refill? No   Which pharmacy would you like this sent to?  [id number] - [place], [place] - [address] [place] [address] Phone: [phone] Fax: [phone]    Patient notified that their request is being sent to the clinical staff for review and that they should receive a response within 2 business days.   Please advise at Mobile [phone] (mobile)

## 2022-03-01 NOTE — Telephone Encounter (Signed)
Please call patient:  Received refill request for muscle relaxer Tizanidine.  Did she stop taking the atorvastatin cholesterol pill as instructed during her last visit? This medication has a side effect of muscle cramps and spasms.

## 2022-03-01 NOTE — Telephone Encounter (Signed)
See refill request.

## 2022-03-01 NOTE — Telephone Encounter (Signed)
Called pt, phone constantly rung, didn't get vm to leave message.

## 2022-03-02 MED ORDER — TIZANIDINE HCL 4 MG PO TABS: 4.0000 mg | ORAL_TABLET | Freq: Two times a day (BID) | ORAL | 0 refills | Status: DC | PRN

## 2022-03-02 NOTE — Telephone Encounter (Signed)
Called and spoke with patients son, he states she takes the tizanidine twice daily every day. It does help with her muscle spasms. Patients son states there hasn't been any improvement since stopping the atorvastatin, he will have her resume it.

## 2022-03-02 NOTE — Addendum Note (Signed)
Addended by: Pleas Koch on: 03/02/2022 12:58 PM   Modules accepted: Orders

## 2022-03-02 NOTE — Telephone Encounter (Signed)
Danielle Zamora called returning Danielle Zamora's missed call. Told Danielle Zamora, Danielle Zamora's response, Danielle Zamora stated the pt did stop Danielle Zamora but the pt is still having spasms. Call back # [phone]

## 2022-03-02 NOTE — Telephone Encounter (Signed)
Noted.   How often is she taking the tizanidine muscle relaxer? Once daily? A few times weekly?  Is this helping?  Also, if no improvement off atorvastatin then have her resume.

## 2022-03-02 NOTE — Telephone Encounter (Signed)
Left voicemail with [name] to return call to office.

## 2022-03-02 NOTE — Telephone Encounter (Addendum)
The tizanidine should not be taken routinely... only if needed. I'll refill the Tizanidine but for twice daily AS NEEDED. This medication can cause drowsiness which can cause falls.

## 2022-03-02 NOTE — Telephone Encounter (Signed)
Spoke with patients son, he was starting to tell me that they are using it as needed and aware this is how its intended on being taken but she is having the spams so often she needs it everyday. The patient then told her son, while on the phone with me that the tizanidine was not helping at all. He seemed confused, he was under the impression when he spoke with the patient this morning that the medication was helping. She stated to her son she does not feel any improvement at all. The spasms are occurring in both of her legs now. Her son asked what she is requesting since she was adiment the tizanidine was not working. She stated she feels she needs something stronger to help slow the spasms down. Please advise

## 2022-03-03 NOTE — Telephone Encounter (Signed)
I do not understand the etiology of her muscle spasms.  I want her to stop the atorvastatin cholesterol medicine again for a total of 4 weeks to see if this helps.  Muscle spasms are common side effect from atorvastatin.  Have them update [place].  I do not have anything stronger for muscle relaxation.  It would help if she could ambulate some during the day to prevent stiffness and spasms.

## 2022-03-04 NOTE — Telephone Encounter (Signed)
Patient's son returning call Rudy,[name] (Son) [phone] [facility] Phone)

## 2022-03-04 NOTE — Telephone Encounter (Signed)
Left message on voicemail for patient to call the office back. Patient's son is not on the current DPR.

## 2022-03-04 NOTE — Telephone Encounter (Signed)
Spoke to son on dpr. Reviewed all information with him. He has agreed to hold atorvastatin. As requested by son made follow up in office on 03/14/22 to see if any other treatment is needed. Will call If any new or increased symptoms to get seen sooner.

## 2022-03-09 ENCOUNTER — Other Ambulatory Visit: Payer: Self-pay | Admitting: Primary Care

## 2022-03-09 DIAGNOSIS — M25551 Pain in right hip: Secondary | ICD-10-CM

## 2022-03-14 ENCOUNTER — Ambulatory Visit (INDEPENDENT_AMBULATORY_CARE_PROVIDER_SITE_OTHER): Payer: Medicare Other | Admitting: Primary Care

## 2022-03-14 ENCOUNTER — Telehealth: Payer: Self-pay

## 2022-03-14 ENCOUNTER — Encounter: Payer: Self-pay | Admitting: Primary Care

## 2022-03-14 VITALS — BP 124/76 | HR 88 | Temp 98.1°F

## 2022-03-14 DIAGNOSIS — M62838 Other muscle spasm: Secondary | ICD-10-CM

## 2022-03-14 DIAGNOSIS — M25562 Pain in left knee: Secondary | ICD-10-CM

## 2022-03-14 DIAGNOSIS — R296 Repeated falls: Secondary | ICD-10-CM | POA: Diagnosis not present

## 2022-03-14 DIAGNOSIS — M25561 Pain in right knee: Secondary | ICD-10-CM

## 2022-03-14 DIAGNOSIS — G8929 Other chronic pain: Secondary | ICD-10-CM

## 2022-03-14 MED ORDER — METHOCARBAMOL 500 MG PO TABS: 500.0000 mg | ORAL_TABLET | Freq: Three times a day (TID) | ORAL | 0 refills | Status: AC | PRN

## 2022-03-14 NOTE — Progress Notes (Signed)
Subjective:    Patient ID: Danielle Zamora, female    DOB: 1945-12-12, 77 y.o.   MRN: [id number]  Leg Pain     Danielle Zamora is a very pleasant 77 y.o. female with a history of hypertension, hyperparathyroidism s/p parathyroidectomy, CKD, gout, hyperlipidemia, chronic knee pain, recurrent falls who presents today to discuss lower extremity pain.   Her home health aid joins [place] today.  Her pain is located to the right lower extremity which is constant. Her right lower extremity will "draw up" from the knee  and her entire leg will spasm. This happens multiple times daily. 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 Following with orthopedics and has undergone multiple injections. Is due for follow up.   She is managed on Tizanidine 4 mg which hasn't helped. She's taken Tylenol with some improvement.   She is also requesting a sit to stand up lift to assist with range of motion and mobility as she cannot get out of bed by herself. She is also needing a right leg brace to help straighten her leg during the day.   She has not taken her atorvastatin in several weeks and hasn't noticed a difference in her cramping and spasms.    Review of Systems  Musculoskeletal:  Positive for arthralgias and myalgias.  Neurological:  Positive for weakness.         Past Medical History:  Diagnosis Date   Cholelithiasis 05/24/2021   Essential hypertension    Hyperparathyroidism due to renal insufficiency ([place]) 06/25/2020   Pernicious anemia     Social History   Socioeconomic History   Marital status: Widowed    Spouse name: Not on file   Number of children: Not on file   Years of education: Not on file   Highest education level: Not on file  Occupational History   Not on file  Tobacco Use   Smoking status: Every Day    Packs/day: 0.75    Years: 56.00    Total pack years: 42.00    Types: Cigarettes   Smokeless tobacco: Never  Vaping Use   Vaping Use: Never used   Substance and Sexual Activity   Alcohol use: Not Currently    Comment: see note has been at assisted living facility   Drug use: Not Currently    Types: Marijuana    Comment: see note has been at assisted living facility   Sexual activity: Not Currently  Other Topics Concern   Not on file  Social History Narrative   Widow.   3 children, 9 grandchildren   Retired.    Moved from [place].   Enjoys spending time with family.   Social Determinants of Health   Financial Resource Strain: Low Risk  (01/14/2019)   Overall Financial Resource Strain (CARDIA)    Difficulty of Paying Living Expenses: Not hard at all  Food Insecurity: No Food Insecurity (01/14/2019)   Hunger Vital Sign    Worried About Running Out of Food in the Last Year: Never true    Ran Out of Food in the Last Year: Never true  Transportation Needs: No Transportation Needs (01/14/2019)   PRAPARE - Hydrologist (Medical): No    Lack of Transportation (Non-Medical): No  Physical Activity: Inactive (01/14/2019)   Exercise Vital Sign    Days of Exercise per Week: 0 days    Minutes of Exercise per Session: 0 min  Stress: No Stress Concern Present (01/14/2019)  Isabela Questionnaire    Feeling of Stress : Only a little  Social Connections: Not on file  Intimate Partner Violence: Not At Risk (01/14/2019)   Humiliation, Afraid, Rape, and Kick questionnaire    Fear of Current or Ex-Partner: No    Emotionally Abused: No    Physically Abused: No    Sexually Abused: No    Past Surgical History:  Procedure Laterality Date   BIOPSY  05/30/2021   Procedure: BIOPSY;  Surgeon: Sharyn Creamer, MD;  Location: [place];  Service: Gastroenterology;;   COLONOSCOPY WITH PROPOFOL N/A 05/30/2021   Procedure: COLONOSCOPY WITH PROPOFOL;  Surgeon: Sharyn Creamer, MD;  Location: [place];  Service: Gastroenterology;  Laterality: N/A;    ESOPHAGOGASTRODUODENOSCOPY (EGD) WITH PROPOFOL N/A 05/30/2021   Procedure: ESOPHAGOGASTRODUODENOSCOPY (EGD) WITH PROPOFOL;  Surgeon: Sharyn Creamer, MD;  Location: [place];  Service: Gastroenterology;  Laterality: N/A;   IMPACTION REMOVAL  05/30/2021   Procedure: IMPACTION REMOVAL;  Surgeon: Sharyn Creamer, MD;  Location: [place];  Service: Gastroenterology;;   PARATHYROIDECTOMY  10/04/2021   POLYPECTOMY  05/30/2021   Procedure: POLYPECTOMY;  Surgeon: Sharyn Creamer, MD;  Location[facility];  Service: Gastroenterology;;    Family History  Problem Relation Age of Onset   Kidney disease Mother    Breast cancer Sister     Allergies  Allergen Reactions   Penicillins Swelling    Has patient had a PCN reaction causing immediate rash, facial/tongue/throat swelling, SOB or lightheadedness with hypotension: Yes Has patient had a PCN reaction causing severe rash involving mucus membranes or skin necrosis: Yes Has patient had a PCN reaction that required hospitalization: No Has patient had a PCN reaction occurring within the last 10 years: No If all of the above answers are "NO", then may proceed with Cephalosporin use.     Current Outpatient Medications on File Prior to Visit  Medication Sig Dispense Refill   allopurinol (ZYLOPRIM) 100 MG tablet Take 1 tablet (100 mg total) by mouth daily. For gout prevention 90 tablet 0   gabapentin (NEURONTIN) 100 MG capsule Take 1 capsule (100 mg total) by mouth daily. For pain 90 capsule 3   gabapentin (NEURONTIN) 300 MG capsule Take 1 capsule (300 mg total) by mouth at bedtime. For pain and sleep 90 capsule 3   irbesartan (AVAPRO) 300 MG tablet Take 300 mg by mouth daily.     lidocaine (LIDODERM) 5 % UNWRAP AND APPLY 1 PATCH TO EVERY DAY. REMOVE AND DISCARD PATCH WITHIN 12 HOURS OR AS DIRECTED BY MD 30 patch 0   nystatin cream (MYCOSTATIN) Apply 1 Application topically 2 (two) times daily. 60 g 0   atorvastatin (LIPITOR) 40 MG tablet Take  40 mg by mouth daily. for cholesterol. (Patient not taking: Reported on 03/14/2022)     colchicine 0.6 MG tablet TAKE TWO TABLETS BY MOUTH AT ONSET OF GOUT FLARE. TAKE 1 ADDITIONAL TABLET TWO HOURS LATER. THEN TAKE 1 TABLET TWICE DAILY UNTIL FLARE RESOLVES (Patient not taking: Reported on 03/14/2022) 60 tablet 0   No current facility-administered medications on file prior to visit.    BP 124/76   Pulse 88   Temp 98.1 F (36.7 C) (Temporal)   SpO2 97%  Objective:   Physical Exam Cardiovascular:     Rate and Rhythm: Normal rate and regular rhythm.  Pulmonary:     Effort: Pulmonary effort is normal.     Breath sounds: Normal breath sounds.  Musculoskeletal:     Cervical back: Neck supple.     Comments: Right lower extremity appropriately flexed in sitting position while in wheelchair.   Skin:    General: Skin is warm and dry.           Assessment & Plan:  Muscle spasm -     Methocarbamol; Take 1 tablet (500 mg total) by mouth every 8 (eight) hours as needed for muscle spasms.  Dispense: 90 tablet; Refill: 0  Chronic pain of both knees Assessment & Plan: Referral placed back to orthopedics for further evaluation.  DME orders placed for sit to stand up lift and for right lower extremity brace.  Start methocarbamol 500 mg every 8 hours as needed, drowsiness precautions provided. Continue Tylenol.  Orders: -     Ambulatory referral to Orthopedic Surgery -     Methocarbamol; Take 1 tablet (500 mg total) by mouth every 8 (eight) hours as needed for muscle spasms.  Dispense: 90 tablet; Refill: 0 -     For home use only DME Other see comment  Recurrent falls Assessment & Plan: Agree that she qualifies for a sit to stand up lift. DME orders provided.  Orders: -     For home use only DME Other see comment        Pleas Koch, NP

## 2022-03-14 NOTE — Patient Instructions (Signed)
Start methocarbamol 500 mg tablets.  Take 1 tablet by mouth every 8 hours as needed for muscle spasms and leg pain.  We will submit the request for the stand to sit left and for the leg brace.  Please call me in 1 week if you do not hear from anyone regarding this.  Consider Metamucil to help with stool bulk.   It was a pleasure to see you today!

## 2022-03-14 NOTE — Assessment & Plan Note (Signed)
Referral placed back to orthopedics for further evaluation.  DME orders placed for sit to stand up lift and for right lower extremity brace.  Start methocarbamol 500 mg every 8 hours as needed, drowsiness precautions provided. Continue Tylenol.

## 2022-03-14 NOTE — Assessment & Plan Note (Signed)
Agree that she qualifies for a sit to stand up lift. DME orders provided.

## 2022-03-14 NOTE — Telephone Encounter (Signed)
DME order for Stand to sit lift placed today.  [place] [employer] team notified us that they are only providing hospice patient with the lift right now.   Notified patients son that this order was not approved.

## 2022-03-15 ENCOUNTER — Encounter: Payer: Self-pay | Admitting: *Deleted

## 2022-03-16 ENCOUNTER — Other Ambulatory Visit: Payer: Self-pay | Admitting: Primary Care

## 2022-03-16 DIAGNOSIS — R21 Rash and other nonspecific skin eruption: Secondary | ICD-10-CM

## 2022-03-18 ENCOUNTER — Telehealth: Payer: Self-pay | Admitting: Primary Care

## 2022-03-18 DIAGNOSIS — R296 Repeated falls: Secondary | ICD-10-CM

## 2022-03-18 DIAGNOSIS — G8929 Other chronic pain: Secondary | ICD-10-CM

## 2022-03-18 DIAGNOSIS — R32 Unspecified urinary incontinence: Secondary | ICD-10-CM

## 2022-03-18 NOTE — Telephone Encounter (Signed)
Called and spoke to patients son, DME order needs to be placed for the following:  Extra large orange chucks 2XL Briefs  ProCare large adult wash cloths Incontinence Spray  Patients son aware Anda [employer] is out of office and there will be delay in response time.  Started DME order, pended

## 2022-03-18 NOTE — Telephone Encounter (Signed)
Patient son called in and was wanting a script for incontinence supplies. He can be reached at [phone], he would like a call when this is done. Please advise. Thank you!

## 2022-03-18 NOTE — Addendum Note (Signed)
Addended by: Pat Kocher on: 03/18/2022 03:10 PM   Modules accepted: Orders

## 2022-03-19 NOTE — Telephone Encounter (Signed)
Noted, DME order signed.

## 2022-03-19 NOTE — Addendum Note (Signed)
Addended by: Pleas Koch on: 03/19/2022 03:36 PM   Modules accepted: Orders

## 2022-03-21 ENCOUNTER — Telehealth: Payer: Self-pay | Admitting: Primary Care

## 2022-03-21 NOTE — Telephone Encounter (Addendum)
Community message sent to [facility] [employer] team. Got confirmation that the order was received.

## 2022-03-21 NOTE — Telephone Encounter (Signed)
[employer] called stating they received a referral from [place]. [employer] stated they are a medicaid based supply group & they saw the pt doesn't have medcaid coverage, just medicare & tricare. [employer] stated they couldn't assist the pt. Call back # [phone]

## 2022-03-22 NOTE — Telephone Encounter (Signed)
Received message back from [facility] [employer] team that patient would need to reach out to insurance.   Called and advised patients son that I have printed DME order for him to come pick up and take to medical supply store to obtain the incontinence supplies.

## 2022-03-22 NOTE — Telephone Encounter (Signed)
Sent community message to [facility] team asking for assistance with this.

## 2022-03-22 NOTE — Telephone Encounter (Signed)
Received message back from [facility] team that patient would need to reach out to insurance.    Called and advised patients son that I have printed DME order for him to come pick up and take to medical supply store to obtain the incontinence supplies.

## 2022-03-29 ENCOUNTER — Telehealth: Payer: Self-pay | Admitting: Primary Care

## 2022-03-29 NOTE — Telephone Encounter (Signed)
Patients son called in stating that patient bp was elevated to 210/110. She wasn't experiencing any other symptoms such as dizziness,or blurry vision. He did state  that she was feeling nervous. Due to the elevated b/p ,I sent her to access nurse to be triaged.

## 2022-03-29 NOTE — Telephone Encounter (Signed)
Called and spoke to patients son, he states the patient is very worked up still and they are trying to calm her down. She is not experiencing any symptoms at present.   Rechecked BP on the phone: 198/105  Scheduled for 03/30/22 @ 3:40  Gave ED precautions.

## 2022-03-29 NOTE — Telephone Encounter (Signed)
Noted  

## 2022-03-29 NOTE — Telephone Encounter (Signed)
Called and spoke with patients son and Home health nurse:  Rechecked BP: 204/110  They do not have readings logged from the last few weeks. This is what they have:  Last Wed 155/85, last Thurs. 179/101, Today 223/110  Home health nurse confirmed patient is taking irbesartan 300 mg daily without missing doses.  Patients son is working on arranging transportation to try and get patient seen per access nurse. They were waiting on a response from [employer] to see what she would advise.

## 2022-03-29 NOTE — Telephone Encounter (Signed)
Please call patient:  Have them recheck her BP. What is her reading? How has her BP been running over the last few weeks?  Is she taking Irbesartan 300 mg once daily for blood pressure?

## 2022-03-29 NOTE — Telephone Encounter (Signed)
Melonie RN with access nurse called due to pt BP 210/110 pt needs to be seen within 4 hrs. Pts son will have to arrange transportation to go anywhere. No available appts at [facility] or [facility]  this afternoon. Melonie said would advise pts son to go to UC or Ed. Per access nurse note pts son will try to arrange transportation to [facility] or ED. Sending note to Gentry Fitz NP and Carlis Abbott pool and will teams [facility] CMA.    [place] Day - Client TELEPHONE ADVICE RECORD AccessNurse Patient Name: Danielle Zamora Gender: Female DOB: 1945-11-02 Age: 77 Y 9 M 26 D Return Phone Number: [phone] (Primary), [phone] (Secondary) Address: City/ State/ Zip: [place]  [address] Client [facility] - Client Client Site [place] - Day Provider Alma Friendly - NP Contact Type Call Who Is Calling Patient / Member / Family / Caregiver Call Type Triage / Clinical Caller Name Loma Sousa Relationship To Patient Son Return Phone Number [phone] (Secondary) Chief Complaint BLOOD PRESSURE HIGH - Systolic (top number) [id number] or greater Reason for Call Symptomatic / Request for [place] states a pt is having a elevated blood pressure of 210/110. She is feeling very nervous right now. Translation No Nurse Assessment Nurse: Lenon Curt, RN, Melanie Date/Time (Eastern Time): 03/29/2022 1:47:08 PM Confirm and document reason for call. If symptomatic, describe symptoms. ---Caller states mother has elevated blood pressure of 210/110. Feeling very nervous right now. 45 minutes ago or more. HHPT, pre-therapy BP check. Does the patient have any new or worsening symptoms? ---Yes Will a triage be completed? ---Yes Related visit to physician within the last 2 weeks? ---Yes Does the PT have any chronic conditions? (i.e. diabetes, asthma, this includes High risk factors for pregnancy, etc.) ---Yes List  chronic conditions. ---HTN, cholesterol, gout, gabapentin, methacarbimol, Is this a behavioral health or substance abuse call? ---No Guidelines Guideline Title Affirmed Question Affirmed Notes Nurse Date/Time (Eastern Time) Blood Pressure - High [id number] Systolic BP >= A999333 OR Diastolic >= 123456 AND [id number] having NO cardiac or neurologic symptoms Howser, RN, Threasa Beards 03/29/2022 1:49:52 PM PLEASE NOTE: All timestamps contained within this report are represented as [place] Standard Time. CONFIDENTIALTY NOTICE: This fax transmission is intended only for the addressee. It contains information that is legally privileged, confidential or otherwise protected from use or disclosure. If you are not the intended recipient, you are strictly prohibited from reviewing, disclosing, copying using or disseminating any of this information or taking any action in reliance on or regarding this information. If you have received this fax in error, please notify us immediately by telephone so that we can arrange for its return to [place]. Phone: [phone], Toll-Free: [phone], Fax: [phone] Page: 2 of 2 Call Id: [id number] [place]. Time Eilene Ghazi Time) Disposition Final User 03/29/2022 1:46:02 PM Send to Urgent Queue Marice Potter 03/29/2022 1:52:09 PM See HCP within 4 Hours (or PCP triage) Yes Lenon Curt, RN, Melanie Final Disposition 03/29/2022 1:52:09 PM See HCP within 4 Hours (or PCP triage) Yes Lenon Curt, RN, Donnajean Lopes Disagree/Comply Comply Caller Understands Yes PreDisposition Call Doctor Care Advice Given Per Guideline SEE HCP (OR PCP TRIAGE) WITHIN 4 HOURS: * IF OFFICE WILL BE OPEN: You need to be seen within the next 3 or 4 hours. Call your doctor (or NP/PA) now or as soon as the office opens. * ED: Patients who may need surgery or hospital admission need to be sent to an ED. So  do most patients with serious symptoms or complex medical problems. * UCC: Some UCCs can manage patients who are stable and have  less serious symptoms (e.g., minor illnesses and injuries). The triager must know the [facility] capabilities before sending a patient there. If unsure, call ahead. * OFFICE: If patient sounds stable and not seriously ill, consult PCP (or follow your office policy) to see if patient can be seen NOW in office. CALL BACK IF: * Weakness or numbness of the face, arm or leg on one side of the body occurs * Difficulty walking, difficulty talking, or severe headache occurs * Chest pain or difficulty breathing occurs * You become worse CARE ADVICE given per High Blood Pressure (Adult) guideline. Comments User: Erik Obey, RN Date/Time Eilene Ghazi Time): 03/29/2022 1:56:23 PM Backline - no appts in all 3 offices. User: Erik Obey, RN Date/Time Eilene Ghazi Time): 03/29/2022 1:56:58 PM Will see about transportation to get her seen at [facility]

## 2022-03-29 NOTE — Telephone Encounter (Signed)
If she is asymptomatic then I'm happy to add her on tomorrow (03/30/22) at 3:40 pm.   If she does experience severe headaches, chest pain, etc. Then needs ED evaluation.

## 2022-03-30 ENCOUNTER — Ambulatory Visit: Payer: Medicare Other | Admitting: Primary Care

## 2022-03-31 ENCOUNTER — Telehealth: Payer: Self-pay | Admitting: Primary Care

## 2022-03-31 NOTE — Telephone Encounter (Signed)
Received via email from patient family below request.   Good afternoon, I am working to secure a placement for [employer]. Currently she has an application out to [facility] [place], however, there is another facility that I need the FL2 and the physical sent to which is [facility] in [place]. Upon talking with admission at that location, they advise that they do have a VA contract and work with [employer] and since she has that insurance this may be one option for [place].  If you would please send the [facility] and the physical to both locations that would be greatly appreciated.  A prayer email had all the [facility] information.  Here is the information I have for [facility] in [place]: Engineer, agricultural. Phone [phone]. Facts [phone].  We are very grateful for your assistance and trying to get her place so that we can get her better quality of care at this stage of her life. Please do not hesitate to contact us if we are able to facilitate the application process.  With great appreciation,  [name] and Nazia Haze [phone] [name] [phone]. Sapphira

## 2022-03-31 NOTE — Telephone Encounter (Signed)
Patient called in and would like a copy of her lab results from 02/10/2022 printed out. Her sons courtnee and his wife will come by and pick them up.

## 2022-03-31 NOTE — Telephone Encounter (Signed)
Advised patients son, labs are printed and placed up reception to be picked up.

## 2022-04-01 ENCOUNTER — Ambulatory Visit: Payer: Medicare Other | Admitting: Primary Care

## 2022-04-01 NOTE — Telephone Encounter (Signed)
FL2 forms placed in your inbox for review and completion

## 2022-04-01 NOTE — Telephone Encounter (Signed)
Danielle Zamora completed form and I have faxed to requested facilities. Received fax confirmation.

## 2022-04-01 NOTE — Telephone Encounter (Signed)
I do not see either FL2 form in my inbox. Do you still have these?

## 2022-04-05 ENCOUNTER — Telehealth: Payer: Self-pay | Admitting: Primary Care

## 2022-04-05 NOTE — Telephone Encounter (Signed)
Patient dropped off document FL2, to be filled out by provider. Patient requested to send it via Fax within ASAP. Document is located in providers tray at front office.Please advise at [phone]. Would like a copy of the completed form.

## 2022-04-05 NOTE — Telephone Encounter (Signed)
Called and notified patients son that forms have been copied and placed up front for pickup.

## 2022-04-05 NOTE — Telephone Encounter (Signed)
Son and daughter in law would like to know if they can come and pick up a physical copy of the [facility] and physical  on patient? She stated that at this point they are driving to different facilities to see which one will accept her.

## 2022-04-05 NOTE — Telephone Encounter (Signed)
Form has been completed and faxed to [employer] as requested. Copy of form is placed with reception for pickup. Patients daughter in law is aware.

## 2022-04-06 NOTE — Telephone Encounter (Signed)
Completed and handed to [facility].

## 2022-04-06 NOTE — Telephone Encounter (Signed)
Pt's daughter brought by ppw from the [place] for [place] to complete. Pt also asked for FL2 & progress notes/physical notes to be sent to [place] center. Ppw is in [employer].

## 2022-04-06 NOTE — Telephone Encounter (Signed)
Faxed completed VA form to the number on form.   Patients son has been made aware.  Copy is up front at reception for pickup.

## 2022-04-06 NOTE — Telephone Encounter (Signed)
Faxed the FL2 and physical notes to [facility] to the fax number provided.   VA paperwork placed in your box for review and completion.  Copy made of original per patient request.

## 2022-04-07 ENCOUNTER — Telehealth: Payer: Self-pay

## 2022-04-07 NOTE — Telephone Encounter (Signed)
Daughter in law called she is in town trying to get her placed in facility. She is needing note stating that she is here to help get Danielle Zamora taken care of. That she has been working with our office to get this done. She does not need fmla but employer has asked for something in order to cover her. She is also staying with patient at night until placement is made. Does not need to be specific just something on our letter head saying that she is helping with care of patient. She came into town 3/12 and does not have placement date at this time.

## 2022-04-07 NOTE — Telephone Encounter (Signed)
Sapphire Flannigan- patients daughter in law

## 2022-04-07 NOTE — Telephone Encounter (Signed)
Note printed and placed in [place]

## 2022-04-07 NOTE — Telephone Encounter (Signed)
Happy to help, but can we have the daughter in [place] name?

## 2022-04-08 ENCOUNTER — Other Ambulatory Visit: Payer: Self-pay | Admitting: Primary Care

## 2022-04-08 DIAGNOSIS — M25551 Pain in right hip: Secondary | ICD-10-CM

## 2022-04-12 ENCOUNTER — Other Ambulatory Visit: Payer: Medicare Other

## 2022-04-12 NOTE — Telephone Encounter (Signed)
Called and spoke with Sapphira, she stated [facility] is requiring a MD signature under [place] before they will accept the form.   Dr. Diona Browner- Please sign FL2 form (Placed in your inbox) under [place] and return form to my inbox. Thank you!

## 2022-04-12 NOTE — Telephone Encounter (Signed)
Patient daughter in law Sapphira sent over an email with [facility] paperwork. She stated that it needs to be signed by doctor. I placed all emails and FL2 in [place] box up front. Thank you!

## 2022-04-27 ENCOUNTER — Ambulatory Visit: Admission: RE | Admit: 2022-04-27 | Payer: Medicare Other | Source: Ambulatory Visit

## 2022-12-21 ENCOUNTER — Other Ambulatory Visit (INDEPENDENT_AMBULATORY_CARE_PROVIDER_SITE_OTHER): Payer: Self-pay | Admitting: Nurse Practitioner

## 2022-12-21 DIAGNOSIS — R6889 Other general symptoms and signs: Secondary | ICD-10-CM

## 2022-12-21 DIAGNOSIS — I739 Peripheral vascular disease, unspecified: Secondary | ICD-10-CM

## 2022-12-22 ENCOUNTER — Ambulatory Visit (INDEPENDENT_AMBULATORY_CARE_PROVIDER_SITE_OTHER): Payer: Medicare Other | Admitting: Nurse Practitioner

## 2022-12-22 ENCOUNTER — Encounter (INDEPENDENT_AMBULATORY_CARE_PROVIDER_SITE_OTHER): Payer: Self-pay | Admitting: Nurse Practitioner

## 2022-12-22 ENCOUNTER — Ambulatory Visit (INDEPENDENT_AMBULATORY_CARE_PROVIDER_SITE_OTHER): Payer: Medicare Other

## 2022-12-22 VITALS — BP 150/94 | HR 90 | Resp 18 | Ht 63.0 in | Wt 195.0 lb

## 2022-12-22 DIAGNOSIS — I10 Essential (primary) hypertension: Secondary | ICD-10-CM | POA: Diagnosis not present

## 2022-12-22 DIAGNOSIS — Z720 Tobacco use: Secondary | ICD-10-CM | POA: Diagnosis not present

## 2022-12-22 DIAGNOSIS — R6889 Other general symptoms and signs: Secondary | ICD-10-CM | POA: Diagnosis not present

## 2022-12-22 DIAGNOSIS — I739 Peripheral vascular disease, unspecified: Secondary | ICD-10-CM

## 2022-12-22 LAB — VAS US ABI WITH/WO TBI
Left ABI: 0.83
Right ABI: 0.71

## 2022-12-23 ENCOUNTER — Telehealth (INDEPENDENT_AMBULATORY_CARE_PROVIDER_SITE_OTHER): Payer: Self-pay

## 2022-12-23 NOTE — Telephone Encounter (Signed)
Left a message returning patient call that she left on the nurse line.

## 2022-12-26 NOTE — Progress Notes (Signed)
Subjective:    Patient ID: Danielle Zamora, female    DOB: [birth year], 77 y.o.   MRN: [id number] Chief Complaint  Patient presents with   New Patient (Initial Visit)    NP. consult. severe pad. outside abi in referral packet. slade-hartman    The patient is a 77 year old female who was referred by her primary care provider in regards to concern for peripheral arterial disease.  The patient is in a wheelchair today and notes that she does not walk significant distances.  Because of this she denies any claudication-like symptoms.  She denies any rest pain.  Currently there are no open wounds or ulcerations on her lower extremities.  Today she has a right ABI 0.71 on the left and 0.83.  She has a TBI 0.25 on the right and 0.18 on the left.  There are monophasic tibial waveforms bilaterally with diminished toe waveforms.    Review of Systems  Cardiovascular:  Negative for leg swelling.  Musculoskeletal:  Positive for gait problem.  Neurological:  Positive for weakness.  All other systems reviewed and are negative.      Objective:   Physical Exam Vitals reviewed.  HENT:     Head: Normocephalic.  Cardiovascular:     Rate and Rhythm: Normal rate.     Pulses:          Dorsalis pedis pulses are detected w/ Doppler on the right side and detected w/ Doppler on the left side.       Posterior tibial pulses are detected w/ Doppler on the right side and detected w/ Doppler on the left side.  Pulmonary:     Effort: Pulmonary effort is normal.  Skin:    General: Skin is warm and dry.  Neurological:     Mental Status: She is alert and oriented to person, place, and time.  Psychiatric:        Mood and Affect: Mood normal.        Behavior: Behavior normal.        Thought Content: Thought content normal.        Judgment: Judgment normal.     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   Pernicious anemia     Social History   Socioeconomic History   Marital status: Widowed    Spouse name: Not on file   Number of children: Not on file   Years of education: Not on file   Highest education level: Not on file  Occupational History   Not on file  Tobacco Use   Smoking status: Every Day    Current packs/day: 0.75    Average packs/day: 0.8 packs/day for 56.0 years (42.0 ttl pk-yrs)    Types: Cigarettes   Smokeless tobacco: Never  Vaping Use   Vaping status: Never Used  Substance and Sexual Activity   Alcohol use: Not Currently    Comment: see note has been at assisted living facility   Drug use: Not Currently    Types: Marijuana    Comment: see note has been at assisted living facility   Sexual activity: Not Currently  Other Topics Concern   Not on file  Social History Narrative   Widow.  3 children, 9 grandchildren   Retired.    Moved from [place].   Enjoys spending time with family.   Social Drivers of Corporate investment banker Strain: Low Risk  (01/14/2019)   Overall Financial Resource Strain (CARDIA)    Difficulty of Paying Living Expenses: Not hard at all  Food Insecurity: No Food Insecurity (01/14/2019)   Hunger Vital Sign    Worried About Running Out of Food in the Last Year: Never true    Ran Out of Food in the Last Year: Never true  Transportation Needs: No Transportation Needs (01/14/2019)   PRAPARE - Administrator, Civil Service (Medical): No    Lack of Transportation (Non-Medical): No  Physical Activity: Inactive (01/14/2019)   Exercise Vital Sign    Days of Exercise per Week: 0 days    Minutes of Exercise per Session: 0 min  Stress: No Stress Concern Present (01/14/2019)   [employer] of Occupational Health - Occupational Stress Questionnaire    Feeling of Stress : Only a little  Social Connections: Not  on file  Intimate Partner Violence: Not At Risk (01/14/2019)   Humiliation, Afraid, Rape, and Kick questionnaire    Fear of Current or Ex-Partner: No    Emotionally Abused: No    Physically Abused: No    Sexually Abused: No    Past Surgical History:  Procedure Laterality Date   BIOPSY  05/30/2021   Procedure: BIOPSY;  Surgeon: Imogene Burn, MD;  Location: [facility] ENDOSCOPY;  Service: Gastroenterology;;   COLONOSCOPY WITH PROPOFOL N/A 05/30/2021   Procedure: COLONOSCOPY WITH PROPOFOL;  Surgeon: Imogene Burn, MD;  Location: [facility] ENDOSCOPY;  Service: Gastroenterology;  Laterality: N/A;   ESOPHAGOGASTRODUODENOSCOPY (EGD) WITH PROPOFOL N/A 05/30/2021   Procedure: ESOPHAGOGASTRODUODENOSCOPY (EGD) WITH PROPOFOL;  Surgeon: Imogene Burn, MD;  Location: [facility] ENDOSCOPY;  Service: Gastroenterology;  Laterality: N/A;   IMPACTION REMOVAL  05/30/2021   Procedure: IMPACTION REMOVAL;  Surgeon: Imogene Burn, MD;  Location: [facility] ENDOSCOPY;  Service: Gastroenterology;;   PARATHYROIDECTOMY  10/04/2021   POLYPECTOMY  05/30/2021   Procedure: POLYPECTOMY;  Surgeon: Imogene Burn, MD;  Location: [facility] ENDOSCOPY;  Service: Gastroenterology;;    Family History  Problem Relation Age of Onset   Kidney disease Mother    Breast cancer Sister     Allergies  Allergen Reactions   Penicillins Swelling    Has patient had a PCN reaction causing immediate rash, facial/tongue/throat swelling, SOB or lightheadedness with hypotension: Yes Has patient had a PCN reaction causing severe rash involving mucus membranes or skin necrosis: Yes Has patient had a PCN reaction that required hospitalization: No Has patient had a PCN reaction occurring within the last 10 years: No If all of the above answers are "NO", then may proceed with Cephalosporin use.        Latest Ref Rng & Units 12/10/2021    8:05 PM 10/28/2021    8:06 PM 10/28/2021    7:57 PM  CBC  WBC 4.0 - 10.5 K/uL 7.9  7.3    Hemoglobin 12.0 - 15.0 g/dL 40.3  9.1   9.5   Hematocrit 36.0 - 46.0 % 37.7  29.4  28.0   Platelets 150 - 400 K/uL 368  326        CMP     Component Value Date/Time   NA 142 02/10/2022 1134   K 4.0 02/10/2022 1134   CL 108 02/10/2022 1134   CO2 25 02/10/2022 1134   GLUCOSE 118 (  H) 02/10/2022 1134   BUN 12 02/10/2022 1134   CREATININE 0.77 02/10/2022 1134   CALCIUM 8.5 02/10/2022 1134   PROT 7.0 02/10/2022 1134   ALBUMIN 3.4 (L) 02/10/2022 1134   AST 18 02/10/2022 1134   ALT 10 02/10/2022 1134   ALKPHOS 165 (H) 02/10/2022 1134   BILITOT 0.5 02/10/2022 1134   GFR 74.26 02/10/2022 1134   GFRNONAA >60 12/10/2021 2005     VAS [place] ABI WITH/WO TBI Result Date: 12/22/2022  LOWER EXTREMITY DOPPLER STUDY Patient Name:  EBONEY CHIARA  Date of Exam:   12/22/2022 Medical Rec #: [id number]      Accession #:    [id number] Date of Birth: [birth year]      Patient Gender: F Patient Age:   14 years Exam Location:  [facility] Procedure:      VAS [place] ABI WITH/WO TBI Referring Phys: Vivia Birmingham Wilmetta Speiser --------------------------------------------------------------------------------  High Risk Factors: Hypertension, hyperlipidemia, current smoker.  Limitations: Today's exam was limited due to patient in wheelchair. Performing Technologist: Hardie Lora RVT  Examination Guidelines: A complete evaluation includes at minimum, Doppler waveform signals and systolic blood pressure reading at the level of bilateral brachial, anterior tibial, and posterior tibial arteries, when vessel segments are accessible. Bilateral testing is considered an integral part of a complete examination. Photoelectric Plethysmograph (PPG) waveforms and toe systolic pressure readings are included as required and additional duplex testing as needed. Limited examinations for reoccurring indications may be performed as noted.  ABI Findings: +---------+------------------+-----+----------+--------+ Right    Rt Pressure (mmHg)IndexWaveform  Comment   +---------+------------------+-----+----------+--------+ Brachial 177                                       +---------+------------------+-----+----------+--------+ PTA      128               0.71 monophasic         +---------+------------------+-----+----------+--------+ DP       109               0.61 monophasic         +---------+------------------+-----+----------+--------+ Great Toe45                0.25                    +---------+------------------+-----+----------+--------+ +---------+------------------+-----+----------+-------+ Left     Lt Pressure (mmHg)IndexWaveform  Comment +---------+------------------+-----+----------+-------+ Brachial 180                                      +---------+------------------+-----+----------+-------+ PTA      149               0.83 monophasic        +---------+------------------+-----+----------+-------+ DP       150               0.83 biphasic          +---------+------------------+-----+----------+-------+ Great Toe32                0.18                   +---------+------------------+-----+----------+-------+ +-------+-----------+-----------+------------+------------+ ABI/TBIToday's ABIToday's TBIPrevious ABIPrevious TBI +-------+-----------+-----------+------------+------------+ Right  0.71       0.25                                +-------+-----------+-----------+------------+------------+  Left   0.83       0.18                                +-------+-----------+-----------+------------+------------+  Summary: Right: Resting right ankle-brachial index indicates moderate right lower extremity arterial disease. The right toe-brachial index is abnormal. Left: Resting left ankle-brachial index indicates mild left lower extremity arterial disease. The left toe-brachial index is abnormal. *See table(s) above for measurements and observations.  Electronically signed by Levora Dredge MD on  12/22/2022 at 4:44:40 PM.    Final        Assessment & Plan:   1. PAD (peripheral artery disease) (HCC) (Primary) I had a long discussion with the patient regarding her peripheral arterial disease as well as her symptoms.  She does not have significant symptoms but she does have significant peripheral arterial disease.  We discussed angiogram and what is involved with intervention.  We also discussed moving forward with close monitoring.  If the patient elects to do close monitoring, we will follow her in 3 months or sooner if she begins to develop rest pain like symptoms or open wounds or ulcerations.  The patient would like to consider it at this time and discussed with her family.  Patient advised to contact our office once she has made her decision.  2. Essential hypertension Continue antihypertensive medications as already ordered, these medications have been reviewed and there are no changes at this time.  3. Tobacco abuse Smoking cessation was discussed, 3-10 minutes spent on this topic specifically   Current Outpatient Medications on File Prior to Visit  Medication Sig Dispense Refill   acetaminophen (TYLENOL) 650 MG CR tablet Take 650 mg by mouth.     allopurinol (ZYLOPRIM) 100 MG tablet Take 1 tablet (100 mg total) by mouth daily. For gout prevention 90 tablet 0   gabapentin (NEURONTIN) 100 MG capsule Take 1 capsule (100 mg total) by mouth daily. For pain 90 capsule 3   gabapentin (NEURONTIN) 300 MG capsule Take 1 capsule (300 mg total) by mouth at bedtime. For pain and sleep 90 capsule 3   irbesartan (AVAPRO) 300 MG tablet Take 300 mg by mouth daily.     lidocaine (LIDODERM) 5 % APPLY 1 PATCH ON SKIN EVERY 12 HOURS THEN REMOVE FOR 12 HOURS 30 patch 0   methocarbamol (ROBAXIN) 500 MG tablet Take 1 tablet (500 mg total) by mouth every 8 (eight) hours as needed for muscle spasms. 90 tablet 0   nystatin cream (MYCOSTATIN) APPLY TOPICALLY TO THE AFFECTED AREA TWICE DAILY 60 g 0    traMADol (ULTRAM) 50 MG tablet Take 50 mg by mouth 3 (three) times daily as needed.     atorvastatin (LIPITOR) 40 MG tablet Take 40 mg by mouth daily. for cholesterol. (Patient not taking: Reported on 12/22/2022)     colchicine 0.6 MG tablet TAKE TWO TABLETS BY MOUTH AT ONSET OF GOUT FLARE. TAKE 1 ADDITIONAL TABLET TWO HOURS LATER. THEN TAKE 1 TABLET TWICE DAILY UNTIL FLARE RESOLVES (Patient not taking: Reported on 12/22/2022) 60 tablet 0   No current facility-administered medications on file prior to visit.    There are no Patient Instructions on file for this visit. No follow-ups on file.   Georgiana Spinner, NP

## 2023-04-18 ENCOUNTER — Ambulatory Visit

## 2023-04-21 ENCOUNTER — Inpatient Hospital Stay
Admission: EM | Admit: 2023-04-21 | Discharge: 2023-04-24 | DRG: 871 | Disposition: A | Discharge status: Skilled Nursing Facility | Source: Skilled Nursing Facility | Attending: Internal Medicine | Admitting: Internal Medicine

## 2023-04-21 ENCOUNTER — Emergency Department

## 2023-04-21 ENCOUNTER — Other Ambulatory Visit: Payer: Self-pay

## 2023-04-21 DIAGNOSIS — F17210 Nicotine dependence, cigarettes, uncomplicated: Secondary | ICD-10-CM | POA: Diagnosis present

## 2023-04-21 DIAGNOSIS — Z5181 Encounter for therapeutic drug level monitoring: Secondary | ICD-10-CM

## 2023-04-21 DIAGNOSIS — Z791 Long term (current) use of non-steroidal anti-inflammatories (NSAID): Secondary | ICD-10-CM

## 2023-04-21 DIAGNOSIS — Z9889 Other specified postprocedural states: Secondary | ICD-10-CM

## 2023-04-21 DIAGNOSIS — J690 Pneumonitis due to inhalation of food and vomit: Secondary | ICD-10-CM

## 2023-04-21 DIAGNOSIS — J189 Pneumonia, unspecified organism: Secondary | ICD-10-CM | POA: Diagnosis present

## 2023-04-21 DIAGNOSIS — I452 Bifascicular block: Secondary | ICD-10-CM | POA: Diagnosis present

## 2023-04-21 DIAGNOSIS — E872 Acidosis, unspecified: Secondary | ICD-10-CM

## 2023-04-21 DIAGNOSIS — E8721 Acute metabolic acidosis: Secondary | ICD-10-CM

## 2023-04-21 DIAGNOSIS — J9601 Acute respiratory failure with hypoxia: Secondary | ICD-10-CM | POA: Diagnosis not present

## 2023-04-21 DIAGNOSIS — A419 Sepsis, unspecified organism: Principal | ICD-10-CM | POA: Diagnosis present

## 2023-04-21 DIAGNOSIS — N179 Acute kidney failure, unspecified: Secondary | ICD-10-CM

## 2023-04-21 DIAGNOSIS — R509 Fever, unspecified: Principal | ICD-10-CM

## 2023-04-21 DIAGNOSIS — I10 Essential (primary) hypertension: Secondary | ICD-10-CM | POA: Diagnosis present

## 2023-04-21 DIAGNOSIS — Z1152 Encounter for screening for COVID-19: Secondary | ICD-10-CM

## 2023-04-21 DIAGNOSIS — R000 Tachycardia, unspecified: Secondary | ICD-10-CM

## 2023-04-21 DIAGNOSIS — Z9089 Acquired absence of other organs: Secondary | ICD-10-CM

## 2023-04-21 DIAGNOSIS — R7989 Other specified abnormal findings of blood chemistry: Secondary | ICD-10-CM

## 2023-04-21 DIAGNOSIS — E875 Hyperkalemia: Secondary | ICD-10-CM | POA: Diagnosis present

## 2023-04-21 DIAGNOSIS — I4892 Unspecified atrial flutter: Secondary | ICD-10-CM | POA: Diagnosis present

## 2023-04-21 DIAGNOSIS — R6520 Severe sepsis without septic shock: Secondary | ICD-10-CM | POA: Diagnosis present

## 2023-04-21 LAB — COMPREHENSIVE METABOLIC PANEL WITH GFR
ALT: 18 U/L (ref 0–44)
AST: 29 U/L (ref 15–41)
Albumin: 3.8 g/dL (ref 3.5–5.0)
Alkaline Phosphatase: 99 U/L (ref 38–126)
Anion gap: 11 (ref 5–15)
BUN: 45 mg/dL — ABNORMAL HIGH (ref 8–23)
CO2: 19 mmol/L — ABNORMAL LOW (ref 22–32)
Calcium: 8.3 mg/dL — ABNORMAL LOW (ref 8.9–10.3)
Chloride: 103 mmol/L (ref 98–111)
Creatinine, Ser: 2.35 mg/dL — ABNORMAL HIGH (ref 0.44–1.00)
GFR, Estimated: 21 mL/min — ABNORMAL LOW (ref >60)
Glucose, Bld: 176 mg/dL — ABNORMAL HIGH (ref 70–99)
Potassium: 5.7 mmol/L — ABNORMAL HIGH (ref 3.5–5.1)
Sodium: 133 mmol/L — ABNORMAL LOW (ref 135–145)
Total Bilirubin: 1.2 mg/dL (ref 0.0–1.2)
Total Protein: 8.5 g/dL — ABNORMAL HIGH (ref 6.5–8.1)

## 2023-04-21 LAB — CBC WITH DIFFERENTIAL/PLATELET
Abs Immature Granulocytes: 0.13 10*3/uL — ABNORMAL HIGH (ref 0.00–0.07)
Basophils Absolute: 0 10*3/uL (ref 0.0–0.1)
Basophils Relative: 0 %
Eosinophils Absolute: 0 10*3/uL (ref 0.0–0.5)
Eosinophils Relative: 0 %
HCT: 36.7 % (ref 36.0–46.0)
Hemoglobin: 11.9 g/dL — ABNORMAL LOW (ref 12.0–15.0)
Immature Granulocytes: 1 %
Lymphocytes Relative: 6 %
Lymphs Abs: 0.7 10*3/uL (ref 0.7–4.0)
MCH: 29.8 pg (ref 26.0–34.0)
MCHC: 32.4 g/dL (ref 30.0–36.0)
MCV: 92 fL (ref 80.0–100.0)
Monocytes Absolute: 0.7 10*3/uL (ref 0.1–1.0)
Monocytes Relative: 6 %
Neutro Abs: 9.9 10*3/uL — ABNORMAL HIGH (ref 1.7–7.7)
Neutrophils Relative %: 87 %
Platelets: 183 10*3/uL (ref 150–400)
RBC: 3.99 MIL/uL (ref 3.87–5.11)
RDW: 14.3 % (ref 11.5–15.5)
WBC: 11.5 10*3/uL — ABNORMAL HIGH (ref 4.0–10.5)
nRBC: 0.2 % (ref 0.0–0.2)

## 2023-04-21 LAB — URINALYSIS, W/ REFLEX TO CULTURE (INFECTION SUSPECTED)
Bilirubin Urine: NEGATIVE
Glucose, UA: NEGATIVE mg/dL
Ketones, ur: NEGATIVE mg/dL
Leukocytes,Ua: NEGATIVE
Nitrite: NEGATIVE
Protein, ur: >=300 mg/dL — AB
Specific Gravity, Urine: 1.016 (ref 1.005–1.030)
pH: 5 (ref 5.0–8.0)

## 2023-04-21 LAB — RESP PANEL BY RT-PCR (RSV, FLU A&B, COVID)  RVPGX2
Influenza A by PCR: NEGATIVE
Influenza B by PCR: NEGATIVE
Resp Syncytial Virus by PCR: NEGATIVE
SARS Coronavirus 2 by RT PCR: NEGATIVE

## 2023-04-21 LAB — PROTIME-INR
INR: 1.2 (ref 0.8–1.2)
Prothrombin Time: 15 s (ref 11.4–15.2)

## 2023-04-21 LAB — LACTIC ACID, PLASMA
Lactic Acid, Venous: 2.3 mmol/L (ref 0.5–1.9)
Lactic Acid, Venous: 2 mmol/L (ref 0.5–1.9)

## 2023-04-21 LAB — TROPONIN I (HIGH SENSITIVITY): Troponin I (High Sensitivity): 253 ng/L (ref <18)

## 2023-04-21 MED ORDER — ACETAMINOPHEN 650 MG RE SUPP: 650.0000 mg | Freq: Four times a day (QID) | RECTAL | Status: DC | PRN

## 2023-04-21 MED ORDER — ONDANSETRON HCL 4 MG PO TABS
4.0000 mg | ORAL_TABLET | Freq: Four times a day (QID) | ORAL | Status: DC | PRN
Start: 2023-04-21 — End: 2023-04-24

## 2023-04-21 MED ORDER — LACTATED RINGERS IV SOLN
150.0000 mL/h | INTRAVENOUS | Status: AC
  Administered 2023-04-21: 150 mL/h via INTRAVENOUS

## 2023-04-21 MED ORDER — LACTATED RINGERS IV BOLUS
1000.0000 mL | Freq: Once | INTRAVENOUS | Status: AC
  Administered 2023-04-21: 1000 mL via INTRAVENOUS

## 2023-04-21 MED ORDER — ASPIRIN 81 MG PO CHEW
324.0000 mg | CHEWABLE_TABLET | Freq: Once | ORAL | Status: AC
  Administered 2023-04-21: 324 mg via ORAL
  Filled 2023-04-21: qty 4

## 2023-04-21 MED ORDER — METHYLPREDNISOLONE SODIUM SUCC 40 MG IJ SOLR
40.0000 mg | Freq: Two times a day (BID) | INTRAMUSCULAR | Status: AC
  Administered 2023-04-21: 40 mg via INTRAVENOUS
  Filled 2023-04-21: qty 1

## 2023-04-21 MED ORDER — ACETAMINOPHEN 325 MG PO TABS
650.0000 mg | ORAL_TABLET | Freq: Once | ORAL | Status: AC
  Administered 2023-04-21: 650 mg via ORAL
  Filled 2023-04-21: qty 2

## 2023-04-21 MED ORDER — SODIUM CHLORIDE 0.9 % IV SOLN
500.0000 mg | INTRAVENOUS | Status: DC
  Administered 2023-04-21 – 2023-04-22 (×2): 500 mg via INTRAVENOUS
  Filled 2023-04-21 (×2): qty 5

## 2023-04-21 MED ORDER — ACETAMINOPHEN 325 MG PO TABS: 650.0000 mg | ORAL_TABLET | Freq: Four times a day (QID) | ORAL | Status: DC | PRN

## 2023-04-21 MED ORDER — SODIUM CHLORIDE 0.9 % IV SOLN
2.0000 g | Freq: Once | INTRAVENOUS | Status: AC
  Administered 2023-04-21: 2 g via INTRAVENOUS
  Filled 2023-04-21: qty 12.5

## 2023-04-21 MED ORDER — FAMOTIDINE IN NACL 20-0.9 MG/50ML-% IV SOLN
20.0000 mg | Freq: Two times a day (BID) | INTRAVENOUS | Status: DC
  Administered 2023-04-21 – 2023-04-22 (×3): 20 mg via INTRAVENOUS
  Filled 2023-04-21 (×4): qty 50

## 2023-04-21 MED ORDER — PREDNISONE 20 MG PO TABS
40.0000 mg | ORAL_TABLET | Freq: Every day | ORAL | Status: DC
  Administered 2023-04-23 – 2023-04-24 (×2): 40 mg via ORAL
  Filled 2023-04-21 (×2): qty 2

## 2023-04-21 MED ORDER — ONDANSETRON HCL 4 MG/2ML IJ SOLN: 4.0000 mg | Freq: Four times a day (QID) | INTRAMUSCULAR | Status: DC | PRN

## 2023-04-21 MED ORDER — ENOXAPARIN SODIUM 30 MG/0.3ML IJ SOSY
30.0000 mg | PREFILLED_SYRINGE | INTRAMUSCULAR | Status: DC
  Administered 2023-04-22 – 2023-04-24 (×3): 30 mg via SUBCUTANEOUS
  Filled 2023-04-21 (×3): qty 0.3

## 2023-04-21 MED ORDER — ALBUTEROL SULFATE (2.5 MG/3ML) 0.083% IN NEBU: 2.5000 mg | INHALATION_SOLUTION | RESPIRATORY_TRACT | Status: DC | PRN

## 2023-04-21 MED ORDER — SODIUM CHLORIDE 0.9 % IV SOLN
2.0000 g | INTRAVENOUS | Status: DC
  Administered 2023-04-22 – 2023-04-24 (×3): 2 g via INTRAVENOUS
  Filled 2023-04-21 (×4): qty 20

## 2023-04-21 MED ORDER — CALCIUM GLUCONATE-NACL 1-0.675 GM/50ML-% IV SOLN
1.0000 g | Freq: Once | INTRAVENOUS | Status: AC
Start: 2023-04-21 — End: 2023-04-22
  Administered 2023-04-22: 1000 mg via INTRAVENOUS
  Filled 2023-04-21: qty 50

## 2023-04-21 MED ORDER — VANCOMYCIN HCL IN DEXTROSE 1-5 GM/200ML-% IV SOLN
1000.0000 mg | Freq: Once | INTRAVENOUS | Status: AC
  Administered 2023-04-21: 1000 mg via INTRAVENOUS
  Filled 2023-04-21: qty 200

## 2023-04-21 NOTE — Assessment & Plan Note (Signed)
 Potassium slightly elevated at 5.7, likely secondary to AKI Calcium gluconate x 1 BMP every 4 and correct as needed

## 2023-04-21 NOTE — ED Triage Notes (Addendum)
 Pt from [facility] via EMS- no report given by SNF- pt reported cramp yesterday w/ chills and congestion. Ate popcorn today- choked on it causing high pitched sound in lungs. Was gvn 2 albuterol treatments by fire dept.  103 temp  2 racemic epi 1000 mg tylenol 125 solumedrol 150 ST 100% RA 20 rr 104 BGL 20 gauge right AC Pt AOX4, NAD. Pt denies pain, expiratory wheeze noted. Strong ammonia/UTI smell noted, pt wears briefs.

## 2023-04-21 NOTE — ED Provider Notes (Signed)
 [facility] Provider Note    Event Date/Time   First MD Initiated Contact with Patient 04/21/23 1818     (approximate)   History   Chills  HPI  Danielle Zamora is a 78 y.o. female   who presents to the emergency department from living facility today because of concerns for some chills, congestion as well as a choking episode today.  Chills and congestions started yesterday.  Patient herself states that she has been having some chills.  She however denies any shortness of breath.  Denies any nausea vomiting or diarrhea.  The patient denies any dysuria.      Physical Exam   Triage Vital Signs: ED Triage Vitals  Encounter Vitals Group     BP 04/21/23 1810 (!) 141/70     Systolic BP Percentile --      Diastolic BP Percentile --      Pulse Rate 04/21/23 1810 (!) 125     Resp 04/21/23 1810 (!) 21     Temp 04/21/23 1811 (!) 102 F (38.9 C)     Temp Source 04/21/23 1811 Oral     SpO2 04/21/23 1810 (!) 86 %     Weight --      Height --      Head Circumference --      Peak Flow --      Pain Score 04/21/23 1810 0     Pain Loc --      Pain Education --      Exclude from Growth Chart --     Most recent vital signs: Vitals:   04/21/23 1813 04/21/23 1840  BP:    Pulse:  (!) 125  Resp:  20  Temp:    SpO2: 90% 96%   General: Awake, alert, oriented. CV:  Good peripheral perfusion. Tachycardia. Resp:  Normal effort. Lungs clear. Abd:  No distention. Non tender.  ED Results / Procedures / Treatments   Labs (all labs ordered are listed, but only abnormal results are displayed) Labs Reviewed  COMPREHENSIVE METABOLIC PANEL WITH GFR - Abnormal; Notable for the following components:      Result Value   Sodium 133 (*)    Potassium 5.7 (*)    CO2 19 (*)    Glucose, Bld 176 (*)    BUN 45 (*)    Creatinine, Ser 2.35 (*)    Calcium 8.3 (*)    Total Protein 8.5 (*)    GFR, Estimated 21 (*)    All other components within normal limits  LACTIC ACID,  PLASMA - Abnormal; Notable for the following components:   Lactic Acid, Venous 2.3 (*)    All other components within normal limits  URINALYSIS, W/ REFLEX TO CULTURE (INFECTION SUSPECTED) - Abnormal; Notable for the following components:   Color, Urine YELLOW (*)    APPearance HAZY (*)    Hgb urine dipstick MODERATE (*)    Protein, ur >=300 (*)    Bacteria, UA RARE (*)    All other components within normal limits  CBC WITH DIFFERENTIAL/PLATELET - Abnormal; Notable for the following components:   WBC 11.5 (*)    Hemoglobin 11.9 (*)    Neutro Abs 9.9 (*)    Abs Immature Granulocytes 0.13 (*)    All other components within normal limits  TROPONIN I (HIGH SENSITIVITY) - Abnormal; Notable for the following components:   Troponin I (High Sensitivity) 253 (*)    All other components within normal limits  RESP PANEL  BY RT-PCR (RSV, FLU A&B, COVID)  [id number]  CULTURE, BLOOD (ROUTINE X 2)  CULTURE, BLOOD (ROUTINE X 2)  PROTIME-INR  LACTIC ACID, PLASMA  CBC WITH DIFFERENTIAL/PLATELET     EKG  I, Phineas Semen, attending physician, personally viewed and interpreted this EKG  EKG Time: 1834 Rate: 125 Rhythm: sinus tachycardia Axis: right axis deviation Intervals: qtc 624 QRS: RBBB, LPFB ST changes: no st elevation Impression: abnormal ekg   RADIOLOGY I independently interpreted and visualized the CXR. My interpretation: No pneumonia. Radiology interpretation:  IMPRESSION:  Low lung volumes.  No acute cardiopulmonary findings.      PROCEDURES:  Critical Care performed: Yes  CRITICAL CARE Performed by: Phineas Semen   Total critical care time: 35 minutes  Critical care time was exclusive of separately billable procedures and treating other patients.  Critical care was necessary to treat or prevent imminent or life-threatening deterioration.  Critical care was time spent personally by me on the following activities: development of treatment plan with patient and/or  surrogate as well as nursing, discussions with consultants, evaluation of patient's response to treatment, examination of patient, obtaining history from patient or surrogate, ordering and performing treatments and interventions, ordering and review of laboratory studies, ordering and review of radiographic studies, pulse oximetry and re-evaluation of patient's condition.   Procedures    MEDICATIONS ORDERED IN ED: Medications - No data to display   IMPRESSION / MDM / ASSESSMENT AND PLAN / ED COURSE  I reviewed the triage vital signs and the nursing notes.                              Differential diagnosis includes, but is not limited to, UTI, pneumonia, bacteremia, viral illness  Patient's presentation is most consistent with acute presentation with potential threat to life or bodily function.   The patient is on the cardiac monitor to evaluate for evidence of arrhythmia and/or significant heart rate changes.  Patient presented to the emergency department today because of concerns for chills congestion and a possible choking episode.  On exam patient's primary complaint is for chills.  She was febrile here as well as tachycardic upon presentation.  Broad workup was thus initiated.  Chest x-ray without any obvious pneumonia.  Urine without urinary tract infection findings.  Blood work however did show elevated white blood cell count and elevated lactic acidosis.  Patient was started on broad-spectrum antibiotics and IV fluids.  Discussed with Dr. Para March with the hospitalist service who will evaluate for admission.    FINAL CLINICAL IMPRESSION(S) / ED DIAGNOSES   Final diagnoses:  Fever, unspecified fever cause  AKI (acute kidney injury) (HCC)  Lactic acidosis     Note:  This document was prepared using Dragon voice recognition software and may include unintentional dictation errors.    Phineas Semen, MD 04/21/23 2233

## 2023-04-21 NOTE — Assessment & Plan Note (Signed)
 As needed hydralazine while n.p.o.

## 2023-04-21 NOTE — H&P (Addendum)
 History and Physical    Patient: Danielle Zamora ZOX:[id number] DOB: Feb 08, 1945 DOA: 04/21/2023 DOS: the patient was seen and examined on 04/21/2023 PCP: Gabriel John, NP  Patient coming from: SNF  Chief Complaint: No chief complaint on file.   HPI: Danielle Zamora is a 78 y.o. female with medical history significant for  HTN, chronic hypercalcemia s/p parathyroidectomy(2023), being admitted with sepsis of unknown source (suspect respiratory) and elevated troponin in the 200s.  She was sent from her facility with a 1 day history of chills and congestion, and a reported choking episode while eating popcorn on the day of arrival.  Patient denies choking on popcorn.  She states that the smell of burnt popcorn being prepared for the facility for movie night causes her to have difficulty breathing.  She does endorse having chills from the day prior On arrival of EMS she was noted to have expiratory wheezing and increased work of breathing and treated with  albuterol, racemic epi and Solu-Medrol. ED course and data review: Tmax 103.1 and tachycardic to the 120s.  BP as high as 199/97, improved to 151/93 by admission.  Initial O2 sat 85% for which she was placed on nasal cannula at 4 L. Labs notable for the following: WBC 11,500 With lactic acid 2.3.  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 Chest x-ray nonacute  Treatment administered in the ED Chewable aspirin for elevated troponin Cefepime and vancomycin for possible pneumonia LR boluses for sepsis  Hospitalist consulted for admission.     Past Medical History:  Diagnosis Date   Cholelithiasis 05/24/2021   Essential hypertension    Hyperparathyroidism due to renal  insufficiency (HCC) 06/25/2020   Pernicious anemia    Past Surgical History:  Procedure Laterality Date   BIOPSY  05/30/2021   Procedure: BIOPSY;  Surgeon: Daina Drum, MD;  Location: [facility] ENDOSCOPY;  Service: Gastroenterology;;   COLONOSCOPY WITH PROPOFOL N/A 05/30/2021   Procedure: COLONOSCOPY WITH PROPOFOL;  Surgeon: Daina Drum, MD;  Location: [facility] ENDOSCOPY;  Service: Gastroenterology;  Laterality: N/A;   ESOPHAGOGASTRODUODENOSCOPY (EGD) WITH PROPOFOL N/A 05/30/2021   Procedure: ESOPHAGOGASTRODUODENOSCOPY (EGD) WITH PROPOFOL;  Surgeon: Daina Drum, MD;  Location: [facility] ENDOSCOPY;  Service: Gastroenterology;  Laterality: N/A;   IMPACTION REMOVAL  05/30/2021   Procedure: IMPACTION REMOVAL;  Surgeon: Daina Drum, MD;  Location: [facility] ENDOSCOPY;  Service: Gastroenterology;;   PARATHYROIDECTOMY  10/04/2021   POLYPECTOMY  05/30/2021   Procedure: POLYPECTOMY;  Surgeon: Daina Drum, MD;  Location: [facility] ENDOSCOPY;  Service: Gastroenterology;;   Social History:  reports that she has been smoking cigarettes. She has a 42 pack-year smoking history. She has never used smokeless tobacco. She reports that she does not currently use alcohol. She reports that she does not currently use drugs after having used the following drugs: Marijuana.  Allergies  Allergen Reactions   Penicillins Swelling    Has patient had a PCN reaction causing immediate rash, facial/tongue/throat swelling, SOB or lightheadedness with hypotension: Yes Has patient had a PCN reaction causing severe rash involving mucus membranes or skin necrosis: Yes Has patient had a PCN reaction that required hospitalization: No Has patient had a PCN reaction occurring within the last 10 years: No If all of the  above answers are "NO", then may proceed with Cephalosporin use.     Family History  Problem Relation Age of Onset   Kidney disease Mother    Breast cancer Sister     Prior to Admission medications   Medication Sig Start  Date End Date Taking? Authorizing Provider  acetaminophen (TYLENOL) 650 MG CR tablet Take 650 mg by mouth 2 (two) times daily.   Yes [provider]  allopurinol (ZYLOPRIM) 100 MG tablet Take 1 tablet (100 mg total) by mouth daily. For gout prevention 01/11/22 04/21/23 Yes Clark, Katherine K, NP  cetirizine (ZYRTEC) 10 MG tablet Take 10 mg by mouth daily.   Yes [provider]  cholecalciferol (VITAMIN D3) 25 MCG (1000 UNIT) tablet Take 1,000 Units by mouth daily.   Yes [provider]  cyanocobalamin (VITAMIN B12) 500 MCG tablet Take 500 mcg by mouth daily.   Yes [provider]  diclofenac Sodium (VOLTAREN) 1 % GEL Apply 4 g topically 2 (two) times daily. Apply 4 grams to right knee   Yes [provider]  furosemide (LASIX) 20 MG tablet Take 20 mg by mouth daily.   Yes [provider]  gabapentin (NEURONTIN) 100 MG capsule Take 1 capsule (100 mg total) by mouth daily. For pain 02/10/22  Yes Clark, Katherine K, NP  gabapentin (NEURONTIN) 300 MG capsule Take 1 capsule (300 mg total) by mouth at bedtime. For pain and sleep 02/10/22  Yes Clark, Katherine K, NP  irbesartan (AVAPRO) 300 MG tablet Take 300 mg by mouth daily. 01/12/22  Yes [provider]  lidocaine (LIDODERM) 5 % APPLY 1 PATCH ON SKIN EVERY 12 HOURS THEN REMOVE FOR 12 HOURS Patient taking differently: Place 1 patch onto the skin at bedtime. 1 patch to each knee 04/09/22  Yes Clark, Katherine K, NP  meloxicam (MOBIC) 15 MG tablet Take 7.5 mg by mouth daily.   Yes [provider]  methocarbamol (ROBAXIN) 500 MG tablet Take 1 tablet (500 mg total) by mouth every 8 (eight) hours as needed for muscle spasms. Patient taking differently: Take 500 mg by mouth every 6 (six) hours. 03/14/22  Yes Clark, Katherine K, NP  nystatin (MYCOSTATIN/NYSTOP) powder Apply 1 Application topically 2 (two) times daily as needed.   Yes [provider]  pravastatin (PRAVACHOL) 40 MG tablet Take  40 mg by mouth at bedtime.   Yes [provider]  senna-docusate (SENOKOT-S) 8.6-50 MG tablet Take 1 tablet by mouth 2 (two) times daily.   Yes [provider]  traMADol (ULTRAM) 50 MG tablet Take 50 mg by mouth daily. 09/20/22  Yes [provider]  traMADol (ULTRAM) 50 MG tablet Take 100 mg by mouth at bedtime.   Yes [provider]  triamcinolone cream (KENALOG) 0.1 % Apply 1 Application topically 2 (two) times daily as needed (apply to chest for itching).   Yes [provider]  Zinc Oxide 16 % OINT Apply 1 application  topically 2 (two) times daily. Apply small amount to buttocks twice a day with peri care 04/14/23 04/24/23 Yes [provider]  atorvastatin (LIPITOR) 40 MG tablet Take 40 mg by mouth daily. for cholesterol. Patient not taking: Reported on 12/22/2022    [provider]  colchicine 0.6 MG tablet TAKE TWO TABLETS BY MOUTH AT ONSET OF GOUT FLARE. TAKE 1 ADDITIONAL TABLET TWO HOURS LATER. THEN TAKE 1 TABLET TWICE DAILY UNTIL FLARE RESOLVES Patient not taking: Reported on 12/22/2022 01/07/22   Clark, Katherine K, NP  nystatin cream (MYCOSTATIN) APPLY TOPICALLY TO THE AFFECTED AREA TWICE DAILY Patient not taking: Reported on 04/21/2023 03/16/22   Gabriel John, NP    Physical Exam: Vitals:   04/21/23 1930 04/21/23 2014 04/21/23 2130 04/21/23 2200  BP: 113/87  (!) 199/97 (!) 151/93  Pulse: (!) 113  (!) 126 (!) 127  Resp: 12  18 16   Temp:  (!) 102.6 F (39.2 C)  (!) 103.1 F (39.5 C)  TempSrc:      SpO2: 93%  94% 95%   Physical Exam Vitals and nursing note reviewed.  Constitutional:      General: She is not in acute distress.    Appearance: She is obese.     Comments: Conversational dyspnea.  Is diaphoretic  HENT:     Head: Normocephalic and atraumatic.  Cardiovascular:     Rate and Rhythm: Regular rhythm. Tachycardia present.     Heart sounds: Normal heart sounds.  Pulmonary:     Effort: Tachypnea present.      Breath sounds: Normal breath sounds.  Abdominal:     Palpations: Abdomen is soft.     Tenderness: There is no abdominal tenderness.  Neurological:     Mental Status: Mental status is at baseline.     Labs on Admission: I have personally reviewed following labs and imaging studies  CBC: Recent Labs  Lab 04/21/23 1819  WBC 11.5*  NEUTROABS 9.9*  HGB 11.9*  HCT 36.7  MCV 92.0  PLT 183   Basic Metabolic Panel: Recent Labs  Lab 04/21/23 1818  NA 133*  K 5.7*  CL 103  CO2 19*  GLUCOSE 176*  BUN 45*  CREATININE 2.35*  CALCIUM 8.3*   GFR: CrCl cannot be calculated (Unknown ideal weight.). Liver Function Tests: Recent Labs  Lab 04/21/23 1818  AST 29  ALT 18  ALKPHOS 99  BILITOT 1.2  PROT 8.5*  ALBUMIN 3.8   No results for input(s): "LIPASE", "AMYLASE" in the last 168 hours. No results for input(s): "AMMONIA" in the last 168 hours. Coagulation Profile: Recent Labs  Lab 04/21/23 1818  INR 1.2   Cardiac Enzymes: No results for input(s): "CKTOTAL", "CKMB", "CKMBINDEX", "TROPONINI" in the last 168 hours. BNP (last 3 results) No results for input(s): "PROBNP" in the last 8760 hours. HbA1C: No results for input(s): "HGBA1C" in the last 72 hours. CBG: No results for input(s): "GLUCAP" in the last 168 hours. Lipid Profile: No results for input(s): "CHOL", "HDL", "LDLCALC", "TRIG", "CHOLHDL", "LDLDIRECT" in the last 72 hours. Thyroid Function Tests: No results for input(s): "TSH", "T4TOTAL", "FREET4", "T3FREE", "THYROIDAB" in the last 72 hours. Anemia Panel: No results for input(s): "VITAMINB12", "FOLATE", "FERRITIN", "TIBC", "IRON", "RETICCTPCT" in the last 72 hours. Urine analysis:    Component Value Date/Time   COLORURINE YELLOW (A) 04/21/2023 1824   APPEARANCEUR HAZY (A) 04/21/2023 1824   LABSPEC 1.016 04/21/2023 1824   PHURINE 5.0 04/21/2023 1824   GLUCOSEU NEGATIVE 04/21/2023 1824   HGBUR MODERATE (A) 04/21/2023 1824   BILIRUBINUR NEGATIVE  04/21/2023 1824   KETONESUR NEGATIVE 04/21/2023 1824   PROTEINUR >=300 (A) 04/21/2023 1824   NITRITE NEGATIVE 04/21/2023 1824   LEUKOCYTESUR NEGATIVE 04/21/2023 1824    Radiological Exams on Admission: DG Chest 2 View Result Date: 04/21/2023 CLINICAL DATA:  Concern for sepsis. EXAM: CHEST - 2 VIEW COMPARISON:  Chest radiograph dated May 25, 2021. FINDINGS: Low lung volumes. Stable mild cardiomegaly. Aortic atherosclerosis. No focal consolidation, pleural effusion, or pneumothorax. Degenerative changes of the thoracic spine. No  acute osseous abnormality. IMPRESSION: Low lung volumes.  No acute cardiopulmonary findings. Electronically Signed   By: Mannie Seek M.D.   On: 04/21/2023 19:40     Data Reviewed: Relevant notes from primary care and specialist visits, past discharge summaries as available in EHR, including Care Everywhere. Prior diagnostic testing as pertinent to current admission diagnoses Updated medications and problem lists for reconciliation ED course, including vitals, labs, imaging, treatment and response to treatment Triage notes, nursing and pharmacy notes and ED provider's notes Notable results as noted in HPI   Assessment and Plan: Aspiration pneumonia, suspected (HCC) Sepsis Acute respiratory failure with hypoxia Sepsis criteria include fever, tachycardia and tachypnea with hypoxia and AKI Patient was found with increased work of breathing following a choking episode, O2 sats 85% currently on 4 L Strict aspiration precautions Keep n.p.o. pending SLP eval Rocephin and azithromycin Solu-Medrol for possible pneumonitis DuoNebs as needed, racemic epi if needed Continue supplemental oxygen and wean as tolerated   Elevated troponin Suspect secondary to demand ischemia from sepsis and choking episode Continue to trend troponins and serial EKGs Patient does not complain of chest pain Echocardiogram to evaluate for [facility] Consider cardiology consult If troponin  uptrending will start heparin  Hyperkalemia Potassium slightly elevated at 5.7, likely secondary to AKI Calcium gluconate x 1 BMP every 4 and correct as needed  Tachyarrhythmia Patient tachycardic to the 120s to 130s suspect related to sepsis EKG did show flutter, but not suspected Will hydrate and repeat EKG for confirmation Continuous cardiac monitoring  AKI (acute kidney injury) (HCC) Acute metabolic acidosis Secondary to sepsis Expecting improvement with IV fluid resuscitation Continue to monitor renal function and avoid nephrotoxins  Essential hypertension As needed hydralazine while n.p.o.     DVT prophylaxis: Lovenox  Consults: none  Advance Care Planning:   Code Status: Prior   Family Communication: none  Disposition Plan: Back to previous home environment  Severity of Illness: The appropriate patient status for this patient is OBSERVATION. Observation status is judged to be reasonable and necessary in order to provide the required intensity of service to ensure the patient's safety. The patient's presenting symptoms, physical exam findings, and initial radiographic and laboratory data in the context of their medical condition is felt to place them at decreased risk for further clinical deterioration. Furthermore, it is anticipated that the patient will be medically stable for discharge from the hospital within 2 midnights of admission.   Author: Lanetta Pion, MD 04/21/2023 11:00 PM  For on call review [email or web address].

## 2023-04-21 NOTE — Assessment & Plan Note (Signed)
 Patient tachycardic to the 120s to 130s suspect related to sepsis EKG did show flutter, but not suspected Will hydrate and repeat EKG for confirmation Continuous cardiac monitoring

## 2023-04-21 NOTE — Assessment & Plan Note (Addendum)
 Suspect secondary to demand ischemia from sepsis and choking episode Continue to trend troponins and serial EKGs Patient does not complain of chest pain Echocardiogram to evaluate for [facility] Consider cardiology consult If troponin uptrending will start heparin

## 2023-04-21 NOTE — Assessment & Plan Note (Signed)
 Sepsis Acute respiratory failure with hypoxia Sepsis criteria include fever, tachycardia and tachypnea with hypoxia and AKI Patient was found with increased work of breathing following a choking episode, O2 sats 85% currently on 4 L Strict aspiration precautions Keep n.p.o. pending SLP eval Rocephin and azithromycin Solu-Medrol for possible pneumonitis DuoNebs as needed, racemic epi if needed Continue supplemental oxygen and wean as tolerated

## 2023-04-21 NOTE — Assessment & Plan Note (Signed)
 Acute metabolic acidosis Secondary to sepsis Expecting improvement with IV fluid resuscitation Continue to monitor renal function and avoid nephrotoxins

## 2023-04-22 ENCOUNTER — Observation Stay
Admit: 2023-04-22 | Discharge: 2023-04-22 | Disposition: A | Discharge status: Home / Self Care | Attending: Internal Medicine

## 2023-04-22 DIAGNOSIS — A419 Sepsis, unspecified organism: Secondary | ICD-10-CM

## 2023-04-22 DIAGNOSIS — E8721 Acute metabolic acidosis: Secondary | ICD-10-CM | POA: Diagnosis present

## 2023-04-22 DIAGNOSIS — Z1152 Encounter for screening for COVID-19: Secondary | ICD-10-CM | POA: Diagnosis not present

## 2023-04-22 DIAGNOSIS — E875 Hyperkalemia: Secondary | ICD-10-CM | POA: Diagnosis present

## 2023-04-22 DIAGNOSIS — Z9889 Other specified postprocedural states: Secondary | ICD-10-CM | POA: Diagnosis not present

## 2023-04-22 DIAGNOSIS — I452 Bifascicular block: Secondary | ICD-10-CM | POA: Diagnosis present

## 2023-04-22 DIAGNOSIS — R6520 Severe sepsis without septic shock: Secondary | ICD-10-CM | POA: Diagnosis present

## 2023-04-22 DIAGNOSIS — R7989 Other specified abnormal findings of blood chemistry: Secondary | ICD-10-CM

## 2023-04-22 DIAGNOSIS — Z9089 Acquired absence of other organs: Secondary | ICD-10-CM | POA: Diagnosis not present

## 2023-04-22 DIAGNOSIS — J690 Pneumonitis due to inhalation of food and vomit: Secondary | ICD-10-CM | POA: Diagnosis present

## 2023-04-22 DIAGNOSIS — J189 Pneumonia, unspecified organism: Secondary | ICD-10-CM

## 2023-04-22 DIAGNOSIS — J9601 Acute respiratory failure with hypoxia: Secondary | ICD-10-CM

## 2023-04-22 DIAGNOSIS — I4892 Unspecified atrial flutter: Secondary | ICD-10-CM | POA: Diagnosis present

## 2023-04-22 DIAGNOSIS — F17210 Nicotine dependence, cigarettes, uncomplicated: Secondary | ICD-10-CM | POA: Diagnosis present

## 2023-04-22 DIAGNOSIS — Z791 Long term (current) use of non-steroidal anti-inflammatories (NSAID): Secondary | ICD-10-CM | POA: Diagnosis not present

## 2023-04-22 DIAGNOSIS — I10 Essential (primary) hypertension: Secondary | ICD-10-CM | POA: Diagnosis present

## 2023-04-22 DIAGNOSIS — N179 Acute kidney failure, unspecified: Secondary | ICD-10-CM | POA: Diagnosis present

## 2023-04-22 DIAGNOSIS — Z5181 Encounter for therapeutic drug level monitoring: Secondary | ICD-10-CM | POA: Diagnosis not present

## 2023-04-22 LAB — ECHOCARDIOGRAM COMPLETE
AR max vel: 2.46 cm2
AV Area VTI: 2.73 cm2
AV Area mean vel: 2.22 cm2
AV Mean grad: 5 mmHg
AV Peak grad: 8.5 mmHg
Ao pk vel: 1.46 m/s
Area-P 1/2: 3.74 cm2
Est EF: >75
Height: 63 in
MV VTI: 3.57 cm2
S' Lateral: 3.5 cm
Weight: 2480 [oz_av]

## 2023-04-22 LAB — CBC WITH DIFFERENTIAL/PLATELET
Abs Immature Granulocytes: 0.08 10*3/uL — ABNORMAL HIGH (ref 0.00–0.07)
Basophils Absolute: 0 10*3/uL (ref 0.0–0.1)
Basophils Relative: 0 %
Eosinophils Absolute: 0 10*3/uL (ref 0.0–0.5)
Eosinophils Relative: 0 %
HCT: 32.7 % — ABNORMAL LOW (ref 36.0–46.0)
Hemoglobin: 10.6 g/dL — ABNORMAL LOW (ref 12.0–15.0)
Immature Granulocytes: 1 %
Lymphocytes Relative: 9 %
Lymphs Abs: 1.1 10*3/uL (ref 0.7–4.0)
MCH: 29.3 pg (ref 26.0–34.0)
MCHC: 32.4 g/dL (ref 30.0–36.0)
MCV: 90.3 fL (ref 80.0–100.0)
Monocytes Absolute: 1.3 10*3/uL — ABNORMAL HIGH (ref 0.1–1.0)
Monocytes Relative: 11 %
Neutro Abs: 9.1 10*3/uL — ABNORMAL HIGH (ref 1.7–7.7)
Neutrophils Relative %: 79 %
Platelets: 177 10*3/uL (ref 150–400)
RBC: 3.62 MIL/uL — ABNORMAL LOW (ref 3.87–5.11)
RDW: 14.2 % (ref 11.5–15.5)
WBC: 11.6 10*3/uL — ABNORMAL HIGH (ref 4.0–10.5)
nRBC: 0.2 % (ref 0.0–0.2)

## 2023-04-22 LAB — BLOOD CULTURE ID PANEL (REFLEXED) - BCID2

## 2023-04-22 LAB — BASIC METABOLIC PANEL WITH GFR
Anion gap: 9 (ref 5–15)
Anion gap: 7 (ref 5–15)
Anion gap: 10 (ref 5–15)
BUN: 40 mg/dL — ABNORMAL HIGH (ref 8–23)
BUN: 41 mg/dL — ABNORMAL HIGH (ref 8–23)
BUN: 43 mg/dL — ABNORMAL HIGH (ref 8–23)
CO2: 19 mmol/L — ABNORMAL LOW (ref 22–32)
CO2: 20 mmol/L — ABNORMAL LOW (ref 22–32)
CO2: 21 mmol/L — ABNORMAL LOW (ref 22–32)
Calcium: 7.7 mg/dL — ABNORMAL LOW (ref 8.9–10.3)
Calcium: 8 mg/dL — ABNORMAL LOW (ref 8.9–10.3)
Calcium: 8.2 mg/dL — ABNORMAL LOW (ref 8.9–10.3)
Chloride: 106 mmol/L (ref 98–111)
Chloride: 108 mmol/L (ref 98–111)
Chloride: 108 mmol/L (ref 98–111)
Creatinine, Ser: 2.01 mg/dL — ABNORMAL HIGH (ref 0.44–1.00)
Creatinine, Ser: 1.84 mg/dL — ABNORMAL HIGH (ref 0.44–1.00)
Creatinine, Ser: 1.63 mg/dL — ABNORMAL HIGH (ref 0.44–1.00)
GFR, Estimated: 25 mL/min — ABNORMAL LOW (ref >60)
GFR, Estimated: 28 mL/min — ABNORMAL LOW (ref >60)
GFR, Estimated: 32 mL/min — ABNORMAL LOW (ref >60)
Glucose, Bld: 167 mg/dL — ABNORMAL HIGH (ref 70–99)
Glucose, Bld: 190 mg/dL — ABNORMAL HIGH (ref 70–99)
Glucose, Bld: 163 mg/dL — ABNORMAL HIGH (ref 70–99)
Potassium: 6.2 mmol/L — ABNORMAL HIGH (ref 3.5–5.1)
Potassium: 5.9 mmol/L — ABNORMAL HIGH (ref 3.5–5.1)
Potassium: 4.9 mmol/L (ref 3.5–5.1)
Sodium: 134 mmol/L — ABNORMAL LOW (ref 135–145)
Sodium: 135 mmol/L (ref 135–145)
Sodium: 139 mmol/L (ref 135–145)

## 2023-04-22 LAB — TROPONIN I (HIGH SENSITIVITY)
Troponin I (High Sensitivity): 398 ng/L (ref <18)
Troponin I (High Sensitivity): 284 ng/L (ref <18)

## 2023-04-22 LAB — PROTIME-INR
INR: 1.2 (ref 0.8–1.2)
Prothrombin Time: 15.2 s (ref 11.4–15.2)

## 2023-04-22 LAB — CORTISOL-AM, BLOOD: Cortisol - AM: 17.4 ug/dL (ref 6.7–22.6)

## 2023-04-22 LAB — HIV ANTIBODY (ROUTINE TESTING W REFLEX): HIV Screen 4th Generation wRfx: NONREACTIVE

## 2023-04-22 MED ORDER — IRBESARTAN 150 MG PO TABS
300.0000 mg | ORAL_TABLET | Freq: Every day | ORAL | Status: DC
  Administered 2023-04-22 – 2023-04-23 (×2): 300 mg via ORAL
  Filled 2023-04-22 (×2): qty 2

## 2023-04-22 MED ORDER — PRAVASTATIN SODIUM 20 MG PO TABS
40.0000 mg | ORAL_TABLET | Freq: Every day | ORAL | Status: DC
  Administered 2023-04-22 – 2023-04-23 (×2): 40 mg via ORAL
  Filled 2023-04-22 (×2): qty 2

## 2023-04-22 MED ORDER — SODIUM ZIRCONIUM CYCLOSILICATE 10 G PO PACK
10.0000 g | PACK | Freq: Two times a day (BID) | ORAL | Status: AC
  Administered 2023-04-22 (×2): 10 g via ORAL
  Filled 2023-04-22 (×3): qty 1

## 2023-04-22 MED ORDER — TRAMADOL HCL 50 MG PO TABS
50.0000 mg | ORAL_TABLET | Freq: Every day | ORAL | Status: DC
  Administered 2023-04-22 – 2023-04-24 (×3): 50 mg via ORAL
  Filled 2023-04-22 (×3): qty 1

## 2023-04-22 MED ORDER — GABAPENTIN 100 MG PO CAPS
100.0000 mg | ORAL_CAPSULE | Freq: Every day | ORAL | Status: DC
  Administered 2023-04-22 – 2023-04-24 (×3): 100 mg via ORAL
  Filled 2023-04-22 (×3): qty 1

## 2023-04-22 MED ORDER — PERFLUTREN LIPID MICROSPHERE
1.0000 mL | INTRAVENOUS | Status: AC | PRN
  Administered 2023-04-22: 2 mL via INTRAVENOUS

## 2023-04-22 NOTE — Progress Notes (Signed)
 Progress Note   Patient: Danielle Zamora WUJ:[id number] DOB: 08-14-1945 DOA: 04/21/2023     0 DOS: the patient was seen and examined on 04/22/2023   Brief hospital course: Danielle Zamora is a 78 y.o. female with medical history significant for  HTN, chronic hypercalcemia s/p parathyroidectomy(2023), being admitted with sepsis of unknown source (suspect respiratory) and elevated troponin in the 200s.  She was sent from her facility with a 1 day history of chills and congestion, and a reported choking episode while eating popcorn on the day of arrival.  Patient denies choking on popcorn.  She states that the smell of burnt popcorn being prepared for the facility for movie night causes her to have difficulty breathing.  She does endorse having chills from the day prior On arrival of EMS she was noted to have expiratory wheezing and increased work of breathing and treated with  albuterol, racemic epi and Solu-Medrol. ED course and data review: Tmax 103.1 and tachycardic to the 120s.  BP as high as 199/97, improved to 151/93 by admission.  Initial O2 sat 85% for which she was placed on nasal cannula at 4 L. Labs notable for the following: WBC 11,500 With lactic acid 2.3.  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 Chest x-ray nonacute   Treatment administered in the ED Chewable aspirin for elevated troponin Cefepime and vancomycin for possible pneumonia LR boluses for sepsis   Hospitalist consulted for admission.       Assessment and Plan: Aspiration pneumonia, suspected (HCC) Sepsis Acute respiratory failure with hypoxia Sepsis criteria include fever, tachycardia and tachypnea with hypoxia and AKI Currently on 2 L of intranasal  oxygen Continue Rocephin and azithromycin Continue Solu-Medrol for possible pneumonitis DuoNebs as needed, racemic epi if needed Continue supplemental oxygen and wean as tolerated     Elevated troponin Suspect secondary to demand ischemia from sepsis and choking episode Follow-up on echocardiogram We will consider cardiology consult if patient should have chest pain or develop changes on telemetry If troponin uptrending will start heparin   Hyperkalemia Potassium slightly elevated at 5.7, likely secondary to AKI Calcium gluconate x 1 Continue Lokelma Monitor potassium level   Tachyarrhythmia Continue telemetry   AKI (acute kidney injury) (HCC) Acute metabolic acidosis Secondary to sepsis Expecting improvement with IV fluid resuscitation Continue to monitor renal function and avoid nephrotoxins   Essential hypertension Continue home medication   DVT prophylaxis: Lovenox   Consults: none   Advance Care Planning:   Code Status: Full code    Family Communication: none   Disposition Plan: Back to previous home environment  Subjective:  Patient seen and examined at bedside this morning Had some temperature spike noted down Culture results showing 1 out of 4 GPC Upon discussion with ID pharmacist recommends continuation of ceftriaxone and azithromycin Denies worsening chest pain nausea vomiting abdominal pain Currently on 2 L of oxygen Has been seen by speech therapist and cleared to initiate diet  Physical Exam:  Vitals and nursing note reviewed.  Constitutional: Continue intranasal oxygen HENT:     Head: Normocephalic and atraumatic.  Cardiovascular:     Heart sounds: Normal heart sounds.  Pulmonary: .     Breath sounds: Normal breath sounds.  Abdominal:  Palpations: Abdomen is soft.     Tenderness: There is no abdominal tenderness.  Neurological:     Mental Status: Mental status is at baseline.   Vitals:   04/22/23 0800 04/22/23 0900 04/22/23 1000  04/22/23 1113  BP: 135/64 132/69 (!) 151/87   Pulse: 92 82 82   Resp: 15 13 13    Temp:    98.7 F (37.1 C)  TempSrc:    Oral  SpO2: 93% 91% 93%   Weight:      Height:        Data Reviewed:  I have reviewed patient chest x-ray that did not show any acute cardiopulmonary process    Latest Ref Rng & Units 04/21/2023    6:19 PM 12/10/2021    8:05 PM 10/28/2021    8:06 PM  CBC  WBC 4.0 - 10.5 K/uL 11.5  7.9  7.3   Hemoglobin 12.0 - 15.0 g/dL 64.4  03.4  9.1   Hematocrit 36.0 - 46.0 % 36.7  37.7  29.4   Platelets 150 - 400 K/uL 183  368  326        Latest Ref Rng & Units 04/22/2023    3:25 AM 04/21/2023   11:22 PM 04/21/2023    6:18 PM  BMP  Glucose 70 - 99 mg/dL 742  595  638   BUN 8 - 23 mg/dL 41  40  45   Creatinine 0.44 - 1.00 mg/dL 7.56  4.33  2.95   Sodium 135 - 145 mmol/L 135  134  133   Potassium 3.5 - 5.1 mmol/L 5.9  6.2  5.7   Chloride 98 - 111 mmol/L 108  106  103   CO2 22 - 32 mmol/L 20  19  19    Calcium 8.9 - 10.3 mg/dL 8.0  7.7  8.3      Disposition: Status is: Inpatient  Time spent: 55 minutes  Author: Ezzard Holms, MD 04/22/2023 4:24 PM  For on call review [email or web address].

## 2023-04-22 NOTE — Progress Notes (Signed)
 Anticoagulation monitoring(Lovenox):  78 yo  female ordered Lovenox 40 mg Q24h    Filed Weights   04/22/23 0122  Weight: 70.3 kg (155 lb)   BMI 27.5    Lab Results  Component Value Date   CREATININE 2.01 (H) 04/21/2023   CREATININE 2.35 (H) 04/21/2023   CREATININE 0.77 02/10/2022   Estimated Creatinine Clearance: 21.7 mL/min (A) (by C-G formula based on SCr of 2.01 mg/dL (H)). Hemoglobin & Hematocrit     Component Value Date/Time   HGB 11.9 (L) 04/21/2023 1819   HCT 36.7 04/21/2023 1819     Per Protocol for Patient with estCrcl < 30 ml/min and BMI < 30, will transition to Lovenox 30 mg Q24h.

## 2023-04-22 NOTE — ED Notes (Signed)
 RN entering room and RA sats 86%. Danielle Zamora laying on pt's chest. Pt reports unsure how it got there. Carpenter placed back on. Oxygen sats increased to 95%.

## 2023-04-22 NOTE — Progress Notes (Signed)
 OT Cancellation Note  Patient Details Name: Danielle Zamora MRN: [id number] DOB: 03/06/1945   Cancelled Treatment:    Reason Eval/Treat Not Completed: Medical issues which prohibited therapy Consult received and chart reviewed. Patient noted with critically elevated K+, MD contacted and confirmed hold of OT evaluation today, re-attempt as pending medical stability and appropriateness.   George Kinder, MS, OTR/L , CBIS ascom [phone]  04/22/23, 8:24 AM

## 2023-04-22 NOTE — Evaluation (Addendum)
 Occupational Therapy Evaluation Patient Details Name: Danielle Zamora MRN: [id number] DOB: 1945-11-25 Today's Date: 04/22/2023   History of Present Illness   Pt is a 78 y/o F admitted on 04/21/23 after presenting with c/o chills & congestion. Pt is being treated for sepsis, acute respiratory failure with hypoxia. PMH: HTN, chronic hypercalcemia s/p parathyroidectomy (2023), pernicious anemia, cholelithiasis     Clinical Impressions MD cleared pt to work with therapy this afternoon. Upon entering the room, pt seated on EOB with PT present in room. Pt is agreeable to OT evaluation.Pt reports being at [facility] and recently receiving therapy and states she has been learning how to transfer with slideboard.  Pt having been incontinent of urine and needing max A of 2 to stand from elevated surface to remove soiled linens. Pt unable to clear buttocks from bed. +2 assistance to return to supine for safety. Lunch tray set up and all needs within reach upon exiting the room.      If plan is discharge home, recommend the following:   Two people to help with walking and/or transfers;Two people to help with bathing/dressing/bathroom;Assistance with cooking/housework;Assist for transportation;Help with stairs or ramp for entrance     Functional Status Assessment   Patient has had a recent decline in their functional status and demonstrates the ability to make significant improvements in function in a reasonable and predictable amount of time.     Equipment Recommendations   None recommended by OT      Precautions/Restrictions   Precautions Precautions: Fall     Mobility Bed Mobility Overal bed mobility: Needs Assistance Bed Mobility: Supine to Sit, Sit to Supine     Supine to sit: Max assist, HOB elevated, Used rails Sit to supine: Max assist, +2 for physical assistance, +2 for safety/equipment   General bed mobility comments: +2 to scoot to [facility] with bed in trendelenburg position     Transfers Overall transfer level: Needs assistance Equipment used: Rolling walker (2 wheels) Transfers: Sit to/from Stand Sit to Stand: +2 physical assistance, +2 safety/equipment, From elevated surface, Max assist           General transfer comment: Pt transfers STS from elevated stretcher bed with max assist +2 with therapists' blocking BLE to prevent buckling. Pt unable to fully clear buttocks.      Balance Overall balance assessment: Needs assistance Sitting-balance support: Feet supported Sitting balance-Leahy Scale: Fair     Standing balance support: During functional activity, No upper extremity supported, Reliant on assistive device for balance Standing balance-Leahy Scale: Zero                             ADL either performed or assessed with clinical judgement   ADL Overall ADL's : Needs assistance/impaired                                       General ADL Comments: Pt is incontinent of urine and needing assistance for hygiene and linen change.     Vision Patient Visual Report: No change from baseline              Pertinent Vitals/Pain Pain Assessment Pain Assessment: No/denies pain     Extremity/Trunk Assessment Upper Extremity Assessment Upper Extremity Assessment: Generalized weakness   Lower Extremity Assessment Lower Extremity Assessment: Generalized weakness RLE Deficits / Details: 2-/5 knee extension, reports it's her "  arthritis leg"       Communication Communication Communication: No apparent difficulties   Cognition Arousal: Alert Behavior During Therapy: WFL for tasks assessed/performed                                 Following commands: Impaired Following commands impaired: Follows one step commands with increased time     Cueing  General Comments      PT & OT provided total assist for changing saturated bed sheets & gown.           Home Living Family/patient expects to be  discharged to:: Skilled nursing facility                                 Additional Comments: [facility]      Prior Functioning/Environment Prior Level of Function : Needs assist             Mobility Comments: Pt reports she is able to complete bed mobility with hospital bed features without assistance, has been working on slide board transfers to w/c with therapy. ADLs Comments: assist with ADLs from staff    OT Problem List: Decreased strength;Decreased activity tolerance;Decreased safety awareness;Impaired balance (sitting and/or standing);Decreased knowledge of use of DME or AE   OT Treatment/Interventions: Self-care/ADL training;Therapeutic exercise;Therapeutic activities;Energy conservation;DME and/or AE instruction;Patient/family education;Balance training      OT Goals(Current goals can be found in the care plan section)   Acute Rehab OT Goals Patient Stated Goal: to feel better OT Goal Formulation: With patient Time For Goal Achievement: 05/06/23 Potential to Achieve Goals: Fair ADL Goals Pt Will Perform Grooming: with supervision;sitting Pt Will Transfer to Toilet: with max assist Pt/caregiver will Perform Home Exercise Program: With written HEP provided;With Supervision;Both right and left upper extremity;With theraband   OT Frequency:  Min 1X/week    Co-evaluation   Reason for Co-Treatment: For patient/therapist safety PT goals addressed during session: Mobility/safety with mobility;Balance        AM-PAC OT "6 Clicks" Daily Activity     Outcome Measure Help from another person eating meals?: None Help from another person taking care of personal grooming?: A Little Help from another person toileting, which includes using toliet, bedpan, or urinal?: Total Help from another person bathing (including washing, rinsing, drying)?: Total Help from another person to put on and taking off regular upper body clothing?: A Lot Help from another person to  put on and taking off regular lower body clothing?: Total 6 Click Score: 12   End of Session Equipment Utilized During Treatment: Rolling walker (2 wheels)  Activity Tolerance: Patient limited by fatigue Patient left: in bed;with call bell/phone within reach;with bed alarm set  OT Visit Diagnosis: Unsteadiness on feet (R26.81);Muscle weakness (generalized) (M62.81);Other abnormalities of gait and mobility (R26.89)                Time: 1308-6578 OT Time Calculation (min): 14 min Charges:  OT General Charges $OT Visit: 1 Visit OT Evaluation $OT Eval Low Complexity: 1 Low  George Kinder, MS, OTR/L , CBIS ascom [phone]  04/22/23, 3:02 PM

## 2023-04-22 NOTE — Consult Note (Signed)
 PHARMACY - PHYSICIAN COMMUNICATION CRITICAL VALUE ALERT - BLOOD CULTURE IDENTIFICATION (BCID)  Danielle Zamora is an 78 y.o. female who presented to [facility] on 04/21/2023 with a chief complaint of sepsis/respiratory  Assessment:  positive 1 out of 4 bottles. BCID is positive for strep species. Possibly a contaminant.    Name of physician (or Provider) Contacted: Djan  Current antibiotics: Ceftriaxone + azithromycin   Changes to prescribed antibiotics recommended: No changes recommended at this time. If suspect this is not a contaminant, would possibly recommend a longer duration.   Patient is on recommended antibiotics - No changes needed  Results for orders placed or performed during the hospital encounter of 04/21/23  Blood Culture ID Panel (Reflexed) (Collected: 04/21/2023  6:24 PM)  Result Value Ref Range   Enterococcus faecalis NOT DETECTED NOT DETECTED   Enterococcus Faecium NOT DETECTED NOT DETECTED   Listeria monocytogenes NOT DETECTED NOT DETECTED   Staphylococcus species NOT DETECTED NOT DETECTED   Staphylococcus aureus (BCID) NOT DETECTED NOT DETECTED   Staphylococcus epidermidis NOT DETECTED NOT DETECTED   Staphylococcus lugdunensis NOT DETECTED NOT DETECTED   Streptococcus species DETECTED (A) NOT DETECTED   Streptococcus agalactiae NOT DETECTED NOT DETECTED   Streptococcus pneumoniae NOT DETECTED NOT DETECTED   Streptococcus pyogenes NOT DETECTED NOT DETECTED   A.calcoaceticus-baumannii NOT DETECTED NOT DETECTED   Bacteroides fragilis NOT DETECTED NOT DETECTED   Enterobacterales NOT DETECTED NOT DETECTED   Enterobacter cloacae complex NOT DETECTED NOT DETECTED   Escherichia coli NOT DETECTED NOT DETECTED   Klebsiella aerogenes NOT DETECTED NOT DETECTED   Klebsiella oxytoca NOT DETECTED NOT DETECTED   Klebsiella pneumoniae NOT DETECTED NOT DETECTED   Proteus species NOT DETECTED NOT DETECTED   Salmonella species NOT DETECTED NOT DETECTED   Serratia marcescens NOT  DETECTED NOT DETECTED   Haemophilus influenzae NOT DETECTED NOT DETECTED   Neisseria meningitidis NOT DETECTED NOT DETECTED   Pseudomonas aeruginosa NOT DETECTED NOT DETECTED   Stenotrophomonas maltophilia NOT DETECTED NOT DETECTED   Candida albicans NOT DETECTED NOT DETECTED   Candida auris NOT DETECTED NOT DETECTED   Candida glabrata NOT DETECTED NOT DETECTED   Candida krusei NOT DETECTED NOT DETECTED   Candida parapsilosis NOT DETECTED NOT DETECTED   Candida tropicalis NOT DETECTED NOT DETECTED   Cryptococcus neoformans/gattii NOT DETECTED NOT DETECTED    Trinidad Funk 04/22/2023  7:21 AM

## 2023-04-22 NOTE — Progress Notes (Signed)
*  PRELIMINARY RESULTS* Echocardiogram 2D Echocardiogram has been performed.  Danielle Zamora C Imaan Padgett 04/22/2023, 10:26 AM

## 2023-04-22 NOTE — Progress Notes (Signed)
 PT Cancellation Note  Patient Details Name: Danielle Zamora MRN: [id number] DOB: 1945-04-22   Cancelled Treatment:    Reason Eval/Treat Not Completed: Medical issues which prohibited therapy. Consult received and chart reviewed. Patient noted with critically elevated K+, MD contacted and confirmed hold of PT evaluation today, re-attempt as pending medical stability and appropriateness.   Darien Eden PT, DPT 8:20 AM,04/22/23

## 2023-04-22 NOTE — Evaluation (Signed)
 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   Pernicious anemia    Past Surgical History:  Past Surgical History:  Procedure Laterality Date   BIOPSY  05/30/2021   Procedure: BIOPSY;  Surgeon: Daina Drum, MD;  Location: [facility] ENDOSCOPY;  Service: Gastroenterology;;   COLONOSCOPY WITH PROPOFOL N/A 05/30/2021   Procedure: COLONOSCOPY WITH PROPOFOL;  Surgeon: Daina Drum, MD;  Location: [facility] ENDOSCOPY;  Service: Gastroenterology;  Laterality: N/A;   ESOPHAGOGASTRODUODENOSCOPY (EGD) WITH PROPOFOL N/A 05/30/2021   Procedure: ESOPHAGOGASTRODUODENOSCOPY (EGD) WITH PROPOFOL;  Surgeon: Daina Drum, MD;  Location: [facility] ENDOSCOPY;  Service: Gastroenterology;  Laterality: N/A;   IMPACTION REMOVAL  05/30/2021   Procedure: IMPACTION REMOVAL;  Surgeon: Daina Drum, MD;  Location: [facility] ENDOSCOPY;  Service: Gastroenterology;;   PARATHYROIDECTOMY  10/04/2021   POLYPECTOMY  05/30/2021   Procedure: POLYPECTOMY;  Surgeon: Daina Drum, MD;  Location: [facility] ENDOSCOPY;  Service: Gastroenterology;;   HPI:  Danielle Zamora is a 78 y.o. female with medical history significant for  HTN, chronic hypercalcemia s/p parathyroidectomy (2023), being admitted on 04/21/2023 with sepsis of unknown source (suspect respiratory) and elevated troponin in the 200s.  She was sent from her facility with a 1 day history of chills and congestion, and a reported choking episode while eating popcorn on the day of arrival.  Patient denies choking on popcorn.  On arrival of EMS she was noted to have expiratory wheezing and increased work of  breathing and treated with  albuterol, racemic epi and Solu-Medrol. Tmax 103.1 and tachycardic to the 120s.  BP as high as 199/97, improved to 151/93 by admission.  Initial O2 sat 85% for which she was placed on nasal cannula at 4 L. Negative for COVID, flu and RSV. DG Chest on 04/21/2023 revealed the following: Low lung volumes. Stable mild cardiomegaly. Aortic atherosclerosis. No focal consolidation, pleural effusion, or pneumothorax. Degenerative changes of the thoracic spine. No acute osseous  abnormality.    Assessment / Plan / Recommendation  Clinical Impression  Pt presents with adequate orpharyngeal abilities when consuming regular, puree and thin liquids via straw. She demosntrates slightly increased oral prep when consuming crackers supect d/t only having upper dentures in place (pt is edentulous lower d/t not having "those teeth" with her. At this time, discussed mechanical soft diet for ease of chewing while hospitalized. Pt was agreeable. No further ST services are indicated at this time. SLP Visit Diagnosis: Dysphagia, unspecified (R13.10)    Aspiration Risk  Mild aspiration risk    Diet Recommendation Dysphagia 3 (Mech soft);Thin liquid    Liquid Administration via: Cup;Straw Medication Administration: Whole meds with liquid Supervision: Patient able to self feed Compensations: Minimize environmental distractions;Slow rate;Small sips/bites Postural Changes: Seated upright at 90 degrees;Remain upright for at least 30 minutes after po intake    Other  Recommendations Oral Care Recommendations: Oral care BID    Recommendations for follow up therapy are one component of a multi-disciplinary discharge planning process, led by the attending physician.  Recommendations may be updated based on patient status, additional functional criteria and insurance authorization.  Follow up Recommendations No SLP follow up  Assistance Recommended at Discharge  N/A  Functional Status  Assessment Patient has not had a recent decline in their functional status  Frequency and Duration   N/A         Prognosis   N/A     Swallow Study   General Date of Onset: 04/21/23 HPI: Danielle Zamora is a 78 y.o. female with medical history significant for  HTN, chronic hypercalcemia s/p parathyroidectomy (2023), being admitted on 04/21/2023 with sepsis of unknown source (suspect respiratory) and elevated troponin in the 200s.  She was sent from her facility with a 1 day history of chills and congestion, and a reported choking episode while eating popcorn on the day of arrival.  Patient denies choking on popcorn.  On arrival of EMS she was noted to have expiratory wheezing and increased work of breathing and treated with  albuterol, racemic epi and Solu-Medrol. Tmax 103.1 and tachycardic to the 120s.  BP as high as 199/97, improved to 151/93 by admission.  Initial O2 sat 85% for which she was placed on nasal cannula at 4 L. Negative for COVID, flu and RSV. DG Chest on 04/21/2023 revealed the following: Low lung volumes. Stable mild cardiomegaly. Aortic atherosclerosis. No focal consolidation, pleural effusion, or pneumothorax. Degenerative changes of the thoracic spine. No acute osseous  abnormality. Type of Study: Bedside Swallow Evaluation Previous Swallow Assessment: none in chart Diet Prior to this Study: NPO Temperature Spikes Noted: Yes Respiratory Status: Nasal cannula (2Liters) History of Recent Intubation: No Behavior/Cognition: Alert;Cooperative;Pleasant mood Oral Cavity Assessment: Within Functional Limits Oral Cavity - Dentition: Dentures, top;Edentulous Vision: Functional for self-feeding Self-Feeding Abilities: Needs assist Patient Positioning: Upright in bed Baseline Vocal Quality: Normal Volitional Cough: Strong Volitional Swallow: Able to elicit    Oral/Motor/Sensory Function Overall Oral Motor/Sensory Function: Within functional limits   Ice Chips Ice chips: Not tested    Thin Liquid Thin Liquid: Within functional limits Presentation: Straw    Nectar Thick Nectar Thick Liquid: Not tested   Honey Thick Honey Thick Liquid: Not tested   Puree Puree: Within functional limits Presentation: Spoon   Solid     Solid: Within functional limits Presentation: Self Fed     Kaisley Stiverson B. Garlin Junker, M.S., CCC-SLP, Tree surgeon Certified Brain Injury Specialist [facility]  [facility] Office [phone] Ascom [phone] Fax [phone]

## 2023-04-22 NOTE — Evaluation (Addendum)
 Physical Therapy Evaluation Patient Details Name: Danielle Zamora MRN: [id number] DOB: 02-12-1945 Today's Date: 04/22/2023  History of Present Illness  Pt is a 78 y/o F admitted on 04/21/23 after presenting with c/o chills & congestion. Pt is being treated for sepsis, acute respiratory failure with hypoxia. PMH: HTN, chronic hypercalcemia s/p parathyroidectomy (2023), pernicious anemia, cholelithiasis  Clinical Impression  MD cleared pt for PT this PM (via secure chat). Pt seen for PT evaluation with pt agreeable; co-tx with OT for pt & therapists' safety. Pt reports prior to admission she was in [facility]TC, working on slide board transfers to w/c. On this date, pt requires max +1-2 assist for supine>sit with HOB fully elevated. Pt attempts STS x 2 from elevated stretcher to allow therapists' to change out soiled linens but pt with poor ability to clear buttocks, requires heavy blocking at R knee to prevent buckling. Pt assisted back to bed & set up with meal tray. Will continue to follow pt acutely to progress mobility as able.  Pt on 2L/min via nasal cannula.        If plan is discharge home, recommend the following: Two people to help with walking and/or transfers;Two people to help with bathing/dressing/bathroom;Assistance with cooking/housework   Can travel by private vehicle   No    Equipment Recommendations None recommended by PT  Recommendations for Other Services       Functional Status Assessment Patient has had a recent decline in their functional status and demonstrates the ability to make significant improvements in function in a reasonable and predictable amount of time.     Precautions / Restrictions Precautions Precautions: Fall Restrictions Weight Bearing Restrictions Per Provider Order: No      Mobility  Bed Mobility Overal bed mobility: Needs Assistance Bed Mobility: Supine to Sit, Sit to Supine     Supine to sit: Max assist, HOB elevated, Used rails (head of  stretcher fully elevated) Sit to supine: Max assist, +2 for physical assistance, +2 for safety/equipment   General bed mobility comments: +2 to scoot to [facility] with bed in trendelenburg position    Transfers Overall transfer level: Needs assistance Equipment used: Rolling walker (2 wheels) Transfers: Sit to/from Stand Sit to Stand: +2 physical assistance, +2 safety/equipment, From elevated surface, Max assist           General transfer comment: Pt transfers STS from elevated stretcher bed with max assist +2 with therapists' blocking BLE to prevent buckling. Pt unable to fully clear buttocks.    Ambulation/Gait                  Stairs            Wheelchair Mobility     Tilt Bed    Modified Rankin (Stroke Patients Only)       Balance Overall balance assessment: Needs assistance Sitting-balance support: Feet supported Sitting balance-Leahy Scale: Fair     Standing balance support: During functional activity, No upper extremity supported, Reliant on assistive device for balance Standing balance-Leahy Scale: Zero                               Pertinent Vitals/Pain Pain Assessment Pain Assessment: No/denies pain    Home Living Family/patient expects to be discharged to:: Skilled nursing facility                   Additional Comments: [facility]    Prior Function Prior Level  of Function : Needs assist             Mobility Comments: Pt reports she is able to complete bed mobility with hospital bed features without assistance, has been working on slide board transfers to w/c with therapy.       Extremity/Trunk Assessment   Upper Extremity Assessment Upper Extremity Assessment: Generalized weakness    Lower Extremity Assessment Lower Extremity Assessment: Generalized weakness;RLE deficits/detail RLE Deficits / Details: 2-/5 knee extension, reports it's her "arthritis leg"       Communication   Communication Communication:  No apparent difficulties    Cognition Arousal: Alert Behavior During Therapy: WFL for tasks assessed/performed                           PT - Cognition Comments: Pt lying in bed saturated with urine, reports she's aware. Following commands: Impaired Following commands impaired: Follows one step commands with increased time     Cueing       General Comments General comments (skin integrity, edema, etc.): PT & OT provided total assist for changing saturated bed sheets & gown.    Exercises     Assessment/Plan    PT Assessment Patient needs continued PT services  PT Problem List Decreased strength;Cardiopulmonary status limiting activity;Decreased range of motion;Decreased activity tolerance;Decreased balance;Decreased mobility;Decreased knowledge of use of DME       PT Treatment Interventions DME instruction;Balance training;Neuromuscular re-education;Gait training;Modalities;Stair training;Functional mobility training;Therapeutic activities;Therapeutic exercise;Manual techniques;Patient/family education    PT Goals (Current goals can be found in the Care Plan section)  Acute Rehab PT Goals Patient Stated Goal: none stated PT Goal Formulation: With patient Time For Goal Achievement: 05/06/23 Potential to Achieve Goals: Fair    Frequency Min 1X/week     Co-evaluation PT/OT/SLP Co-Evaluation/Treatment: Yes Reason for Co-Treatment: For patient/therapist safety PT goals addressed during session: Mobility/safety with mobility;Balance         AM-PAC PT "6 Clicks" Mobility  Outcome Measure Help needed turning from your back to your side while in a flat bed without using bedrails?: Total Help needed moving from lying on your back to sitting on the side of a flat bed without using bedrails?: Total Help needed moving to and from a bed to a chair (including a wheelchair)?: Total Help needed standing up from a chair using your arms (e.g., wheelchair or bedside  chair)?: Total Help needed to walk in hospital room?: Total Help needed climbing 3-5 steps with a railing? : Total 6 Click Score: 6    End of Session Equipment Utilized During Treatment: Oxygen Activity Tolerance: Patient tolerated treatment well Patient left: in bed;with call bell/phone within reach;with bed alarm set (set up with meal tray)   PT Visit Diagnosis: Muscle weakness (generalized) (M62.81);Other abnormalities of gait and mobility (R26.89);Difficulty in walking, not elsewhere classified (R26.2)    Time: 6962-9528 PT Time Calculation (min) (ACUTE ONLY): 22 min   Charges:   PT Evaluation $PT Eval Low Complexity: 1 Low   PT General Charges $$ ACUTE PT VISIT: 1 Visit         Emaline Handsome, PT, DPT 04/22/23, 2:41 PM   Venetta Gill 04/22/2023, 2:39 PM

## 2023-04-23 DIAGNOSIS — J189 Pneumonia, unspecified organism: Secondary | ICD-10-CM | POA: Diagnosis not present

## 2023-04-23 LAB — CBC WITH DIFFERENTIAL/PLATELET
Abs Immature Granulocytes: 0.1 10*3/uL — ABNORMAL HIGH (ref 0.00–0.07)
Basophils Absolute: 0 10*3/uL (ref 0.0–0.1)
Basophils Relative: 0 %
Eosinophils Absolute: 0.1 10*3/uL (ref 0.0–0.5)
Eosinophils Relative: 1 %
HCT: 31.5 % — ABNORMAL LOW (ref 36.0–46.0)
Hemoglobin: 10.2 g/dL — ABNORMAL LOW (ref 12.0–15.0)
Immature Granulocytes: 1 %
Lymphocytes Relative: 17 %
Lymphs Abs: 1.6 10*3/uL (ref 0.7–4.0)
MCH: 29.3 pg (ref 26.0–34.0)
MCHC: 32.4 g/dL (ref 30.0–36.0)
MCV: 90.5 fL (ref 80.0–100.0)
Monocytes Absolute: 1 10*3/uL (ref 0.1–1.0)
Monocytes Relative: 11 %
Neutro Abs: 6.6 10*3/uL (ref 1.7–7.7)
Neutrophils Relative %: 70 %
Platelets: 161 10*3/uL (ref 150–400)
RBC: 3.48 MIL/uL — ABNORMAL LOW (ref 3.87–5.11)
RDW: 14.3 % (ref 11.5–15.5)
WBC: 9.4 10*3/uL (ref 4.0–10.5)
nRBC: 0 % (ref 0.0–0.2)

## 2023-04-23 LAB — BASIC METABOLIC PANEL WITH GFR
Anion gap: 9 (ref 5–15)
BUN: 40 mg/dL — ABNORMAL HIGH (ref 8–23)
CO2: 20 mmol/L — ABNORMAL LOW (ref 22–32)
Calcium: 8.1 mg/dL — ABNORMAL LOW (ref 8.9–10.3)
Chloride: 110 mmol/L (ref 98–111)
Creatinine, Ser: 1.44 mg/dL — ABNORMAL HIGH (ref 0.44–1.00)
GFR, Estimated: 37 mL/min — ABNORMAL LOW (ref >60)
Glucose, Bld: 108 mg/dL — ABNORMAL HIGH (ref 70–99)
Potassium: 4.6 mmol/L (ref 3.5–5.1)
Sodium: 139 mmol/L (ref 135–145)

## 2023-04-23 MED ORDER — ADULT MULTIVITAMIN W/MINERALS CH
1.0000 | ORAL_TABLET | Freq: Every day | ORAL | Status: DC
  Administered 2023-04-23 – 2023-04-24 (×2): 1 via ORAL
  Filled 2023-04-23 (×2): qty 1

## 2023-04-23 MED ORDER — FAMOTIDINE 20 MG PO TABS
20.0000 mg | ORAL_TABLET | Freq: Every day | ORAL | Status: DC
  Administered 2023-04-23 – 2023-04-24 (×2): 20 mg via ORAL
  Filled 2023-04-23 (×2): qty 1

## 2023-04-23 MED ORDER — ENSURE ENLIVE PO LIQD
237.0000 mL | Freq: Two times a day (BID) | ORAL | Status: DC
  Administered 2023-04-23 – 2023-04-24 (×2): 237 mL via ORAL

## 2023-04-23 MED ORDER — POLYETHYLENE GLYCOL 3350 17 G PO PACK
17.0000 g | PACK | Freq: Every day | ORAL | Status: DC
  Administered 2023-04-23: 17 g via ORAL
  Filled 2023-04-23 (×2): qty 1

## 2023-04-23 MED ORDER — AZITHROMYCIN 250 MG PO TABS
500.0000 mg | ORAL_TABLET | Freq: Every day | ORAL | Status: DC
  Administered 2023-04-23: 500 mg via ORAL
  Filled 2023-04-23: qty 2

## 2023-04-23 NOTE — Plan of Care (Signed)
   Problem: Fluid Volume: Goal: Hemodynamic stability will improve Outcome: Progressing   Problem: Clinical Measurements: Goal: Diagnostic test results will improve Outcome: Progressing Goal: Signs and symptoms of infection will decrease Outcome: Progressing   Problem: Respiratory: Goal: Ability to maintain adequate ventilation will improve Outcome: Progressing   Problem: Education: Goal: Knowledge of General Education information will improve Description: Including pain rating scale, medication(s)/side effects and non-pharmacologic comfort measures Outcome: Progressing   Problem: Health Behavior/Discharge Planning: Goal: Ability to manage health-related needs will improve Outcome: Progressing   Problem: Clinical Measurements: Goal: Ability to maintain clinical measurements within normal limits will improve Outcome: Progressing Goal: Will remain free from infection Outcome: Progressing Goal: Diagnostic test results will improve Outcome: Progressing Goal: Respiratory complications will improve Outcome: Progressing Goal: Cardiovascular complication will be avoided Outcome: Progressing   Problem: Activity: Goal: Risk for activity intolerance will decrease Outcome: Progressing   Problem: Nutrition: Goal: Adequate nutrition will be maintained Outcome: Progressing   Problem: Coping: Goal: Level of anxiety will decrease Outcome: Progressing   Problem: Elimination: Goal: Will not experience complications related to bowel motility Outcome: Progressing Goal: Will not experience complications related to urinary retention Outcome: Progressing   Problem: Pain Managment: Goal: General experience of comfort will improve and/or be controlled Outcome: Progressing   Problem: Safety: Goal: Ability to remain free from injury will improve Outcome: Progressing   Problem: Skin Integrity: Goal: Risk for impaired skin integrity will decrease Outcome: Progressing   Problem:  Education: Goal: Knowledge of disease or condition will improve Outcome: Progressing Goal: Knowledge of the prescribed therapeutic regimen will improve Outcome: Progressing Goal: Individualized Educational Video(s) Outcome: Progressing   Problem: Activity: Goal: Ability to tolerate increased activity will improve Outcome: Progressing Goal: Will verbalize the importance of balancing activity with adequate rest periods Outcome: Progressing   Problem: Respiratory: Goal: Ability to maintain a clear airway will improve Outcome: Progressing Goal: Levels of oxygenation will improve Outcome: Progressing Goal: Ability to maintain adequate ventilation will improve Outcome: Progressing

## 2023-04-23 NOTE — Progress Notes (Signed)
 Initial Nutrition Assessment  DOCUMENTATION CODES:   Not applicable  INTERVENTION:  Ensure Enlive po BID, each supplement provides 350 kcal and 20 grams of protein.  MVI with minerals.   Encourage PO intake   NUTRITION DIAGNOSIS:   Increased nutrient needs related to acute illness as evidenced by estimated needs.  GOAL:   Patient will meet greater than or equal to 90% of their needs  MONITOR:   PO intake, Supplement acceptance, Weight trends  REASON FOR ASSESSMENT:   Consult Assessment of nutrition requirement/status  ASSESSMENT:   PMH: HTN, chronic hypercalcemia s/p parathyroidectomy(2023), being admitted with sepsis of unknown source (suspect respiratory).  SLP recommended DYS 3/thins.  Intake: Nursing flowsheets document meal completions between 90%.  Medications reviewed and include: pepcid, prednisone, azithromycin  Labs reviewed.   Intake/Output Summary (Last 24 hours) at 04/23/2023 1218 Last data filed at 04/23/2023 1046 Gross per 24 hour  Intake 1140 ml  Output 650 ml  Net 490 ml    Weights reviewed. Admit weight 70.3 kg. Weight loss of 18 kg since Dec '24 noted in EMR.    NUTRITION - FOCUSED PHYSICAL EXAM:  Defer to f/u.   Diet Order:   Diet Order             DIET DYS 3 Room service appropriate? Yes; Fluid consistency: Thin  Diet effective now                   EDUCATION NEEDS:   Not appropriate for education at this time  Skin:  Skin Assessment: Reviewed RN Assessment  Last BM:  4/11  Height:   Ht Readings from Last 1 Encounters:  04/22/23 5\' 3"  (1.6 m)    Weight:   Wt Readings from Last 1 Encounters:  04/22/23 70.3 kg    BMI:  Body mass index is 27.46 kg/m.  Estimated Nutritional Needs:   Kcal:  1800-2100  Protein:  90-110  Fluid:  >1.8 L of per MD  Laren Player, MPH, RD, LDN Clinical Dietitian Contact information can be found at [facility]

## 2023-04-23 NOTE — Progress Notes (Signed)
 Progress Note   Patient: Danielle Zamora ZOX:[id number] DOB: 15-Mar-1945 DOA: 04/21/2023     1 DOS: the patient was seen and examined on 04/23/2023     Brief hospital course: Lilyona Richner is a 78 y.o. female with medical history significant for  HTN, chronic hypercalcemia s/p parathyroidectomy(2023), being admitted with sepsis of unknown source (suspect respiratory) and elevated troponin in the 200s.  She was sent from her facility with a 1 day history of chills and congestion, and a reported choking episode while eating popcorn on the day of arrival.  Patient denies choking on popcorn.  She states that the smell of burnt popcorn being prepared for the facility for movie night causes her to have difficulty breathing.  She does endorse having chills from the day prior On arrival of EMS she was noted to have expiratory wheezing and increased work of breathing and treated with  albuterol, racemic epi and Solu-Medrol. ED course and data review: Tmax 103.1 and tachycardic to the 120s.  BP as high as 199/97, improved to 151/93 by admission.  Initial O2 sat 85% for which she was placed on nasal cannula at 4 L. Labs notable for the following: WBC 11,500 With lactic acid 2.3.  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 Chest x-ray nonacute   Treatment administered in the ED Chewable aspirin for elevated troponin Cefepime and vancomycin for possible pneumonia LR boluses for sepsis   Hospitalist consulted for admission.        Assessment and Plan: Aspiration pneumonia, suspected (HCC) Sepsis Acute respiratory failure with hypoxia Sepsis criteria include fever, tachycardia and tachypnea with hypoxia and AKI Currently on 2 L of intranasal  oxygen Continue Rocephin and azithromycin Solu-Medrol has been transitioned to prednisone to cover possible pneumonitis DuoNebs as needed, racemic epi if needed Continue supplemental oxygen and wean as tolerated     Elevated troponin Suspect secondary to demand ischemia from sepsis and choking episode Follow-up on echocardiogram We will consider cardiology consult if patient should have chest pain or develop changes on telemetry If troponin uptrending will start heparin   Hyperkalemia improved Potassium slightly elevated at 5.7, likely secondary to AKI Calcium gluconate x 1 Have improved with Lokelma Monitor potassium level   Tachyarrhythmia Continue telemetry   AKI (acute kidney injury) (HCC) Acute metabolic acidosis Secondary to sepsis Expecting improvement with IV fluid resuscitation Continue to monitor renal function  Continue to avoid nephrotoxins   Essential hypertension Continue home medication   DVT prophylaxis: Lovenox   Consults: none   Advance Care Planning:   Code Status: Full code     Family Communication: none   Disposition Plan: Back to previous home environment   Subjective:  Patient seen and examined at bedside this morning Renal function continues to improve Patient has been seen by PT OT with recommendation for skilled nursing facility Oxygen requirement continues to improve currently on 2 L   Physical Exam:   Vitals and nursing note reviewed.  Constitutional: Continue intranasal oxygen HENT:     Head: Normocephalic and atraumatic.  Cardiovascular:     Heart sounds: Normal heart sounds.  Pulmonary: .     Breath sounds: Normal breath sounds.  Abdominal:     Palpations: Abdomen is soft.  Tenderness: There is no abdominal tenderness.  Neurological:     Mental Status: Mental status is at baseline.      Data Reviewed: Patient's chest imaging reviewed  Vitals:   04/22/23 1900 04/22/23 1955 04/23/23 0413 04/23/23 0755  BP: (!)  152/94 (!) 141/68 124/66 (!) 156/76  Pulse: 93 88 78 80  Resp: 13 20 20 18   Temp:  98.2 F (36.8 C) 98.2 F (36.8 C) 98.2 F (36.8 C)  TempSrc:  Oral Oral   SpO2: 94% 100% 99% 93%  Weight:      Height:          Latest Ref Rng & Units 04/23/2023    5:55 AM 04/22/2023    8:18 PM 04/21/2023    6:19 PM  CBC  WBC 4.0 - 10.5 K/uL 9.4  11.6  11.5   Hemoglobin 12.0 - 15.0 g/dL 78.4  69.6  29.5   Hematocrit 36.0 - 46.0 % 31.5  32.7  36.7   Platelets 150 - 400 K/uL 161  177  183        Latest Ref Rng & Units 04/23/2023    5:55 AM 04/22/2023    8:18 PM 04/22/2023    3:25 AM  BMP  Glucose 70 - 99 mg/dL 284  132  440   BUN 8 - 23 mg/dL 40  43  41   Creatinine 0.44 - 1.00 mg/dL 1.02  7.25  3.66   Sodium 135 - 145 mmol/L 139  139  135   Potassium 3.5 - 5.1 mmol/L 4.6  4.9  5.9   Chloride 98 - 111 mmol/L 110  108  108   CO2 22 - 32 mmol/L 20  21  20    Calcium 8.9 - 10.3 mg/dL 8.1  8.2  8.0      Author: Ezzard Holms, MD 04/23/2023 3:31 PM  For on call review [email or web address].

## 2023-04-24 DIAGNOSIS — N179 Acute kidney failure, unspecified: Secondary | ICD-10-CM | POA: Diagnosis not present

## 2023-04-24 LAB — CBC WITH DIFFERENTIAL/PLATELET
Abs Immature Granulocytes: 0.51 10*3/uL — ABNORMAL HIGH (ref 0.00–0.07)
Basophils Absolute: 0.1 10*3/uL (ref 0.0–0.1)
Basophils Relative: 1 %
Eosinophils Absolute: 0 10*3/uL (ref 0.0–0.5)
Eosinophils Relative: 0 %
HCT: 35.5 % — ABNORMAL LOW (ref 36.0–46.0)
Hemoglobin: 11.4 g/dL — ABNORMAL LOW (ref 12.0–15.0)
Immature Granulocytes: 4 %
Lymphocytes Relative: 13 %
Lymphs Abs: 1.6 10*3/uL (ref 0.7–4.0)
MCH: 28.9 pg (ref 26.0–34.0)
MCHC: 32.1 g/dL (ref 30.0–36.0)
MCV: 90.1 fL (ref 80.0–100.0)
Monocytes Absolute: 1 10*3/uL (ref 0.1–1.0)
Monocytes Relative: 8 %
Neutro Abs: 9.3 10*3/uL — ABNORMAL HIGH (ref 1.7–7.7)
Neutrophils Relative %: 74 %
Platelets: 183 10*3/uL (ref 150–400)
RBC: 3.94 MIL/uL (ref 3.87–5.11)
RDW: 14.3 % (ref 11.5–15.5)
WBC: 12.5 10*3/uL — ABNORMAL HIGH (ref 4.0–10.5)
nRBC: 0.2 % (ref 0.0–0.2)

## 2023-04-24 LAB — BASIC METABOLIC PANEL WITH GFR
Anion gap: 9 (ref 5–15)
BUN: 37 mg/dL — ABNORMAL HIGH (ref 8–23)
CO2: 22 mmol/L (ref 22–32)
Calcium: 8.6 mg/dL — ABNORMAL LOW (ref 8.9–10.3)
Chloride: 110 mmol/L (ref 98–111)
Creatinine, Ser: 1.34 mg/dL — ABNORMAL HIGH (ref 0.44–1.00)
GFR, Estimated: 41 mL/min — ABNORMAL LOW (ref >60)
Glucose, Bld: 107 mg/dL — ABNORMAL HIGH (ref 70–99)
Potassium: 5 mmol/L (ref 3.5–5.1)
Sodium: 141 mmol/L (ref 135–145)

## 2023-04-24 LAB — CULTURE, BLOOD (ROUTINE X 2): Special Requests: ADEQUATE

## 2023-04-24 MED ORDER — CEPHALEXIN 500 MG PO CAPS: 500.0000 mg | ORAL_CAPSULE | Freq: Two times a day (BID) | ORAL | Status: AC

## 2023-04-24 MED ORDER — CEPHALEXIN 500 MG PO CAPS: 500.0000 mg | ORAL_CAPSULE | Freq: Two times a day (BID) | ORAL | Status: DC

## 2023-04-24 MED ORDER — ENOXAPARIN SODIUM 40 MG/0.4ML IJ SOSY
40.0000 mg | PREFILLED_SYRINGE | INTRAMUSCULAR | Status: DC
Start: 2023-04-25 — End: 2023-04-24

## 2023-04-24 MED ORDER — POLYETHYLENE GLYCOL 3350 17 G PO PACK: 17.0000 g | PACK | Freq: Every day | ORAL | Status: AC

## 2023-04-24 MED ORDER — FAMOTIDINE 20 MG PO TABS: 20.0000 mg | ORAL_TABLET | Freq: Every day | ORAL | Status: AC

## 2023-04-24 MED ORDER — ONDANSETRON HCL 4 MG PO TABS: 4.0000 mg | ORAL_TABLET | Freq: Four times a day (QID) | ORAL | Status: AC | PRN

## 2023-04-24 MED ORDER — TRAMADOL HCL 50 MG PO TABS: 50.0000 mg | ORAL_TABLET | Freq: Four times a day (QID) | ORAL | 0 refills | Status: AC | PRN

## 2023-04-24 NOTE — TOC Initial Note (Signed)
 Transition of Care [facility]) - Initial/Assessment Note    Patient Details  Name: Danielle Zamora MRN: [id number] Date of Birth: Mar 21, 1945  Transition of Care [facility]) CM/SW Contact:    Odilia Bennett, LCSW Phone Number: 04/24/2023, 11:52 AM  Clinical Narrative:  CSW met with patient. No family at bedside. CSW introduced role and explained that discharge planning would be discussed. Patient confirmed that she is a long-term resident at [facility]. She has been getting therapy there. SNF admissions coordinator confirmed they can accept her back today. MD is aware.              Expected Discharge Plan: Skilled Nursing Facility Barriers to Discharge: No Barriers Identified   Patient Goals and CMS Choice     Choice offered to / list presented to : Patient      Expected Discharge Plan and Services     Post Acute Care Choice: Resumption of Svcs/PTA Provider Living arrangements for the past 2 months: Skilled Nursing Facility                                      Prior Living Arrangements/Services Living arrangements for the past 2 months: Skilled Nursing Facility Lives with:: Facility Resident Patient language and need for interpreter reviewed:: Yes Do you feel safe going back to the place where you live?: Yes      Need for Family Participation in Patient Care: Yes (Comment) Care giver support system in place?: Yes (comment)   Criminal Activity/Legal Involvement Pertinent to Current Situation/Hospitalization: No - Comment as needed  Activities of Daily Living   ADL Screening (condition at time of admission) Independently performs ADLs?: No Does the patient have a NEW difficulty with bathing/dressing/toileting/self-feeding that is expected to last >3 days?: No Does the patient have a NEW difficulty with getting in/out of bed, walking, or climbing stairs that is expected to last >3 days?: No Does the patient have a NEW difficulty with communication that is expected to  last >3 days?: No Is the patient deaf or have difficulty hearing?: No Does the patient have difficulty seeing, even when wearing glasses/contacts?: No Does the patient have difficulty concentrating, remembering, or making decisions?: No  Permission Sought/Granted Permission sought to share information with : Facility Industrial/product designer granted to share information with : Yes, Verbal Permission Granted     Permission granted to share info w AGENCY: [facility]        Emotional Assessment Appearance:: Appears stated age Attitude/Demeanor/Rapport: Engaged, Gracious Affect (typically observed): Accepting, Appropriate, Calm, Pleasant Orientation: : Oriented to Self, Oriented to Place, Oriented to  Time, Oriented to Situation Alcohol / Substance Use: Not Applicable Psych Involvement: No (comment)  Admission diagnosis:  Lactic acidosis [E87.20] AKI (acute kidney injury) (HCC) [N17.9] Sepsis (HCC) [A41.9] Fever, unspecified fever cause [R50.9] Patient Active Problem List   Diagnosis Date Noted   Sepsis (HCC) 04/21/2023   Elevated troponin 04/21/2023   Aspiration pneumonia, suspected (HCC) 04/21/2023   Acute respiratory failure with hypoxia (HCC) 04/21/2023   AKI (acute kidney injury) (HCC) 04/21/2023   Acute metabolic acidosis 04/21/2023   Tachyarrhythmia 04/21/2023   Rash and nonspecific skin eruption 02/10/2022   Recurrent falls 10/08/2021   Chronic pain of both knees 10/08/2021   Elevated bilirubin    Elevated LFTs 05/24/2021   Anemia due to chronic kidney disease 05/11/2021   Hyperkalemia 05/11/2021   Aortic atherosclerosis (HCC) 02/24/2021  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 [place] [address] Phone: [phone] Fax: [phone]     Social Drivers of Health (SDOH) Social History: SDOH Screenings   Food Insecurity: No Food Insecurity (04/22/2023)  Housing: Low Risk  (04/22/2023)  Transportation Needs: No Transportation Needs (04/22/2023)  Utilities: Not At Risk (04/22/2023)  Alcohol Screen: Low Risk  (01/14/2019)  Depression (PHQ2-9): Low Risk  (03/14/2022)  Financial Resource Strain: Low Risk  (01/14/2019)  Physical Activity: Inactive (01/14/2019)  Social Connections: Moderately Integrated (04/22/2023)  Stress: No Stress Concern Present (01/14/2019)  Tobacco Use: High Risk (04/21/2023)   SDOH Interventions:     Readmission Risk Interventions     No data to display

## 2023-04-24 NOTE — TOC Transition Note (Signed)
 Transition of Care [facility]) - Discharge Note   Patient Details  Name: Danielle Zamora MRN: [id number] Date of Birth: 1945/04/10  Transition of Care [facility]) CM/SW Contact:  Odilia Bennett, LCSW Phone Number: 04/24/2023, 2:35 PM   Clinical Narrative:  Patient has orders to discharge back to [facility] today. RN will call report to [phone] (Room B-11). [facility] has been arranged and she is 4th on the list. No further concerns. CSW signing off.   Final next level of care: Skilled Nursing Facility Barriers to Discharge: Barriers Resolved   Patient Goals and CMS Choice     Choice offered to / list presented to : Patient      Discharge Placement   Existing PASRR number confirmed : 04/24/23          Patient chooses bed at: Other - please specify in the comment section below: ([facility]) Patient to be transferred to facility by: [facility] Name of family member notified: Patient has been keeping family updated. Patient and family notified of of transfer: 04/24/23  Discharge Plan and Services Additional resources added to the After Visit Summary for       Post Acute Care Choice: Resumption of Svcs/PTA Provider                               Social Drivers of Health (SDOH) Interventions SDOH Screenings   Food Insecurity: No Food Insecurity (04/22/2023)  Housing: Low Risk  (04/22/2023)  Transportation Needs: No Transportation Needs (04/22/2023)  Utilities: Not At Risk (04/22/2023)  Alcohol Screen: Low Risk  (01/14/2019)  Depression (PHQ2-9): Low Risk  (03/14/2022)  Financial Resource Strain: Low Risk  (01/14/2019)  Physical Activity: Inactive (01/14/2019)  Social Connections: Moderately Integrated (04/22/2023)  Stress: No Stress Concern Present (01/14/2019)  Tobacco Use: High Risk (04/21/2023)     Readmission Risk Interventions     No data to display

## 2023-04-24 NOTE — Progress Notes (Signed)
 Physical Therapy Treatment Patient Details Name: Danielle Zamora MRN: [id number] DOB: 1945/10/12 Today's Date: 04/24/2023   History of Present Illness Pt is a 78 y/o F admitted on 04/21/23 after presenting with c/o chills & congestion. Pt is being treated for sepsis, acute respiratory failure with hypoxia. PMH: HTN, chronic hypercalcemia s/p parathyroidectomy (2023), pernicious anemia, cholelithiasis    PT Comments  Pt alert and oriented to self. Provided conflicting information of PLOF. Today informed PT that she normally needs assistance for bed mobility, utilizes the sit to stand lift (via a sling) to transfer to a WC at her facility. Stated PT works on the bike and leg exercises. Today the pt required maxA to come to sitting on EOB, extended time to complete and verbal/tactile cues to maximize participation. Sit <> stand from elevated surface, stedy, and maxAx2. Anticipate hoyer lift transfers for OOB for mobility, care team updated. Pt in chair with needs in reach. The patient would benefit from further skilled PT intervention to continue to progress towards goals.    If plan is discharge home, recommend the following: Two people to help with walking and/or transfers;Two people to help with bathing/dressing/bathroom;Assistance with cooking/housework   Can travel by private vehicle     No  Equipment Recommendations  None recommended by PT    Recommendations for Other Services       Precautions / Restrictions Precautions Precautions: Fall Restrictions Weight Bearing Restrictions Per Provider Order: No     Mobility  Bed Mobility Overal bed mobility: Needs Assistance Bed Mobility: Supine to Sit     Supine to sit: Max assist, HOB elevated     General bed mobility comments: extra time, cues to maximize pt's participation    Transfers Overall transfer level: Needs assistance   Transfers: Sit to/from Stand Sit to Stand: Via lift equipment, From elevated surface            General transfer comment: maxAx2 to stand from elevated bed surface Transfer via Lift Equipment: Stedy  Ambulation/Gait               General Gait Details: does not ambulate   Stairs             Wheelchair Mobility     Tilt Bed    Modified Rankin (Stroke Patients Only)       Balance Overall balance assessment: Needs assistance Sitting-balance support: Feet supported Sitting balance-Leahy Scale: Fair Sitting balance - Comments: progressed to fair but needed extended time and repositioning   Standing balance support: During functional activity, No upper extremity supported, Reliant on assistive device for balance Standing balance-Leahy Scale: Zero                              Communication    Cognition Arousal: Alert Behavior During Therapy: WFL for tasks assessed/performed                             Following commands: Impaired Following commands impaired: Follows one step commands with increased time    Cueing    Exercises      General Comments        Pertinent Vitals/Pain Pain Assessment Pain Assessment: Faces Faces Pain Scale: Hurts a little bit Pain Location: bilat knees Pain Descriptors / Indicators: Sore Pain Intervention(s): Limited activity within patient's tolerance, Monitored during session, Repositioned    Home Living  Prior Function            PT Goals (current goals can now be found in the care plan section) Progress towards PT goals: Progressing toward goals    Frequency    Min 1X/week      PT Plan      Co-evaluation              AM-PAC PT "6 Clicks" Mobility   Outcome Measure  Help needed turning from your back to your side while in a flat bed without using bedrails?: Total Help needed moving from lying on your back to sitting on the side of a flat bed without using bedrails?: Total Help needed moving to and from a bed to a chair (including a  wheelchair)?: Total Help needed standing up from a chair using your arms (e.g., wheelchair or bedside chair)?: Total Help needed to walk in hospital room?: Total Help needed climbing 3-5 steps with a railing? : Total 6 Click Score: 6    End of Session Equipment Utilized During Treatment: Oxygen Activity Tolerance: Patient limited by fatigue Patient left: in chair;with call bell/phone within reach;with chair alarm set Nurse Communication: Mobility status;Need for lift equipment PT Visit Diagnosis: Muscle weakness (generalized) (M62.81);Other abnormalities of gait and mobility (R26.89);Difficulty in walking, not elsewhere classified (R26.2)     Time: 1610-9604 PT Time Calculation (min) (ACUTE ONLY): 21 min  Charges:    $Therapeutic Activity: 8-22 mins PT General Charges $$ ACUTE PT VISIT: 1 Visit                     Darien Eden PT, DPT 10:10 AM,04/24/23

## 2023-04-24 NOTE — Discharge Summary (Signed)
 Physician Discharge Summary   Patient: Danielle Zamora MRN: [id number] DOB: December 22, 1945  Admit date:     04/21/2023  Discharge date: 04/24/23  Discharge Physician: Ezzard Holms   PCP: Gabriel John, NP   Recommendations at discharge:  Follow-up with primary care physician  Discharge Diagnoses: Aspiration pneumonia, suspected (HCC) Sepsis Acute respiratory failure with hypoxia Elevated troponin Hyperkalemia improved Tachyarrhythmia AKI (acute kidney injury) (HCC) Acute metabolic acidosis Essential hypertension  Hospital Course:   Danielle Zamora is a 78 y.o. female with medical history significant for  HTN, chronic hypercalcemia s/p parathyroidectomy(2023), being admitted with sepsis of unknown source (suspect respiratory) and elevated troponin in the 200s.  She was sent from her facility with a 1 day history of chills and congestion, and a reported choking episode while eating popcorn on the day of arrival.  Patient denies choking on popcorn.  She states that the smell of burnt popcorn being prepared for the facility for movie night causes her to have difficulty breathing.  She does endorse having chills from the day prior On arrival of EMS she was noted to have expiratory wheezing and increased work of breathing and treated with  albuterol, racemic epi and Solu-Medrol. ED course and data review: Tmax 103.1 and tachycardic to the 120s.  BP as high as 199/97, improved to 151/93 by admission.  Initial O2 sat 85% for which she was placed on nasal cannula at 4 L. Labs notable for the following: WBC 11,500 With lactic acid 2.3.  Respiratory viral panel negative for COVID flu and RSV Urinalysis with rare bacteria only Troponin elevated at 253 Creatinine 2.35, up from baseline of 0.77 a year ago, with bicarb of 19 and potassium of 5.7 Patient received antibiotic treatment for sepsis due to possible aspiration pneumonia as well as IV fluid for AKI.  Her respiratory function did improve  significantly and currently back to her baseline.  Underwent assessment by speech therapist and cleared to have dysphagia level 3 diet with thin liquids.  Patient currently ready for discharge back to her facility.   Consultants: Speech therapist, PT OT Procedures performed: None Disposition: Skilled nursing facility Diet recommendation: Dysphagia level 3 diet with thin liquids DISCHARGE MEDICATION: Allergies as of 04/24/2023       Reactions   Penicillins Swelling   Has patient had a PCN reaction causing immediate rash, facial/tongue/throat swelling, SOB or lightheadedness with hypotension: Yes Has patient had a PCN reaction causing severe rash involving mucus membranes or skin necrosis: Yes Has patient had a PCN reaction that required hospitalization: No Has patient had a PCN reaction occurring within the last 10 years: No If all of the above answers are "NO", then may proceed with Cephalosporin use.        Medication List     STOP taking these medications    atorvastatin 40 MG tablet Commonly known as: LIPITOR   colchicine 0.6 MG tablet   furosemide 20 MG tablet Commonly known as: LASIX   meloxicam 15 MG tablet Commonly known as: MOBIC       TAKE these medications    acetaminophen 650 MG CR tablet Commonly known as: TYLENOL Take 650 mg by mouth 2 (two) times daily.   allopurinol 100 MG tablet Commonly known as: Zyloprim Take 1 tablet (100 mg total) by mouth daily. For gout prevention   cephALEXin 500 MG capsule Commonly known as: KEFLEX Take 1 capsule (500 mg total) by mouth 2 (two) times daily for 4 days.   cetirizine 10  MG tablet Commonly known as: ZYRTEC Take 10 mg by mouth daily.   cholecalciferol 25 MCG (1000 UNIT) tablet Commonly known as: VITAMIN D3 Take 1,000 Units by mouth daily.   cyanocobalamin 500 MCG tablet Commonly known as: VITAMIN B12 Take 500 mcg by mouth daily.   diclofenac Sodium 1 % Gel Commonly known as: VOLTAREN Apply 4 g  topically 2 (two) times daily. Apply 4 grams to right knee   famotidine 20 MG tablet Commonly known as: PEPCID Take 1 tablet (20 mg total) by mouth daily. Start taking on: April 25, 2023   gabapentin 300 MG capsule Commonly known as: NEURONTIN Take 1 capsule (300 mg total) by mouth at bedtime. For pain and sleep   gabapentin 100 MG capsule Commonly known as: NEURONTIN Take 1 capsule (100 mg total) by mouth daily. For pain   irbesartan 300 MG tablet Commonly known as: AVAPRO Take 300 mg by mouth daily.   lidocaine 5 % Commonly known as: LIDODERM APPLY 1 PATCH ON SKIN EVERY 12 HOURS THEN REMOVE FOR 12 HOURS What changed: See the new instructions.   methocarbamol 500 MG tablet Commonly known as: ROBAXIN Take 1 tablet (500 mg total) by mouth every 8 (eight) hours as needed for muscle spasms. What changed: when to take this   nystatin powder Commonly known as: MYCOSTATIN/NYSTOP Apply 1 Application topically 2 (two) times daily as needed. What changed: Another medication with the same name was removed. Continue taking this medication, and follow the directions you see here.   ondansetron 4 MG tablet Commonly known as: ZOFRAN Take 1 tablet (4 mg total) by mouth every 6 (six) hours as needed for nausea.   polyethylene glycol 17 g packet Commonly known as: MIRALAX / GLYCOLAX Take 17 g by mouth daily. Start taking on: April 25, 2023   pravastatin 40 MG tablet Commonly known as: PRAVACHOL Take 40 mg by mouth at bedtime.   senna-docusate 8.6-50 MG tablet Commonly known as: Senokot-S Take 1 tablet by mouth 2 (two) times daily.   traMADol 50 MG tablet Commonly known as: ULTRAM Take 1 tablet (50 mg total) by mouth every 6 (six) hours as needed. What changed:  when to take this reasons to take this Another medication with the same name was removed. Continue taking this medication, and follow the directions you see here.   triamcinolone cream 0.1 % Commonly known as:  KENALOG Apply 1 Application topically 2 (two) times daily as needed (apply to chest for itching).   Zinc Oxide 16 % Oint Apply 1 application  topically 2 (two) times daily. Apply small amount to buttocks twice a day with peri care        Discharge Exam: Filed Weights   04/22/23 0122  Weight: 70.3 kg   Vitals and nursing note reviewed.  HENT:     Head: Normocephalic and atraumatic.  Cardiovascular:     Heart sounds: Normal heart sounds.  Pulmonary: .     Breath sounds: Normal breath sounds.  Abdominal:     Palpations: Abdomen is soft.     Tenderness: There is no abdominal tenderness.  Neurological:     Mental Status: Mental status is at baseline.   Condition at discharge: good  The results of significant diagnostics from this hospitalization (including imaging, microbiology, ancillary and laboratory) are listed below for reference.   Imaging Studies: ECHOCARDIOGRAM COMPLETE Result Date: 04/22/2023    ECHOCARDIOGRAM REPORT   Patient Name:   CAY KATH Date of Exam: 04/22/2023 Medical Rec #:  [id number]     Height:       63.0 in Accession #:    [id number]    Weight:       255.0 lb Date of Birth:  14-Sep-1945     BSA:          2.144 m Patient Age:    77 years      BP:           135/64 mmHg Patient Gender: F             HR:           83 bpm. Exam Location:  [facility] Procedure: 2D Echo, Cardiac Doppler, Color Doppler and Intracardiac            Opacification Agent (Both Spectral and Color Flow Doppler were            utilized during procedure). Indications:     Elevated Troponin  History:         Patient has prior history of Echocardiogram examinations. Risk                  Factors:Hypertension.  Sonographer:     Kathaleen Pale Roar Referring Phys:  [id number] Lanetta Pion Diagnosing Phys: Sheryle Donning MD IMPRESSIONS  1. Left ventricular ejection fraction, by estimation, is >75%. The left ventricle has hyperdynamic function. The left ventricle has no regional wall motion abnormalities.  There is mild concentric left ventricular hypertrophy. Left ventricular diastolic parameters are indeterminate.  2. Right ventricular systolic function is normal. The right ventricular size is normal. There is mildly elevated pulmonary artery systolic pressure.  3. The mitral valve was not well visualized. Trivial mitral valve regurgitation. No evidence of mitral stenosis.  4. The aortic valve was not well visualized. There is mild calcification of the aortic valve. There is mild thickening of the aortic valve. Aortic valve regurgitation is not visualized. Aortic valve sclerosis/calcification is present, without any evidence of aortic stenosis.  5. The inferior vena cava is normal in size with greater than 50% respiratory variability, suggesting right atrial pressure of 3 mmHg. Comparison(s): No significant change from prior study. FINDINGS  Left Ventricle: Left ventricular ejection fraction, by estimation, is >75%. The left ventricle has hyperdynamic function. The left ventricle has no regional wall motion abnormalities. Definity contrast agent was given IV to delineate the left ventricular endocardial borders. The left ventricular internal cavity size was normal in size. There is mild concentric left ventricular hypertrophy. Left ventricular diastolic parameters are indeterminate. Right Ventricle: The right ventricular size is normal. Right vetricular wall thickness was not well visualized. Right ventricular systolic function is normal. There is mildly elevated pulmonary artery systolic pressure. The tricuspid regurgitant velocity  is 3.13 m/s, and with an assumed right atrial pressure of 3 mmHg, the estimated right ventricular systolic pressure is 42.2 mmHg. Left Atrium: Left atrial size was normal in size. Right Atrium: Right atrial size was normal in size. Pericardium: There is no evidence of pericardial effusion. Mitral Valve: The mitral valve was not well visualized. Trivial mitral valve regurgitation. No  evidence of mitral valve stenosis. MV peak gradient, 8.0 mmHg. The mean mitral valve gradient is 3.0 mmHg. Tricuspid Valve: The tricuspid valve is not well visualized. Tricuspid valve regurgitation is trivial. No evidence of tricuspid stenosis. Aortic Valve: The aortic valve was not well visualized. There is mild calcification of the aortic valve. There is mild thickening of the aortic valve. Aortic valve regurgitation is not visualized. Aortic valve sclerosis/calcification  is present, without any evidence of aortic stenosis. Aortic valve mean gradient measures 5.0 mmHg. Aortic valve peak gradient measures 8.5 mmHg. Aortic valve area, by VTI measures 2.73 cm. Pulmonic Valve: The pulmonic valve was not well visualized. Pulmonic valve regurgitation is not visualized. No evidence of pulmonic stenosis. Aorta: The aortic root, ascending aorta and aortic arch are all structurally normal, with no evidence of dilitation or obstruction. Venous: The inferior vena cava is normal in size with greater than 50% respiratory variability, suggesting right atrial pressure of 3 mmHg. IAS/Shunts: The atrial septum is grossly normal.  LEFT VENTRICLE PLAX 2D LVIDd:         5.10 cm   Diastology LVIDs:         3.50 cm   LV e' medial:    7.94 cm/s LV PW:         1.10 cm   LV E/e' medial:  7.1 LV IVS:        1.30 cm   LV e' lateral:   7.83 cm/s LVOT diam:     1.80 cm   LV E/e' lateral: 7.2 LV SV:         77 LV SV Index:   36 LVOT Area:     2.54 cm  RIGHT VENTRICLE             IVC RV Basal diam:  2.40 cm     IVC diam: 1.82 cm RV Mid diam:    2.80 cm RV S prime:     12.90 cm/s TAPSE (M-mode): 2.0 cm LEFT ATRIUM           Index        RIGHT ATRIUM           Index LA diam:      3.90 cm 1.82 cm/m   RA Area:     11.70 cm LA Vol (A4C): 50.9 ml 23.74 ml/m  RA Volume:   22.80 ml  10.63 ml/m  AORTIC VALVE                     PULMONIC VALVE AV Area (Vmax):    2.46 cm      PV Vmax:        1.36 m/s AV Area (Vmean):   2.22 cm      PV Peak grad:    7.4 mmHg AV Area (VTI):     2.73 cm      RVOT Peak grad: 3 mmHg AV Vmax:           146.00 cm/s AV Vmean:          101.000 cm/s AV VTI:            0.283 m AV Peak Grad:      8.5 mmHg AV Mean Grad:      5.0 mmHg LVOT Vmax:         141.00 cm/s LVOT Vmean:        88.000 cm/s LVOT VTI:          0.304 m LVOT/AV VTI ratio: 1.07  AORTA Ao Root diam: 2.60 cm Ao Asc diam:  3.70 cm MITRAL VALVE                TRICUSPID VALVE MV Area (PHT): 3.74 cm     TR Peak grad:   39.2 mmHg MV Area VTI:   3.57 cm     TR Vmax:        313.00 cm/s MV  Peak grad:  8.0 mmHg MV Mean grad:  3.0 mmHg     SHUNTS MV Vmax:       1.41 m/s     Systemic VTI:  0.30 m MV Vmean:      81.7 cm/s    Systemic Diam: 1.80 cm MV Decel Time: 203 msec MV E velocity: 56.10 cm/s MV A velocity: 125.00 cm/s MV E/A ratio:  0.45 MV A Prime:    13.7 cm/s Sheryle Donning MD Electronically signed by Sheryle Donning MD Signature Date/Time: 04/22/2023/3:07:14 PM    Final    DG Chest 2 View Result Date: 04/21/2023 CLINICAL DATA:  Concern for sepsis. EXAM: CHEST - 2 VIEW COMPARISON:  Chest radiograph dated May 25, 2021. FINDINGS: Low lung volumes. Stable mild cardiomegaly. Aortic atherosclerosis. No focal consolidation, pleural effusion, or pneumothorax. Degenerative changes of the thoracic spine. No acute osseous abnormality. IMPRESSION: Low lung volumes.  No acute cardiopulmonary findings. Electronically Signed   By: Mannie Seek M.D.   On: 04/21/2023 19:40    Microbiology: Results for orders placed or performed during the hospital encounter of 04/21/23  Culture, blood (Routine x 2)     Status: Abnormal (Preliminary result)   Collection Time: 04/21/23  6:24 PM   Specimen: BLOOD  Result Value Ref Range Status   Specimen Description   Final    BLOOD RIGHT ANTECUBITAL Performed at [facility], [address], [place], [place] [address]    Special Requests   Final    BOTTLES DRAWN AEROBIC AND ANAEROBIC Blood Culture adequate  volume Performed at [facility], [address] [place], [place] [address]    Culture  Setup Time   Final    GRAM POSITIVE COCCI AEROBIC BOTTLE ONLY CRITICAL RESULT CALLED TO, READ BACK BY AND VERIFIED WITH: [facility] PATEL AT 0981 04/22/23.PMF Performed at [facility], [address], [place], [place] [address]    Culture STREPTOCOCCUS GROUP G SUSCEPTIBILITIES TO FOLLOW (A)  Final   Report Status PENDING  Incomplete  Resp panel by RT-PCR (RSV, Flu A&B, Covid) Anterior Nasal Swab     Status: None   Collection Time: 04/21/23  6:24 PM   Specimen: Anterior Nasal Swab  Result Value Ref Range Status   SARS Coronavirus 2 by RT PCR NEGATIVE NEGATIVE Final    Comment: (NOTE) SARS-CoV-2 target nucleic acids are NOT DETECTED.  The SARS-CoV-2 RNA is generally detectable in upper respiratory specimens during the acute phase of infection. The lowest concentration of SARS-CoV-2 viral copies this assay can detect is 138 copies/mL. A negative result does not preclude SARS-Cov-2 infection and should not be used as the sole basis for treatment or other patient management decisions. A negative result may occur with  improper specimen collection/handling, submission of specimen other than nasopharyngeal swab, presence of viral mutation(s) within the areas targeted by this assay, and inadequate number of viral copies(<138 copies/mL). A negative result must be combined with clinical observations, patient history, and epidemiological information. The expected result is Negative.  Fact Sheet for Patients:  BloggerCourse.com  Fact Sheet for Healthcare Providers:  SeriousBroker.it  This test is no t yet approved or cleared by the [place]  FDA and  has been authorized for detection and/or diagnosis of SARS-CoV-2 by FDA under an Emergency Use Authorization (EUA). This EUA will remain  in effect (meaning this test can be used) for the  duration of the COVID-19 declaration under Section 564(b)(1) of the Act, 21 U.S.C.section 360bbb-3(b)(1), unless the authorization  is terminated  or revoked sooner.       Influenza A by PCR NEGATIVE NEGATIVE Final   Influenza B by PCR NEGATIVE NEGATIVE Final    Comment: (NOTE) The Xpert Xpress SARS-CoV-2/FLU/RSV plus assay is intended as an aid in the diagnosis of influenza from Nasopharyngeal swab specimens and should not be used as a sole basis for treatment. Nasal washings and aspirates are unacceptable for Xpert Xpress SARS-CoV-2/FLU/RSV testing.  Fact Sheet for Patients: BloggerCourse.com  Fact Sheet for Healthcare Providers: SeriousBroker.it  This test is not yet approved or cleared by the [place]  FDA and has been authorized for detection and/or diagnosis of SARS-CoV-2 by FDA under an Emergency Use Authorization (EUA). This EUA will remain in effect (meaning this test can be used) for the duration of the COVID-19 declaration under Section 564(b)(1) of the Act, 21 U.S.C. section 360bbb-3(b)(1), unless the authorization is terminated or revoked.     Resp Syncytial Virus by PCR NEGATIVE NEGATIVE Final    Comment: (NOTE) Fact Sheet for Patients: BloggerCourse.com  Fact Sheet for Healthcare Providers: SeriousBroker.it  This test is not yet approved or cleared by the [place]  FDA and has been authorized for detection and/or diagnosis of SARS-CoV-2 by FDA under an Emergency Use Authorization (EUA). This EUA will remain in effect (meaning this test can be used) for the duration of the COVID-19 declaration under Section 564(b)(1) of the Act, 21 U.S.C. section 360bbb-3(b)(1), unless the authorization is terminated or revoked.  Performed at [facility], [address] [place], [place] [address]   Blood Culture ID Panel (Reflexed)     Status: Abnormal    Collection Time: 04/21/23  6:24 PM  Result Value Ref Range Status   Enterococcus faecalis NOT DETECTED NOT DETECTED Final   Enterococcus Faecium NOT DETECTED NOT DETECTED Final   Listeria monocytogenes NOT DETECTED NOT DETECTED Final   Staphylococcus species NOT DETECTED NOT DETECTED Final   Staphylococcus aureus (BCID) NOT DETECTED NOT DETECTED Final   Staphylococcus epidermidis NOT DETECTED NOT DETECTED Final   Staphylococcus lugdunensis NOT DETECTED NOT DETECTED Final   Streptococcus species DETECTED (A) NOT DETECTED Final    Comment: Not Enterococcus species, Streptococcus agalactiae, Streptococcus pyogenes, or Streptococcus pneumoniae. CRITICAL RESULT CALLED TO, READ BACK BY AND VERIFIED WITH: Theora Flair PATEL AT 6045 04/22/23.PMF    Streptococcus agalactiae NOT DETECTED NOT DETECTED Final   Streptococcus pneumoniae NOT DETECTED NOT DETECTED Final   Streptococcus pyogenes NOT DETECTED NOT DETECTED Final   A.calcoaceticus-baumannii NOT DETECTED NOT DETECTED Final   Bacteroides fragilis NOT DETECTED NOT DETECTED Final   Enterobacterales NOT DETECTED NOT DETECTED Final   Enterobacter cloacae complex NOT DETECTED NOT DETECTED Final   Escherichia coli NOT DETECTED NOT DETECTED Final   Klebsiella aerogenes NOT DETECTED NOT DETECTED Final   Klebsiella oxytoca NOT DETECTED NOT DETECTED Final   Klebsiella pneumoniae NOT DETECTED NOT DETECTED Final   Proteus species NOT DETECTED NOT DETECTED Final   Salmonella species NOT DETECTED NOT DETECTED Final   Serratia marcescens NOT DETECTED NOT DETECTED Final   Haemophilus influenzae NOT DETECTED NOT DETECTED Final   Neisseria meningitidis NOT DETECTED NOT DETECTED Final   Pseudomonas aeruginosa NOT DETECTED NOT DETECTED Final   Stenotrophomonas maltophilia NOT DETECTED NOT DETECTED Final   Candida albicans NOT DETECTED NOT DETECTED Final   Candida auris NOT DETECTED NOT DETECTED Final   Candida glabrata NOT DETECTED NOT DETECTED Final   Candida  krusei NOT DETECTED NOT DETECTED Final   Candida parapsilosis NOT  DETECTED NOT DETECTED Final   Candida tropicalis NOT DETECTED NOT DETECTED Final   Cryptococcus neoformans/gattii NOT DETECTED NOT DETECTED Final    Comment: Performed at [facility], [address] [place], [place] [address]  Culture, blood (Routine x 2)     Status: None (Preliminary result)   Collection Time: 04/21/23  9:18 PM   Specimen: BLOOD RIGHT ARM  Result Value Ref Range Status   Specimen Description BLOOD RIGHT ARM  Final   Special Requests   Final    BOTTLES DRAWN AEROBIC AND ANAEROBIC Blood Culture adequate volume   Culture   Final    NO GROWTH 3 DAYS Performed at [facility], [address] [place], [place] [address]    Report Status PENDING  Incomplete    Labs: CBC: Recent Labs  Lab 04/21/23 1819 04/22/23 2018 04/23/23 0555 04/24/23 0411  WBC 11.5* 11.6* 9.4 12.5*  NEUTROABS 9.9* 9.1* 6.6 9.3*  HGB 11.9* 10.6* 10.2* 11.4*  HCT 36.7 32.7* 31.5* 35.5*  MCV 92.0 90.3 90.5 90.1  PLT 183 177 161 183   Basic Metabolic Panel: Recent Labs  Lab 04/21/23 2322 04/22/23 0325 04/22/23 2018 04/23/23 0555 04/24/23 0411  NA 134* 135 139 139 141  K 6.2* 5.9* 4.9 4.6 5.0  CL 106 108 108 110 110  CO2 19* 20* 21* 20* 22  GLUCOSE 167* 190* 163* 108* 107*  BUN 40* 41* 43* 40* 37*  CREATININE 2.01* 1.84* 1.63* 1.44* 1.34*  CALCIUM 7.7* 8.0* 8.2* 8.1* 8.6*   Liver Function Tests: Recent Labs  Lab 04/21/23 1818  AST 29  ALT 18  ALKPHOS 99  BILITOT 1.2  PROT 8.5*  ALBUMIN 3.8   CBG: No results for input(s): "GLUCAP" in the last 168 hours.  Discharge time spent:  36 minutes.  Signed: Ezzard Holms, MD [facility] 04/24/2023

## 2023-04-24 NOTE — Progress Notes (Signed)
 Anticoagulation monitoring(Lovenox):  78 yo  female ordered Lovenox 40 mg Q24h    Filed Weights   04/22/23 0122  Weight: 70.3 kg (155 lb)   BMI 27.5    Lab Results  Component Value Date   CREATININE 1.34 (H) 04/24/2023   CREATININE 1.44 (H) 04/23/2023   CREATININE 1.63 (H) 04/22/2023   Estimated Creatinine Clearance: 32.6 mL/min (A) (by C-G formula based on SCr of 1.34 mg/dL (H)). Hemoglobin & Hematocrit     Component Value Date/Time   HGB 11.4 (L) 04/24/2023 0411   HCT 35.5 (L) 04/24/2023 0411     Per Protocol for Patient with estCrcl now> 30 ml/min and BMI < 30, will transition to Lovenox 40 mg Q24h.     Thomasine Flick PharmD Clinical Pharmacist 04/24/2023

## 2023-04-24 NOTE — [place] (Signed)
 [place] MEDICA[place]2 LEVEL OF CARE FORM     IDENTIFICATION  Patient Name: Danielle Zamora Birthdate: 1945-08-13 Sex: female Admission Date (Current Location): 04/21/2023  [facility] and [place] Number:  Chiropodist and Address:  [facility], [address], [place], [place] [address]      Provider Number: [id number]  Attending Physician Name and Address:  Loyce Dys, MD  Relative Name and Phone Number:       Current Level of Care: Hospital Recommended Level of Care: Skilled Nursing Facility Prior Approval Number:    Date Approved/Denied:   PASRR Number: [id number]  Discharge Plan: SNF    Current Diagnoses: Patient Active Problem List   Diagnosis Date Noted   Sepsis (HCC) 04/21/2023   Elevated troponin 04/21/2023   Aspiration pneumonia, suspected (HCC) 04/21/2023   Acute respiratory failure with hypoxia (HCC) 04/21/2023   AKI (acute kidney injury) (HCC) 04/21/2023   Acute metabolic acidosis 04/21/2023   Tachyarrhythmia 04/21/2023   Rash and nonspecific skin eruption 02/10/2022   Recurrent falls 10/08/2021   Chronic pain of both knees 10/08/2021   Elevated bilirubin    Elevated LFTs 05/24/2021   Anemia due to chronic kidney disease 05/11/2021   Hyperkalemia 05/11/2021   Aortic atherosclerosis (HCC) 02/24/2021   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   Gout 11/25/2019   Hyperlipidemia 11/25/2019   Osteoporosis 01/21/2019   Vitamin D deficiency 01/21/2019   Tobacco abuse 01/21/2019   Prediabetes 01/21/2019   CKD (chronic kidney disease) stage 4, GFR 15-29 ml/min (HCC) 10/09/2017   Hyperparathyroidism (HCC) 10/09/2017   Essential hypertension 08/31/2016   Pernicious anemia 08/11/2016    Orientation RESPIRATION BLADDER Height & Weight     Self, Time, Situation, Place  Normal Incontinent, External catheter Weight: 155 lb (70.3 kg) Height:  5\' 3"  (160 cm)  BEHAVIORAL SYMPTOMS/MOOD NEUROLOGICAL BOWEL NUTRITION STATUS   (None)  (None)  Continent Diet (DYS 3)  AMBULATORY STATUS COMMUNICATION OF NEEDS Skin   Extensive Assist Verbally Other (Comment) (Erythema/redness.)                       Personal Care Assistance Level of Assistance  Bathing, Feeding, Dressing Bathing Assistance: Maximum assistance Feeding assistance: Limited assistance Dressing Assistance: Maximum assistance     Functional Limitations Info  Sight, Hearing, Speech Sight Info: Adequate Hearing Info: Adequate Speech Info: Adequate    SPECIAL CARE FACTORS FREQUENCY  PT (By licensed PT), OT (By licensed OT)     PT Frequency: 3 x weel OT Frequency: 3 x weel            Contractures Contractures Info: Not present    Additional Factors Info  Code Status, Allergies Code Status Info: Full code Allergies Info: Penicillins           Current Medications (04/24/2023):  This is the current hospital active medication list Current Facility-Administered Medications  Medication Dose Route Frequency Provider Last Rate Last Admin   acetaminophen (TYLENOL) tablet 650 mg  650 mg Oral Q6H PRN Andris Baumann, MD       Or   acetaminophen (TYLENOL) suppository 650 mg  650 mg Rectal Q6H PRN Andris Baumann, MD       albuterol (PROVENTIL) (2.5 MG/3ML) 0.083% nebulizer solution 2.5 mg  2.5 mg Nebulization Q2H PRN Andris Baumann, MD       azithromycin [facility]) tablet 500 mg  500 mg Oral Daily Rosezetta Schlatter T, MD   500 mg at 04/23/23  2247   cefTRIAXone (ROCEPHIN) 2 g in sodium chloride 0.9 % 100 mL IVPB  2 g Intravenous Q24H Duncan, Hazel V, MD 200 mL/hr at 04/24/23 0820 2 g at 04/24/23 0820   [START ON 04/25/2023] enoxaparin (LOVENOX) injection 40 mg  40 mg Subcutaneous Q24H Merrill, Kristin A, RPH       famotidine (PEPCID) tablet 20 mg  20 mg Oral Daily Djan, Prince T, MD   20 mg at 04/24/23 0816   feeding supplement (ENSURE ENLIVE / ENSURE PLUS) liquid 237 mL  237 mL Oral BID BM Alvenia Aus T, MD   237 mL at 04/24/23 0817   gabapentin (NEURONTIN)  capsule 100 mg  100 mg Oral Daily Djan, Prince T, MD   100 mg at 04/24/23 0816   irbesartan (AVAPRO) tablet 300 mg  300 mg Oral Daily Alvenia Aus T, MD   300 mg at 04/23/23 1715   multivitamin with minerals tablet 1 tablet  1 tablet Oral Daily Ezzard Holms, MD   1 tablet at 04/24/23 0816   ondansetron (ZOFRAN) tablet 4 mg  4 mg Oral Q6H PRN Duncan, Hazel V, MD       Or   ondansetron (ZOFRAN) injection 4 mg  4 mg Intravenous Q6H PRN Duncan, Hazel V, MD       polyethylene glycol (MIRALAX / GLYCOLAX) packet 17 g  17 g Oral Daily Djan, Prince T, MD   17 g at 04/23/23 1715   pravastatin (PRAVACHOL) tablet 40 mg  40 mg Oral QHS Djan, Prince T, MD   40 mg at 04/23/23 2247   predniSONE (DELTASONE) tablet 40 mg  40 mg Oral Q breakfast Duncan, Hazel V, MD   40 mg at 04/24/23 0816   traMADol (ULTRAM) tablet 50 mg  50 mg Oral Daily Djan, Prince T, MD   50 mg at 04/24/23 1308     Discharge Medications: Please see discharge summary for a list of discharge medications.  Relevant Imaging Results:  Relevant Lab Results:   Additional Information SS#: [id number]  Odilia Bennett, LCSW

## 2023-04-24 NOTE — Plan of Care (Signed)
  Problem: Fluid Volume: Goal: Hemodynamic stability will improve Outcome: Adequate for Discharge   Problem: Clinical Measurements: Goal: Diagnostic test results will improve Outcome: Adequate for Discharge Goal: Signs and symptoms of infection will decrease Outcome: Adequate for Discharge   Problem: Respiratory: Goal: Ability to maintain adequate ventilation will improve Outcome: Adequate for Discharge   Problem: Education: Goal: Knowledge of General Education information will improve Description: Including pain rating scale, medication(s)/side effects and non-pharmacologic comfort measures Outcome: Adequate for Discharge   Problem: Health Behavior/Discharge Planning: Goal: Ability to manage health-related needs will improve Outcome: Adequate for Discharge   Problem: Clinical Measurements: Goal: Ability to maintain clinical measurements within normal limits will improve Outcome: Adequate for Discharge Goal: Will remain free from infection Outcome: Adequate for Discharge Goal: Diagnostic test results will improve Outcome: Adequate for Discharge Goal: Respiratory complications will improve Outcome: Adequate for Discharge Goal: Cardiovascular complication will be avoided Outcome: Adequate for Discharge   Problem: Activity: Goal: Risk for activity intolerance will decrease Outcome: Adequate for Discharge   Problem: Nutrition: Goal: Adequate nutrition will be maintained Outcome: Adequate for Discharge   Problem: Coping: Goal: Level of anxiety will decrease Outcome: Adequate for Discharge   Problem: Elimination: Goal: Will not experience complications related to bowel motility Outcome: Adequate for Discharge Goal: Will not experience complications related to urinary retention Outcome: Adequate for Discharge   Problem: Pain Managment: Goal: General experience of comfort will improve and/or be controlled Outcome: Adequate for Discharge   Problem: Safety: Goal:  Ability to remain free from injury will improve Outcome: Adequate for Discharge   Problem: Skin Integrity: Goal: Risk for impaired skin integrity will decrease Outcome: Adequate for Discharge   Problem: Education: Goal: Knowledge of disease or condition will improve Outcome: Adequate for Discharge Goal: Knowledge of the prescribed therapeutic regimen will improve Outcome: Adequate for Discharge Goal: Individualized Educational Video(s) Outcome: Adequate for Discharge   Problem: Activity: Goal: Ability to tolerate increased activity will improve Outcome: Adequate for Discharge Goal: Will verbalize the importance of balancing activity with adequate rest periods Outcome: Adequate for Discharge   Problem: Respiratory: Goal: Ability to maintain a clear airway will improve Outcome: Adequate for Discharge Goal: Levels of oxygenation will improve Outcome: Adequate for Discharge Goal: Ability to maintain adequate ventilation will improve Outcome: Adequate for Discharge

## 2023-04-26 LAB — CULTURE, BLOOD (ROUTINE X 2)
Culture: NO GROWTH
Special Requests: ADEQUATE

## 2023-05-30 ENCOUNTER — Encounter (INDEPENDENT_AMBULATORY_CARE_PROVIDER_SITE_OTHER): Payer: Self-pay

## 2023-10-23 IMAGING — CT CT CHEST LUNG CANCER SCREENING LOW DOSE W/O CM
2 of 5 series · 15 of 40 positions shown, 18 images · non-contrast
Comparison: 02/26/2020

CLINICAL DATA: Lung cancer screening. Forty-three pack-year
history. Current asymptomatic smoker.



[Series 3: lung 1.00 · axial · 0.69mm/px · z∈[-1172,-910]mm · 12 of 290 slices shown, 15 images]
[im 14/290  mediastinal]
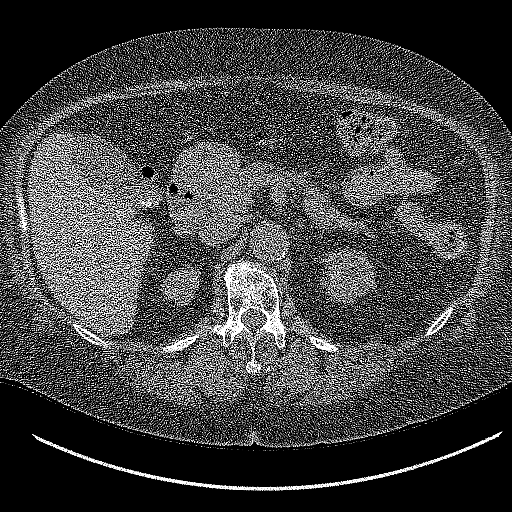
[im 14/290  lung]
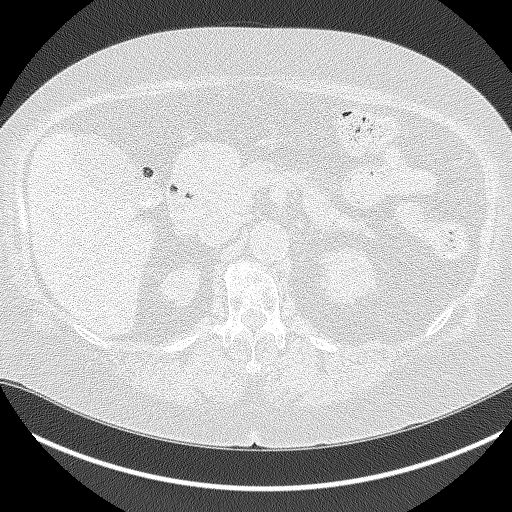
[im 40/290  lung]
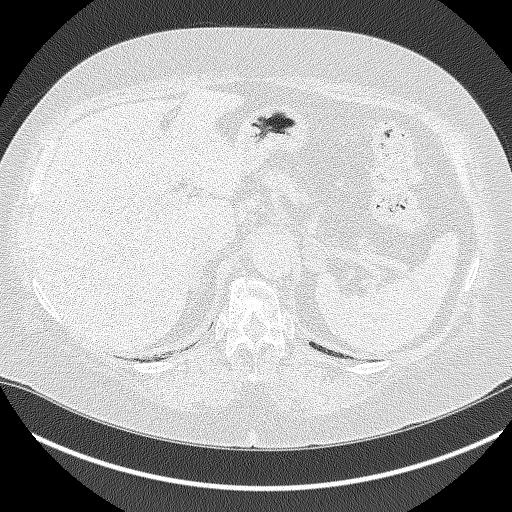
[im 66/290  lung]
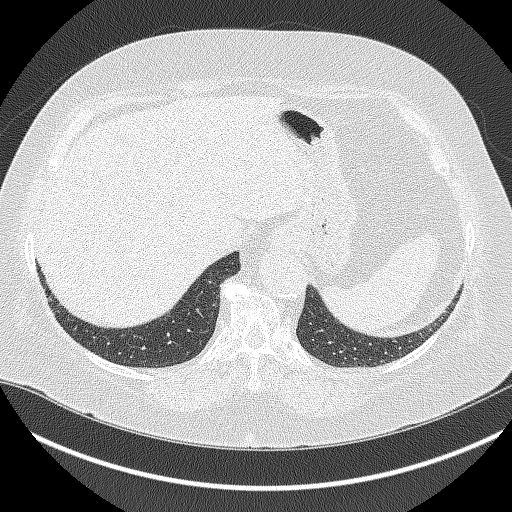
[im 92/290  lung]
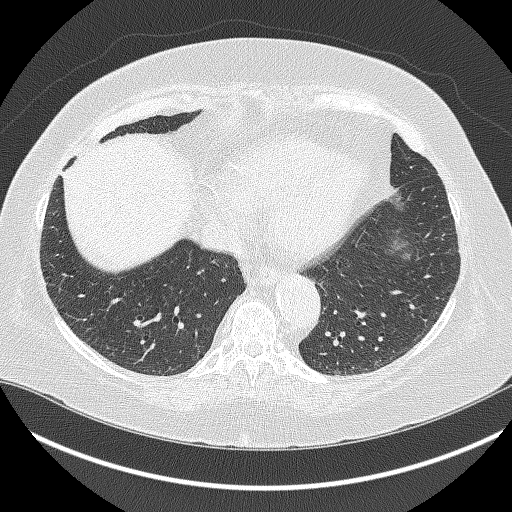
[im 106/290  mediastinal]
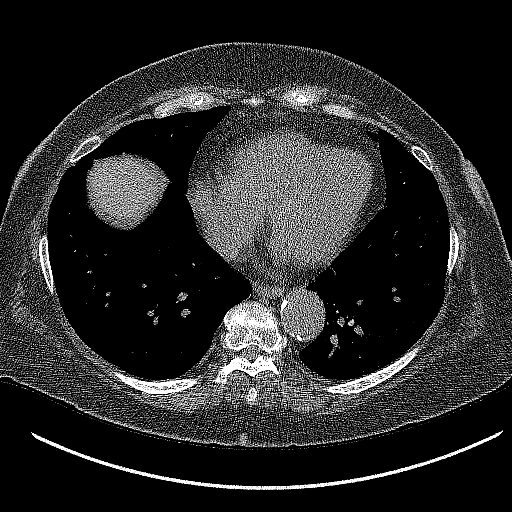
[im 106/290  lung]
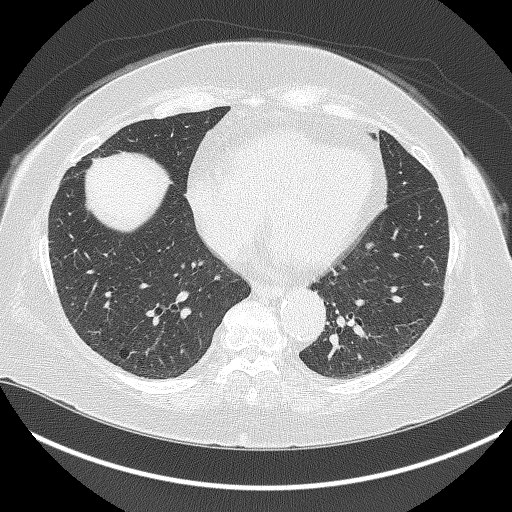
[im 132/290  lung]
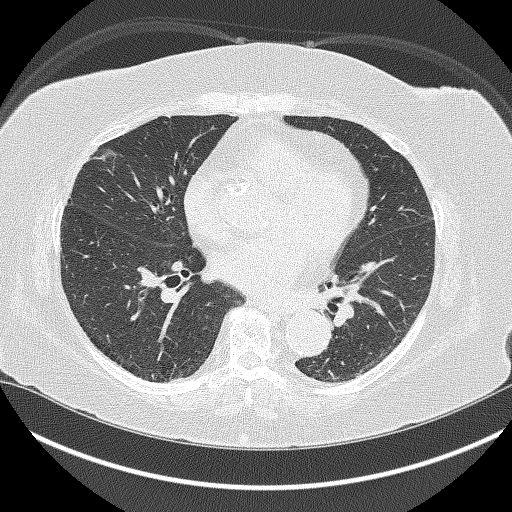
[im 158/290  lung]
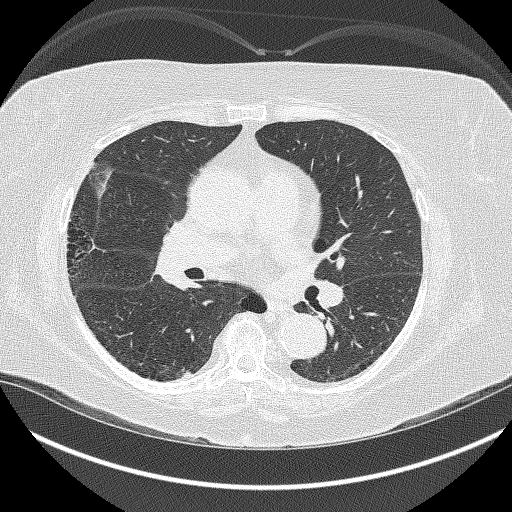
[im 184/290  lung]
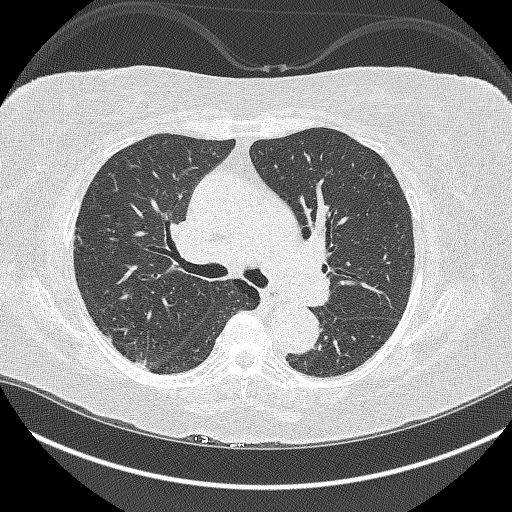
[im 198/290  mediastinal]
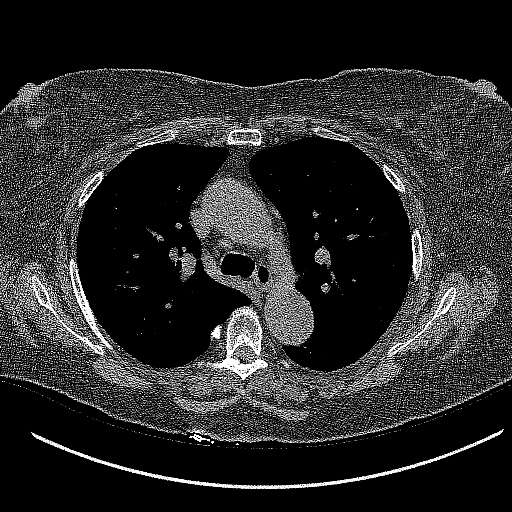
[im 198/290  lung]
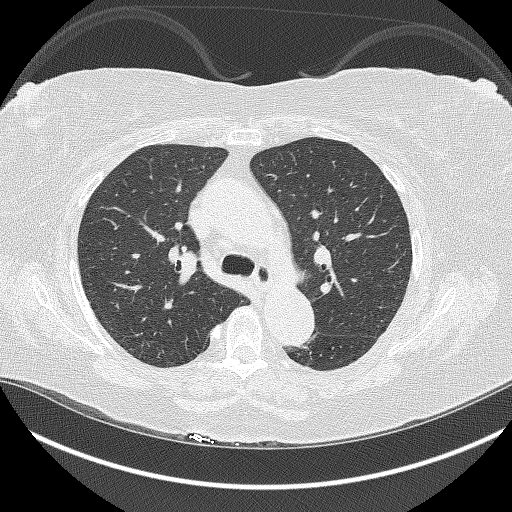
[im 224/290  lung]
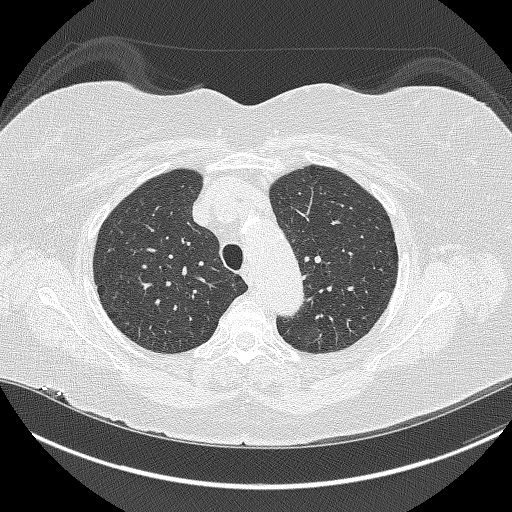
[im 250/290  lung]
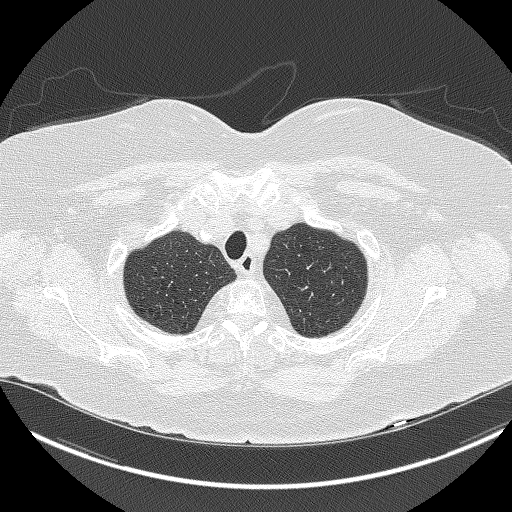
[im 276/290  lung]
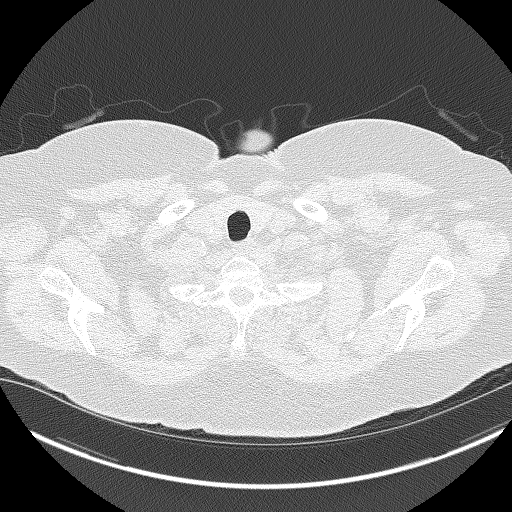

[Series 5: coronals lung 1.00 cor · coronal · 0.57mm/px · 3 of 285 slices shown]
[im 57/285  lung]
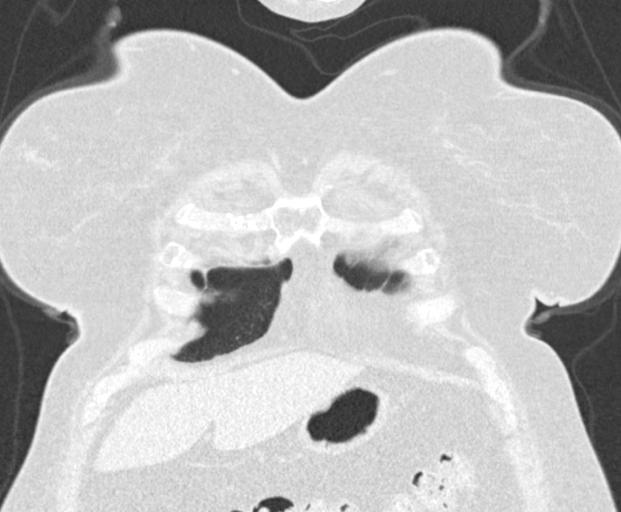
[im 114/285  lung]
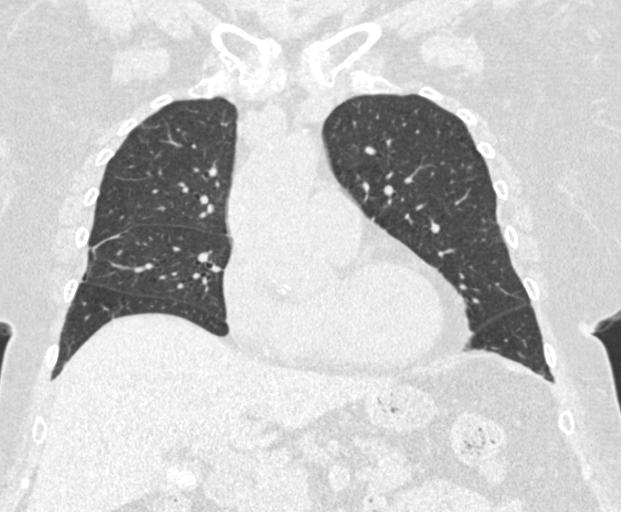
[im 171/285  lung]
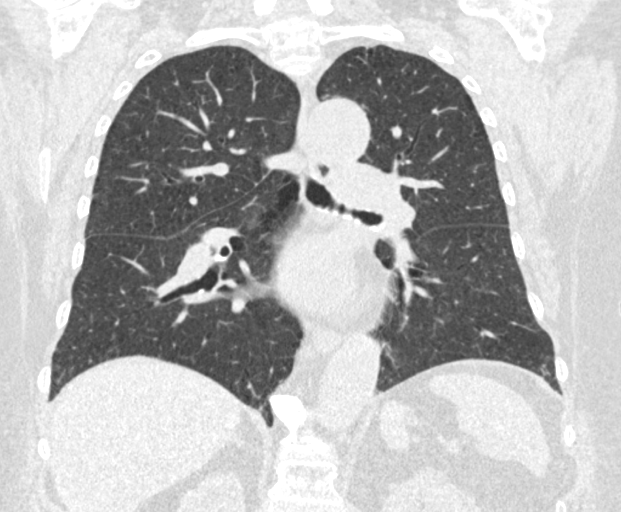

[15 of 40 positions shown; findings below may reference images not displayed]

FINDINGS: Cardiovascular: Heart size appears within normal limits. Aortic
atherosclerosis and coronary artery calcifications. The ascending
thoracic aorta measures 4.2 cm, image 115/5. Unchanged from previous
exam. No pericardial effusion.

Mediastinum/Nodes: No enlarged mediastinal, hilar, or axillary lymph
nodes. Thyroid gland, trachea, and esophagus demonstrate no
significant findings.

Lungs/Pleura: Moderate centrilobular and paraseptal emphysema. No
pleural effusion or airspace consolidation. Scarring with
architectural distortion is again identified within the right middle
lobe and appears unchanged in the interval. Previously noted right
lung nodules are unchanged in the interval measuring up to 4.6 mm.
No new or suspicious lung nodules identified at this time.

Upper Abdomen: No acute findings. Gallstones. Unchanged appearance
of low-attenuation left adrenal nodule measuring 2.3 cm, image 52/2.
This is compatible with a benign adenoma. No follow-up recommended

Musculoskeletal: No acute findings. Thoracic degenerative disc
disease.
IMPRESSION: 1. Lung-RADS 2, benign appearance or behavior. Continue annual
screening with low-dose chest CT without contrast in 12 months.
2. Gallstones.
3. Coronary artery calcifications.
4. Stable aneurysmal dilatation of the ascending thoracic aorta
measuring 4.2 cm. Recommend annual imaging followup by CTA or MRA.
This recommendation follows 1121
ACCF/AHA/AATS/ACR/ASA/SCA/SOLAR/LI MEI/HUCENTIAGO/DYLON Guidelines for the
Diagnosis and Management of Patients with Thoracic Aortic Disease.
Circulation. 1121; 121: [id number]. Aortic aneurysm NOS (JPXMU-XHP.P)
none
5. Aortic Atherosclerosis (JPXMU-H02.2) and Emphysema (JPXMU-ZCE.H).
